# Patient Record
Sex: Male | Born: 1945
Health system: Southern US, Community
[De-identification: ages and names within clinical notes are randomized; demographics above are authoritative.]

## PROBLEM LIST (undated history)

## (undated) DIAGNOSIS — E785 Hyperlipidemia, unspecified: Secondary | ICD-10-CM

## (undated) DIAGNOSIS — T7840XA Allergy, unspecified, initial encounter: Secondary | ICD-10-CM

## (undated) DIAGNOSIS — C801 Malignant (primary) neoplasm, unspecified: Secondary | ICD-10-CM

## (undated) DIAGNOSIS — M797 Fibromyalgia: Secondary | ICD-10-CM

## (undated) DIAGNOSIS — N200 Calculus of kidney: Secondary | ICD-10-CM

## (undated) DIAGNOSIS — I2510 Atherosclerotic heart disease of native coronary artery without angina pectoris: Secondary | ICD-10-CM

## (undated) DIAGNOSIS — K219 Gastro-esophageal reflux disease without esophagitis: Secondary | ICD-10-CM

## (undated) DIAGNOSIS — G4730 Sleep apnea, unspecified: Secondary | ICD-10-CM

## (undated) DIAGNOSIS — H269 Unspecified cataract: Secondary | ICD-10-CM

## (undated) DIAGNOSIS — E349 Endocrine disorder, unspecified: Secondary | ICD-10-CM

## (undated) HISTORY — DX: Allergy, unspecified, initial encounter: T78.40XA

## (undated) HISTORY — DX: Unspecified cataract: H26.9

## (undated) HISTORY — DX: Endocrine disorder, unspecified: E34.9

## (undated) HISTORY — PX: EYE SURGERY: SHX253

## (undated) HISTORY — DX: Calculus of kidney: N20.0

## (undated) HISTORY — PX: CARDIAC CATHETERIZATION: SHX172

## (undated) HISTORY — DX: Malignant (primary) neoplasm, unspecified: C80.1

---

## 1989-03-11 HISTORY — PX: INGUINAL HERNIA REPAIR: SUR1180

## 1989-03-11 HISTORY — PX: KNEE ARTHROSCOPY: SUR90

## 1998-06-01 ENCOUNTER — Encounter: Payer: Self-pay | Admitting: Internal Medicine

## 2006-08-02 ENCOUNTER — Ambulatory Visit: Payer: Self-pay | Admitting: Internal Medicine

## 2006-08-02 LAB — CONVERTED CEMR LAB
ALT: 23 units/L (ref 0–40)
AST: 22 units/L (ref 0–37)
Albumin: 3.9 g/dL (ref 3.5–5.2)
Alkaline Phosphatase: 51 units/L (ref 39–117)
BUN: 19 mg/dL (ref 6–23)
Basophils Absolute: 0 10*3/uL (ref 0.0–0.1)
Basophils Relative: 0.9 % (ref 0.0–1.0)
CO2: 29 meq/L (ref 19–32)
Calcium: 9.4 mg/dL (ref 8.4–10.5)
Chloride: 106 meq/L (ref 96–112)
Cholesterol: 216 mg/dL (ref 0–200)
Creatinine, Ser: 1.3 mg/dL (ref 0.4–1.5)
Direct LDL: 130.6 mg/dL
Eosinophils Absolute: 0.3 10*3/uL (ref 0.0–0.6)
Eosinophils Relative: 5 % (ref 0.0–5.0)
GFR calc Af Amer: 72 mL/min
GFR calc non Af Amer: 59 mL/min
Glucose, Bld: 94 mg/dL (ref 70–99)
HCT: 43.3 % (ref 39.0–52.0)
HDL: 42.2 mg/dL (ref >39.0)
Hemoglobin: 15 g/dL (ref 13.0–17.0)
Lymphocytes Relative: 39.3 % (ref 12.0–46.0)
MCHC: 34.6 g/dL (ref 30.0–36.0)
MCV: 91.5 fL (ref 78.0–100.0)
Monocytes Absolute: 0.4 10*3/uL (ref 0.2–0.7)
Monocytes Relative: 7.8 % (ref 3.0–11.0)
Neutro Abs: 2.5 10*3/uL (ref 1.4–7.7)
Neutrophils Relative %: 47 % (ref 43.0–77.0)
PSA: 2.71 ng/mL (ref 0.10–4.00)
Platelets: 229 10*3/uL (ref 150–400)
Potassium: 4.8 meq/L (ref 3.5–5.1)
RBC: 4.73 M/uL (ref 4.22–5.81)
RDW: 11.5 % (ref 11.5–14.6)
Sodium: 140 meq/L (ref 135–145)
TSH: 3.85 microintl units/mL (ref 0.35–5.50)
Total Bilirubin: 0.8 mg/dL (ref 0.3–1.2)
Total CHOL/HDL Ratio: 5.1
Total Protein: 7.1 g/dL (ref 6.0–8.3)
Triglycerides: 204 mg/dL (ref 0–149)
VLDL: 41 mg/dL — ABNORMAL HIGH (ref 0–40)
WBC: 5.2 10*3/uL (ref 4.5–10.5)

## 2006-08-09 ENCOUNTER — Ambulatory Visit: Payer: Self-pay | Admitting: Internal Medicine

## 2006-09-06 ENCOUNTER — Ambulatory Visit: Payer: Self-pay | Admitting: Internal Medicine

## 2006-09-06 LAB — CONVERTED CEMR LAB: PSA: 1.96 ng/mL (ref 0.10–4.00)

## 2007-01-24 ENCOUNTER — Inpatient Hospital Stay (HOSPITAL_COMMUNITY): Admission: EM | Admit: 2007-01-24 | Discharge: 2007-01-26 | Payer: Self-pay | Admitting: Emergency Medicine

## 2007-05-11 ENCOUNTER — Encounter: Payer: Self-pay | Admitting: Internal Medicine

## 2007-06-19 ENCOUNTER — Encounter: Payer: Self-pay | Admitting: Internal Medicine

## 2007-07-02 ENCOUNTER — Encounter: Payer: Self-pay | Admitting: Internal Medicine

## 2007-07-12 HISTORY — PX: CYSTOSCOPY W/ STONE MANIPULATION: SHX1427

## 2007-08-12 LAB — HM COLONOSCOPY

## 2007-08-13 ENCOUNTER — Ambulatory Visit: Payer: Self-pay | Admitting: Internal Medicine

## 2007-08-13 DIAGNOSIS — N419 Inflammatory disease of prostate, unspecified: Secondary | ICD-10-CM | POA: Insufficient documentation

## 2007-09-12 ENCOUNTER — Encounter: Payer: Self-pay | Admitting: Internal Medicine

## 2008-03-26 ENCOUNTER — Ambulatory Visit: Payer: Self-pay | Admitting: Internal Medicine

## 2008-03-26 DIAGNOSIS — Z87442 Personal history of urinary calculi: Secondary | ICD-10-CM | POA: Insufficient documentation

## 2008-03-26 LAB — CONVERTED CEMR LAB
Blood in Urine, dipstick: NEGATIVE
Ketones, urine, test strip: NEGATIVE
Nitrite: NEGATIVE
Urobilinogen, UA: 0.2

## 2008-03-27 LAB — CONVERTED CEMR LAB
ALT: 23 units/L (ref 0–53)
Alkaline Phosphatase: 60 units/L (ref 39–117)
Bilirubin, Direct: 0.1 mg/dL (ref 0.0–0.3)
CO2: 29 meq/L (ref 19–32)
GFR calc Af Amer: 87 mL/min
Glucose, Bld: 98 mg/dL (ref 70–99)
HDL: 49.1 mg/dL (ref >39.0)
LDL Cholesterol: 107 mg/dL — ABNORMAL HIGH (ref 0–99)
Lymphocytes Relative: 38.9 % (ref 12.0–46.0)
Monocytes Relative: 8.7 % (ref 3.0–12.0)
Platelets: 216 10*3/uL (ref 150–400)
Potassium: 4.6 meq/L (ref 3.5–5.1)
RDW: 12 % (ref 11.5–14.6)
Sodium: 142 meq/L (ref 135–145)
Total Bilirubin: 0.9 mg/dL (ref 0.3–1.2)
Total CHOL/HDL Ratio: 3.7
Total Protein: 7.3 g/dL (ref 6.0–8.3)
Triglycerides: 131 mg/dL (ref 0–149)
VLDL: 26 mg/dL (ref 0–40)

## 2008-04-02 ENCOUNTER — Telehealth: Payer: Self-pay | Admitting: Internal Medicine

## 2008-04-11 ENCOUNTER — Ambulatory Visit: Payer: Self-pay | Admitting: Internal Medicine

## 2008-04-11 LAB — CONVERTED CEMR LAB
LH: 1.7 milliintl units/mL
Prolactin: 5.5 ng/mL

## 2008-04-21 LAB — CONVERTED CEMR LAB
Sex Hormone Binding: 9 nmol/L — ABNORMAL LOW (ref 13–71)
Testosterone: 145.52 ng/dL — ABNORMAL LOW (ref 350–890)

## 2008-04-23 ENCOUNTER — Telehealth: Payer: Self-pay | Admitting: Internal Medicine

## 2008-04-28 ENCOUNTER — Ambulatory Visit: Payer: Self-pay | Admitting: Endocrinology

## 2008-04-28 DIAGNOSIS — N320 Bladder-neck obstruction: Secondary | ICD-10-CM | POA: Insufficient documentation

## 2008-04-28 DIAGNOSIS — E349 Endocrine disorder, unspecified: Secondary | ICD-10-CM | POA: Insufficient documentation

## 2008-04-28 HISTORY — DX: Endocrine disorder, unspecified: E34.9

## 2008-05-01 ENCOUNTER — Encounter: Payer: Self-pay | Admitting: Endocrinology

## 2009-09-16 ENCOUNTER — Ambulatory Visit: Payer: Self-pay | Admitting: Family Medicine

## 2009-09-16 LAB — CONVERTED CEMR LAB
Glucose, Urine, Semiquant: NEGATIVE
Nitrite: NEGATIVE
Protein, U semiquant: NEGATIVE
WBC Urine, dipstick: NEGATIVE
pH: 5

## 2009-09-17 LAB — CONVERTED CEMR LAB
Basophils Absolute: 0 10*3/uL (ref 0.0–0.1)
Eosinophils Absolute: 0.1 10*3/uL (ref 0.0–0.7)
HCT: 39.2 % (ref 39.0–52.0)
Hemoglobin: 13 g/dL (ref 13.0–17.0)
Lymphs Abs: 2 10*3/uL (ref 0.7–4.0)
MCHC: 33.1 g/dL (ref 30.0–36.0)
MCV: 94 fL (ref 78.0–100.0)
Monocytes Absolute: 0.5 10*3/uL (ref 0.1–1.0)
Neutro Abs: 3.3 10*3/uL (ref 1.4–7.7)
Platelets: 223 10*3/uL (ref 150.0–400.0)
RDW: 11.8 % (ref 11.5–14.6)

## 2010-04-22 ENCOUNTER — Ambulatory Visit: Payer: Self-pay | Admitting: Internal Medicine

## 2010-04-22 DIAGNOSIS — G4700 Insomnia, unspecified: Secondary | ICD-10-CM | POA: Insufficient documentation

## 2010-04-23 LAB — CONVERTED CEMR LAB
ALT: 22 units/L (ref 0–53)
AST: 22 units/L (ref 0–37)
Albumin: 3.9 g/dL (ref 3.5–5.2)
BUN: 18 mg/dL (ref 6–23)
Basophils Absolute: 0 10*3/uL (ref 0.0–0.1)
Chloride: 104 meq/L (ref 96–112)
Cholesterol: 188 mg/dL (ref 0–200)
Eosinophils Relative: 7.4 % — ABNORMAL HIGH (ref 0.0–5.0)
Glucose, Bld: 100 mg/dL — ABNORMAL HIGH (ref 70–99)
HCT: 39.3 % (ref 39.0–52.0)
Hemoglobin: 13.6 g/dL (ref 13.0–17.0)
Lymphs Abs: 1.6 10*3/uL (ref 0.7–4.0)
MCV: 92.8 fL (ref 78.0–100.0)
Monocytes Absolute: 0.4 10*3/uL (ref 0.1–1.0)
Monocytes Relative: 9.2 % (ref 3.0–12.0)
Neutro Abs: 1.7 10*3/uL (ref 1.4–7.7)
PSA: 2.98 ng/mL (ref 0.10–4.00)
Platelets: 209 10*3/uL (ref 150.0–400.0)
Potassium: 4.8 meq/L (ref 3.5–5.1)
RDW: 12.9 % (ref 11.5–14.6)
Sodium: 137 meq/L (ref 135–145)
Total Bilirubin: 0.6 mg/dL (ref 0.3–1.2)

## 2010-08-12 NOTE — Assessment & Plan Note (Signed)
Summary: CPX (PT WILL COME IN FASTING) - FLU SHOT // RS--Rm 13   Vital Signs:  Patient profile:   65 year old male Height:      71 inches Weight:      201 pounds BMI:     28.14 Temp:     97.7 degrees F oral Pulse rate:   72 / minute Pulse rhythm:   regular Resp:     16 per minute BP sitting:   140 / 90  (right arm) Cuff size:   large  Vitals Entered By: Mervin Kung CMA Duncan Dull) (April 22, 2010 8:43 AM) CC: Rm 13  Pt here for fasting physical. Is Patient Diabetic? No Pain Assessment Patient in pain? no        Primary Care Provider:  Birdie Sons MD  CC:  Rm 13  Pt here for fasting physical..  History of Present Illness: Here for Medicare AWV:  1.   Risk factors based on Past M, S, F history:---see list 2.   Physical Activities: ---exercising occasionally 3.   Depression/mood: ---denies 4.   Hearing: patient denies any complaints. 5.   ADL's: patient is able to perform all activities of daily living. 6.   Fall Risk: no concerns. 7.   Home Safety: no concerns. 8.   Height, weight, &visual acuity: patient has regular eye exams. Denies any trouble with vision. 9.   Counseling: advise regular exercise. Maintain normal weight. 10.   Labs ordered based on risk factors: see orders. 11.           Referral Coordination: None necessary. 12.           Care Plan: regular exercise.:  13.            Cognitive Assessment: patient alert and oriented. Motor and sensory functions are intact. Patient's cognitive functions are normal.   Current Problems:  BLADDER OUTLET OBSTRUCTION (ICD-596.0)---few sxs currently TESTOSTERONE DEFICIENCY (ICD-257.2)---has declined treatment NEPHROLITHIASIS, HX OF (ICD-V13.01)--no recurrence PROSTATITIS, RECURRENT (ICD-601.9)---no recent recurrence    Current Problems (verified): 1)  Bladder Outlet Obstruction  (ICD-596.0) 2)  Testosterone Deficiency  (ICD-257.2) 3)  Preventive Health Care  (ICD-V70.0) 4)  Nephrolithiasis, Hx of   (ICD-V13.01) 5)  Prostatitis, Recurrent  (ICD-601.9)  Current Medications (verified): 1)  None  Allergies (verified): No Known Drug Allergies  Past History:  Past Medical History: Last updated: 03/26/2008 recurrent prostatitis Nephrolithiasis, hx of -attempt at retrieval  Past Surgical History: Last updated: 08/13/2007 Inguinal herniorrhaphy  Family History: Last updated: May 18, 2008 father deceased age 6 from MI Family History Diabetes 1st degree relative--father, mother mother deceased age 18 of MI 3 brothers-one with arthritis, others are healthy two sisters--one with diabetes, other is healthy no testosterone problems  Social History: Last updated: 03/26/2008 Occupation:owns own payroll service---now retired Married 2 healthy children  Risk Factors: Smoking Status: never (08/13/2007)   Impression & Recommendations:  Problem # 1:  PREVENTIVE HEALTH CARE (ICD-V70.0)  Orders: Venipuncture (81191) Medicare -1st Annual Wellness Visit [phone]) Specimen Handling (56213) TLB-Lipid Panel (80061-LIPID) TLB-BMP (Basic Metabolic Panel-BMET) (80048-METABOL) TLB-CBC Platelet - w/Differential (85025-CBCD) TLB-Hepatic/Liver Function Pnl (80076-HEPATIC) TLB-PSA (Prostate Specific Antigen) (84153-PSA)  Problem # 2:  BLADDER OUTLET OBSTRUCTION (ICD-596.0) still has nocturia x 2-3 does not want treatment  Problem # 3:  NEPHROLITHIASIS, HX OF (ICD-V13.01) no recurrence  Problem # 4:  INSOMNIA-SLEEP DISORDER-UNSPEC (ICD-780.52)  Other Orders: Flu Vaccine 65yrs + MEDICARE PATIENTS (Y8657) Administration Flu vaccine - MCR (G0008) Pneumococcal Vaccine (84696) Admin 1st Vaccine (29528)  Current  Allergies (reviewed today): No known allergies    Immunizations Administered:  Pneumonia Vaccine:    Vaccine Type: Pneumovax (Medicare)    Site: right deltoid    Mfr: Merck    Dose: 0.5 ml    Route: IM    Given by: Mervin Kung CMA (AAMA)    Exp. Date: 08/01/2011     Lot #: [id number]     Flu Vaccine Consent Questions     Do you have a history of severe allergic reactions to this vaccine? no    Any prior history of allergic reactions to egg and/or gelatin? no    Do you have a sensitivity to the preservative Thimersol? no    Do you have a past history of Guillan-Barre Syndrome? no    Do you currently have an acute febrile illness? no    Have you ever had a severe reaction to latex? no    Vaccine information given and explained to patient? yes    Are you currently pregnant? no    Lot Number:[id number]   Exp Date:01/08/2011   Site Given  Left Deltoid IM.  Nicki Guadalajara Fergerson CMA Duncan Dull)  April 22, 2010 8:51 AM   Physical Exam General Appearance: well developed, well nourished, no acute distress Eyes: conjunctiva and lids normal, PERRL, EOMI,  Ears, Nose, Mouth, Throat: TM clear, nares clear, oral exam WNL Neck: supple, no lymphadenopathy, no thyromegaly, no JVD Respiratory: clear to auscultation and percussion, respiratory effort normal Cardiovascular: regular rate and rhythm, S1-S2, no murmur, rub or gallop, no bruits, peripheral pulses normal and symmetric, no cyanosis, clubbing, edema or varicosities Chest: no scars, masses, tenderness; no asymmetry, skin changes,  Gastrointestinal: soft, non-tender; no hepatosplenomegaly, masses; active bowel sounds all quadrants, ; no masses, tenderness, hemorrhoids  Genitourinary: no hernia,  or prostate enlargement Lymphatic: no cervical, axillary or inguinal adenopathy Musculoskeletal: gait normal, muscle tone and strength WNL, no joint swelling, effusions, discoloration, crepitus  Skin: clear, good turgor, color WNL, no rashes, lesions, or ulcerations Neurologic: normal mental status, normal reflexes, normal strength, sensation, and motion Psychiatric: alert; oriented to person, place and time Other Exam:

## 2010-08-12 NOTE — Assessment & Plan Note (Signed)
Summary: low abd pain/dm   Vital Signs:  Patient profile:   65 year old male Temp:     97.9 degrees F oral BP sitting:   150 / 102  (left arm) Cuff size:   regular  Vitals Entered By: Sid Falcon LPN (September 16, 8117 2:44 PM) CC: Lower abd pain X 2 days, Abdominal Pain   History of Present Illness: Acute visit. Abdominal pain which started a few days ago.  Recent history is that he had kidney stone left side 3.5 mm February 22. Went to emergency room and eventually passed this 48 hours later.  patient doing well until Monday he did a lot of yard work. By the next day had some bilateral abdominal and back pain left side somewhat greater than right. He has pain that is exacerbated by deep breathing and movement. Slight burning with urination. No radiculopathy symptoms. Denies any fever or chills. Has taken Motrin and leftover oxycodone without much improvement. No nausea or vomiting.       This is a 65 year old man who presents with Abdominal Pain.  The patient denies nausea, vomiting, diarrhea, constipation, melena, hematochezia, and anorexia.  The location of the pain is right lower quadrant and left lower quadrant.  The pain is described as intermittent, dull, and radiating to the back.  The patient denies the following symptoms: fever, weight loss, and dark urine.  The pain is worse with movement.    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  Teeth in good repair. Neck:  No deformities, masses, or tenderness noted. Lungs:  Normal respiratory effort, chest expands symmetrically. Lungs are clear to auscultation, no crackles or wheezes. Heart:  Normal rate and regular rhythm. S1 and S2 normal without gallop, murmur, click, rub or other extra sounds. Abdomen:  soft with minimal tenderness left and right lower quadrants to deep palpation. No masses palpated. No guarding or rebound. No organomegaly noted.normal bowel sounds.   Msk:  No low back tenderness. Extremities:  No clubbing, cyanosis, edema, or deformity noted with normal full range of motion of all joints.  Full ROM hips. Neurologic:  strength normal in all extremities, gait normal, and DTRs symmetrical and normal.     Impression & Recommendations:  Problem # 1:  ABDOMINAL PAIN (ICD-789.00) Assessment New ?musculoskeletal.  urine dipstick normal.  Check CBC.   Orders: UA Dipstick w/o Micro (manual) (14782) Venipuncture (95621) TLB-CBC Platelet - w/Differential (85025-CBCD)  Patient Instructions: 1)  Continue Motrin or Aleve for symptomatic relief. Follow up immediately if you develop any fever or worsening pain.  Laboratory Results   Urine Tests    Routine Urinalysis   Color: lt. yellow Appearance: Clear Glucose: negative   (Normal Range: Negative) Bilirubin: negative   (Normal Range: Negative) Ketone: negative   (Normal Range: Negative) Spec. Gravity: 1.015   (  Normal Range: 1.003-1.035) Blood: negative   (Normal Range: Negative) pH: 5.0   (Normal Range: 5.0-8.0) Protein: negative   (Normal Range: Negative) Urobilinogen: 0.2   (Normal Range: 0-1) Nitrite: negative   (Normal Range:  Negative) Leukocyte Esterace: negative   (Normal Range: Negative)    Comments: Sid Falcon LPN  September 16, 452 3:28 PM

## 2010-11-05 ENCOUNTER — Telehealth: Payer: Self-pay | Admitting: *Deleted

## 2010-11-05 NOTE — Telephone Encounter (Signed)
Pt had health screening for wife's insurance and his BP was running 142/94.  Has had some headaches lately and wonders if he needs a diuretic????

## 2010-11-07 NOTE — Telephone Encounter (Signed)
Have him monitor bp at home for 2 weeks with bp cuf that goes around upper arm. Report #s in two weeks. Unlikely that headaches are related to these bp numbers

## 2010-11-08 NOTE — Telephone Encounter (Signed)
LMTCB

## 2010-11-08 NOTE — Telephone Encounter (Signed)
Pt notified of Dr. Marliss Coots recommendations.

## 2010-11-23 NOTE — Op Note (Signed)
Richard Mahoney, Richard Mahoney              ACCOUNT NO.:  [id number]   MEDICAL RECORD NO.:  [id number]          PATIENT TYPE:  INP   LOCATION:  1405                         FACILITY:  [facility]   PHYSICIAN:  Jamison Neighbor, M.D.  DATE OF BIRTH:  1946-05-08   DATE OF PROCEDURE:  01/25/2007  DATE OF DISCHARGE:  01/26/2007                               OPERATIVE REPORT   PREOPERATIVE DIAGNOSIS:  Right ureteral calculus.   POSTOPERATIVE DIAGNOSIS:  Right ureteral calculus.   OPERATION PERFORMED:  Cystoscopy, right retrograde, right ureteroscopy  with dilation of ureteral stricture, right double-J catheter insertion.   SURGEON:  Jamison Neighbor, M.D.   ANESTHESIA:  General.   COMPLICATIONS:  None.   DRAINS:  A 6 French x 26 cm catheter.   BRIEF HISTORY:  This 65 year old male is a patient of Dr. Earlene Plater.  He  ended up in the emergency room last night and was admitted to the  hospital by Valetta Fuller, M.D. for treatment of what was described as  a 4.7 mm stone.  The patient is still having pain and as far as he  knows, has not passed the stone.  The patient has requested that  ureteroscopy be performed to determine if he has a stone present and to  remove it if possible.  Dr. Earlene Plater is not available and the patient is  willing to have this done.  I have discussed this with him and am happy  to provide this service because he still has renal colic.  He  understands the risks and benefits of the procedure and gave full  informed consent.   DESCRIPTION OF PROCEDURE:  After successful induction of general  anesthesia, the patient was placed in the dorsal lithotomy position,  prepped with Betadine, draped in the usual sterile fashion.  Cystoscopy  was performed, urethra was visualized in its entirety and found to be  normal.  Beyond the verumontanum, no significant prostatic enlargement  could be identified.  The bladder was inspected, no tumors or stones  could be seen.  A retrograde study was  performed on the right hand side.  A 6 French ureteral catheter was inserted into the opening.  Contrast  went up what appeared to be a pretty normal-appearing ureter.  The  patient had a little narrowed spot in the midureter but no real filling  defect could be identified.  There was no real hydronephrosis in the  upper portion of the collecting system although it was a little bit  wider just above that narrowed area.  The collecting system itself was  free of any obvious filling defect.  The images, however, were somewhat  limited on this particular fluoroscope.  The catheter was used to  advance a guidewire up into the kidney.  The ureteral access sheath was  used to dilate both the ureteral orifice which was somewhat tight as  well as midureteral area.  The ureteroscope was inserted and the entire  ureter was visualized.  The short ureteroscope could be advanced to a  point just beyond that strictured area.  No stone could be seen  and it  was felt that a longer ureteroscope should advance all the way up into  the kidney to determine if the stone had ascended superiorly.  The  ureteroscope was advanced and was taken all the way into the renal  pelvis while the rigid ureteroscope could not be used to visualize the  lower pole, no stones could be seen anywhere within the collecting  system that was visible.  As the ureteroscope was withdrawn, the  proximal ureter was identified.  In the midureter was an area where the  mucosa was somewhat split.  This was likely to be the area of  stricturing.  The proximal ureter was normal and had opened up nicely.  The wire was left in place, a 6 French x 26 cm J-J with no string was  then advanced over the guidewire, allowed to curl normally within the  collecting system.  The bladder was inspected.  No tumors or stones  could be seen.  It is not clear when the patient passed the stone;  however, it is clear that the patient does not have a stone in  the  ureter at this time as this was visualized x2.  The J-J appeared to coil  normally.  We will get a KUB to check position and check position and  see if there is stone material anywhere within the collecting system and  in particular to see if it may have migrated into the lower pole of the  kidney.      Jamison Neighbor, M.D.  Electronically Signed     RJE/MEDQ  D:  01/25/2007  T:  01/26/2007  Job:  [id number]

## 2010-11-26 NOTE — Discharge Summary (Signed)
Richard Mahoney, Richard Mahoney              ACCOUNT NO.:  [id number]   MEDICAL RECORD NO.:  [id number]          PATIENT TYPE:  INP   LOCATION:  1405                         FACILITY:  [facility]   PHYSICIAN:  Ronald L. Earlene Plater, M.D.  DATE OF BIRTH:  06-22-1946   DATE OF ADMISSION:  01/24/2007  DATE OF DISCHARGE:  01/26/2007                               DISCHARGE SUMMARY   DISCHARGE DIAGNOSIS:  Probable past ureteral calculus.   PRINCIPLE PROCEDURE:  Cystoscopy and dilation of ureteral stricture  performed on January 25, 2007.   HISTORY:  This patient of Dr. Earlene Plater' was admitted to the hospital by Dr.  Isabel Caprice because of problem with pain.  The patient was thought to have a  stone.  Dr. Earlene Plater was unable to operate on the patient and requested  that I perform the operation.  Since the patient had persistent pain he  agreed to undergo a ureteroscopy.   On January 25, 2007, the patient had a right ureteroscopy.  We found his  ureteral stricture but no stone could be identified.  Followup KUB did  not show a stone.  Dr. Earlene Plater made arrangements for the patient to stay  over night and is discharged the following day.  The patient did feel  quite a bit better by postoperative day 1.  He was sent home and will  have a followup CT scan with Dr. Earlene Plater.      Jamison Neighbor, M.D.      Ronald L. Earlene Plater, M.D.  Electronically Signed    RJE/MEDQ  D:  02/01/2007  T:  02/02/2007  Job:  [id number]

## 2011-01-03 ENCOUNTER — Ambulatory Visit (INDEPENDENT_AMBULATORY_CARE_PROVIDER_SITE_OTHER): Payer: Medicare Other | Admitting: Internal Medicine

## 2011-01-03 ENCOUNTER — Encounter: Payer: Self-pay | Admitting: Internal Medicine

## 2011-01-03 VITALS — BP 132/90 | Temp 97.8°F | Wt 198.0 lb

## 2011-01-03 DIAGNOSIS — R071 Chest pain on breathing: Secondary | ICD-10-CM

## 2011-01-03 DIAGNOSIS — R0789 Other chest pain: Secondary | ICD-10-CM

## 2011-01-03 NOTE — Patient Instructions (Signed)
Annual clinical examination as scheduled  Call or return to clinic prn if these symptoms worsen or fail to improve as anticipated.

## 2011-01-03 NOTE — Progress Notes (Signed)
  Subjective:    Patient ID: Richard Mahoney, male    DOB: 25-Jun-1946, 65 y.o.   MRN: [id number]  HPI  65 year old patient presents with a two-week history of left anterior chest pain. This began after prolonged vigorous outdoor yard work. He describes achiness and tenderness in the left anterior chest wall area pain is aggravated by movement and pressure. At times he remains very active physically with golf and there is no real exertional component. He denies any associated symptoms such as diaphoresis or nausea;  pain is localized to the left anterior chest area. He has no cardiac risk factors    Review of Systems  Constitutional: Negative for fever, chills, appetite change and fatigue.  HENT: Negative for hearing loss, ear pain, congestion, sore throat, trouble swallowing, neck stiffness, dental problem, voice change and tinnitus.   Eyes: Negative for pain, discharge and visual disturbance.  Respiratory: Negative for cough, chest tightness, wheezing and stridor.   Cardiovascular: Positive for chest pain. Negative for palpitations and leg swelling.  Gastrointestinal: Negative for nausea, vomiting, abdominal pain, diarrhea, constipation, blood in stool and abdominal distention.  Genitourinary: Negative for urgency, hematuria, flank pain, discharge, difficulty urinating and genital sores.  Musculoskeletal: Negative for myalgias, back pain, joint swelling, arthralgias and gait problem.  Skin: Negative for rash.  Neurological: Negative for dizziness, syncope, speech difficulty, weakness, numbness and headaches.  Hematological: Negative for adenopathy. Does not bruise/bleed easily.  Psychiatric/Behavioral: Negative for behavioral problems and dysphoric mood. The patient is not nervous/anxious.        Objective:   Physical Exam  Constitutional: He is oriented to person, place, and time. He appears well-developed.       Repeat blood pressure 120/78  HENT:  Head: Normocephalic.  Right Ear:  External ear normal.  Left Ear: External ear normal.  Eyes: Conjunctivae and EOM are normal.  Neck: Normal range of motion.  Cardiovascular: Normal rate and normal heart sounds.   Pulmonary/Chest: Effort normal and breath sounds normal. No respiratory distress. He has no wheezes. He exhibits tenderness.       Tenderness was present in the left upper anterior chest wall area beneath the clavicle and just medial to the shoulder region  Abdominal: Bowel sounds are normal.  Musculoskeletal: Normal range of motion. He exhibits no edema and no tenderness.  Neurological: He is alert and oriented to person, place, and time.  Psychiatric: He has a normal mood and affect. His behavior is normal.          Assessment & Plan:   Chest wall pain. The patient was reassured. An EKG was reviewed. He will take Tylenol and/or Advil if needed. CPX as scheduled

## 2011-04-25 LAB — URINALYSIS, ROUTINE W REFLEX MICROSCOPIC
Glucose, UA: NEGATIVE
Hgb urine dipstick: NEGATIVE
Ketones, ur: NEGATIVE
Protein, ur: NEGATIVE
Urobilinogen, UA: 0.2

## 2011-04-25 LAB — DIFFERENTIAL
Basophils Absolute: 0
Basophils Relative: 1
Eosinophils Absolute: 0.1
Monocytes Relative: 9
Neutro Abs: 5.7
Neutrophils Relative %: 73

## 2011-04-25 LAB — CBC
MCHC: 34.6
MCV: 89.9
Platelets: 178
RBC: 3.85 — ABNORMAL LOW
RDW: 12.4

## 2011-04-25 LAB — BASIC METABOLIC PANEL
BUN: 16
CO2: 25
Calcium: 8 — ABNORMAL LOW
Chloride: 102
Creatinine, Ser: 1.74 — ABNORMAL HIGH
Glucose, Bld: 114 — ABNORMAL HIGH

## 2011-05-30 ENCOUNTER — Encounter: Payer: Self-pay | Admitting: Internal Medicine

## 2011-05-30 ENCOUNTER — Ambulatory Visit (INDEPENDENT_AMBULATORY_CARE_PROVIDER_SITE_OTHER): Payer: Medicare Other | Admitting: Internal Medicine

## 2011-05-30 VITALS — BP 122/82 | HR 74 | Temp 98.2°F | Ht 71.5 in | Wt 202.0 lb

## 2011-05-30 DIAGNOSIS — E785 Hyperlipidemia, unspecified: Secondary | ICD-10-CM

## 2011-05-30 DIAGNOSIS — Z8249 Family history of ischemic heart disease and other diseases of the circulatory system: Secondary | ICD-10-CM

## 2011-05-30 DIAGNOSIS — R739 Hyperglycemia, unspecified: Secondary | ICD-10-CM

## 2011-05-30 DIAGNOSIS — Z87442 Personal history of urinary calculi: Secondary | ICD-10-CM

## 2011-05-30 DIAGNOSIS — Z23 Encounter for immunization: Secondary | ICD-10-CM

## 2011-05-30 DIAGNOSIS — E291 Testicular hypofunction: Secondary | ICD-10-CM

## 2011-05-30 DIAGNOSIS — R7309 Other abnormal glucose: Secondary | ICD-10-CM

## 2011-05-30 DIAGNOSIS — N320 Bladder-neck obstruction: Secondary | ICD-10-CM

## 2011-05-30 DIAGNOSIS — Z0000 Encounter for general adult medical examination without abnormal findings: Secondary | ICD-10-CM

## 2011-05-30 LAB — BASIC METABOLIC PANEL
Chloride: 104 mEq/L (ref 96–112)
GFR: 65.94 mL/min (ref >60.00)
Glucose, Bld: 92 mg/dL (ref 70–99)
Potassium: 4.8 mEq/L (ref 3.5–5.1)
Sodium: 140 mEq/L (ref 135–145)

## 2011-05-30 LAB — LIPID PANEL
Total CHOL/HDL Ratio: 4
Triglycerides: 153 mg/dL — ABNORMAL HIGH (ref 0.0–149.0)

## 2011-05-30 NOTE — Progress Notes (Signed)
Subjective:    Richard Mahoney is a 65 y.o. male who presents for Medicare Annual/Subsequent preventive examination.   Preventive Screening-Counseling & Management  Tobacco History  Smoking status  . Never Smoker   Smokeless tobacco  . Never Used    Problems Prior to Visit 1.   Current Problems (verified) Patient Active Problem List  Diagnoses  . TESTOSTERONE DEFICIENCY  . BLADDER OUTLET OBSTRUCTION  . PROSTATITIS, RECURRENT  . INSOMNIA-SLEEP DISORDER-UNSPEC  . NEPHROLITHIASIS, HX OF    Medications Prior to Visit No current outpatient prescriptions on file prior to visit.    Current Medications (verified) No current outpatient prescriptions on file.     Allergies (verified) Review of patient's allergies indicates no known allergies.   PAST HISTORY  Family History Family History  Problem Relation Age of Onset  . Diabetes Mother   . Heart attack Mother   . Diabetes Father   . Heart attack Father   . Diabetes Sister   . Arthritis Brother     Social History History  Substance Use Topics  . Smoking status: Never Smoker   . Smokeless tobacco: Never Used  . Alcohol Use: No    Are there smokers in your home (other than you)?  No  Risk Factors Current exercise habits: The patient does not participate in regular exercise at present.  Dietary issues discussed: tries to follow a healthy diet   Cardiac risk factors: advanced age (older than 88 for men, 81 for women).  Depression Screen (Note: if answer to either of the following is "Yes", a more complete depression screening is indicated)   Q1: Over the past two weeks, have you felt down, depressed or hopeless? No  Q2: Over the past two weeks, have you felt little interest or pleasure in doing things? No  Activities of Daily Living In your present state of health, do you have any difficulty performing the following activities?:  Driving? No Managing money?  No Feeding yourself? No Getting from bed to  chair? No  Hearing Difficulties: No   Do you feel that you have a problem with memory? No  Cognitive Testing  Alert? Yes  Normal Appearance?Yes  Oriented to person? Yes  Place? Yes  List the Names of Other Physician/Practitioners you currently use: 1.    Indicate any recent Medical Services you may have received from other than [facility] providers in the past year (date may be approximate).  Immunization History  Administered Date(s) Administered  . Influenza Split 05/30/2011  . Influenza Whole 03/26/2008, 04/22/2010  . Pneumococcal Polysaccharide 04/22/2010  . Td 07/12/1995, 03/26/2008  . Zoster 03/26/2008    Screening Tests Health Maintenance  Topic Date Due  . Influenza Vaccine  04/11/2011  . Colonoscopy  08/11/2017  . Tetanus/tdap  03/26/2018  . Pneumococcal Polysaccharide Vaccine Age 58 And Over  Completed  . Zostavax  Completed    All answers were reviewed with the patient and necessary referrals were made:  [name], MD   05/30/2011   History reviewed: allergies, current medications, past family history, past medical history, past social history, past surgical history and problem list  Review of Systems   patient denies chest pain, shortness of breath, orthopnea. Denies lower extremity edema, abdominal pain, change in appetite, change in bowel movements. Patient denies rashes, musculoskeletal complaints. No other specific complaints in a complete review of systems.     Objective:      Blood pressure 122/82, pulse 74, temperature 98.2 F (36.8 C), temperature source  Oral, height 5' 11.5" (1.816 m), weight 202 lb (91.627 kg). Body mass index is 27.78 kg/(m^2).   well-developed well-nourished male in no acute distress. HEENT exam atraumatic, normocephalic, neck supple without jugular venous distention. Chest clear to auscultation cardiac exam S1-S2 are regular. Abdominal exam overweight with bowel sounds, soft and nontender. Extremities no edema. Neurologic  exam is alert with a normal gait.      Assessment:  Well visit        Plan:  Health Maint UTD No intervention necessary---will check labs today   During the course of the visit the patient was educated and counseled about appropriate screening and preventive services including:    Pneumococcal vaccine   Influenza vaccine  Td vaccine  Prostate cancer screening  Colorectal cancer screening  Diet review for nutrition referral? Yes ____  Not Indicated ____   Patient Instructions (the written plan) was given to the patient.  Medicare Attestation I have personally reviewed: The patient's medical and social history Their use of alcohol, tobacco or illicit drugs Their current medications and supplements The patient's functional ability including ADLs,fall risks, home safety risks, cognitive, and hearing and visual impairment Diet and physical activities Evidence for depression or mood disorders  The patient's weight, height, BMI, and visual acuity have been recorded in the chart.  I have made referrals, counseling, and provided education to the patient based on review of the above and I have provided the patient with a written personalized care plan for preventive services.     [name], MD   05/30/2011

## 2011-08-05 DIAGNOSIS — H2589 Other age-related cataract: Secondary | ICD-10-CM | POA: Diagnosis not present

## 2011-08-12 HISTORY — PX: CATARACT EXTRACTION W/ INTRAOCULAR LENS IMPLANT: SHX1309

## 2011-08-25 DIAGNOSIS — H2589 Other age-related cataract: Secondary | ICD-10-CM | POA: Diagnosis not present

## 2011-08-25 DIAGNOSIS — H52209 Unspecified astigmatism, unspecified eye: Secondary | ICD-10-CM | POA: Diagnosis not present

## 2011-09-09 DIAGNOSIS — L821 Other seborrheic keratosis: Secondary | ICD-10-CM | POA: Diagnosis not present

## 2011-11-23 DIAGNOSIS — H5200 Hypermetropia, unspecified eye: Secondary | ICD-10-CM | POA: Diagnosis not present

## 2011-11-23 DIAGNOSIS — H52209 Unspecified astigmatism, unspecified eye: Secondary | ICD-10-CM | POA: Diagnosis not present

## 2011-11-23 DIAGNOSIS — H2589 Other age-related cataract: Secondary | ICD-10-CM | POA: Diagnosis not present

## 2011-11-23 DIAGNOSIS — T8529XA Other mechanical complication of intraocular lens, initial encounter: Secondary | ICD-10-CM | POA: Diagnosis not present

## 2012-02-10 DIAGNOSIS — L821 Other seborrheic keratosis: Secondary | ICD-10-CM | POA: Diagnosis not present

## 2012-02-10 DIAGNOSIS — L819 Disorder of pigmentation, unspecified: Secondary | ICD-10-CM | POA: Diagnosis not present

## 2012-02-27 ENCOUNTER — Telehealth: Payer: Self-pay | Admitting: Internal Medicine

## 2012-02-27 NOTE — Telephone Encounter (Signed)
Caller: [name]/Patient; Patient Name: Richard Mahoney; PCP: Birdie Sons; Best Callback Phone Number: [phone] Pt is calling about having chest pain and arm pain x the past month and 1/2. Pt has it this am but he states it is mild and describes it as a nagging heaviness. He noticed it after he woke up so he has had it x 1 1/2 hours.  At times his left arm is tingling. Not this am.  Pt states he had this one year ago (Feb)/was seen by MD at the office - all his tests and EKG were normal. Pt states he does have fibromyalgia in parts of his body which does flare up. Pt denies SOB. He states he can press on an area in between his heart and his shoulder that is painful to touch. Pt does not want to go to the ED for evaluation/he wants to come to the office. Rn called the back line to have office nurse Tim Lair) check with Dr. Cato Mulligan who verified that pt could be seen tomorrow. Office scheduled pt at 09:30 with Dr. Cato Mulligan. Pt instructed by RN that is chest pain worsens or other sympoms develop he is to go to the ED

## 2012-02-28 ENCOUNTER — Encounter: Payer: Self-pay | Admitting: Internal Medicine

## 2012-02-28 ENCOUNTER — Ambulatory Visit (INDEPENDENT_AMBULATORY_CARE_PROVIDER_SITE_OTHER): Payer: Medicare Other | Admitting: Internal Medicine

## 2012-02-28 VITALS — BP 138/82 | HR 68 | Temp 97.9°F | Wt 198.0 lb

## 2012-02-28 DIAGNOSIS — R079 Chest pain, unspecified: Secondary | ICD-10-CM | POA: Diagnosis not present

## 2012-02-28 MED ORDER — OMEPRAZOLE 20 MG PO CPDR: 20.0000 mg | DELAYED_RELEASE_CAPSULE | Freq: Every day | ORAL | Status: DC

## 2012-02-28 NOTE — Progress Notes (Signed)
Patient ID: Richard Mahoney, male   DOB: [birth year], 66 y.o.   MRN: [id number] One month hx of chest pain- Left sided Not exertional Exertion may help Describes as a pressure- occurs at rest and radiates to left arm and left neck (says left side of neck feels stiff). Pain is daily and usually all day and all night.   He has monitored home bps: 110-153/70s.   No sob, no nausea. Occasionally he has epigastric discomfort  Past Medical History  Diagnosis Date  . Nephrolithiasis     History   Social History  . Marital Status: Married    Spouse Name: N/A    Number of Children: N/A  . Years of Education: N/A   Occupational History  . Not on file.   Social History Main Topics  . Smoking status: Never Smoker   . Smokeless tobacco: Never Used  . Alcohol Use: No  . Drug Use: No  . Sexually Active: Not on file   Other Topics Concern  . Not on file   Social History Narrative  . No narrative on file    Past Surgical History  Procedure Date  . Hernia repair     inguinal     Family History  Problem Relation Age of Onset  . Diabetes Mother   . Heart attack Mother   . Diabetes Father   . Heart attack Father   . Diabetes Sister   . Arthritis Brother     No Known Allergies  Current Outpatient Prescriptions on File Prior to Visit  Medication Sig Dispense Refill  . omeprazole (PRILOSEC) 20 MG capsule Take 1 capsule (20 mg total) by mouth daily.  90 capsule  3     patient denies chest pain, shortness of breath, orthopnea. Denies lower extremity edema, abdominal pain, change in appetite, change in bowel movements. Patient denies rashes, musculoskeletal complaints. No other specific complaints in a complete review of systems.   BP 138/82  Pulse 68  Temp 97.9 F (36.6 C) (Oral)  Wt 198 lb (89.812 kg)  well-developed well-nourished male in no acute distress. HEENT exam atraumatic, normocephalic, neck supple without jugular venous distention. Chest clear to auscultation  cardiac exam S1-S2 are regular. Abdominal exam overweight with bowel sounds, soft and nontender. Extremities no edema. Neurologic exam is alert with a normal gait. No pain to palpation  Chest pain- i do not think this is cardiac but he needs further evaluation Suspect-- GI-- trial PPI

## 2012-03-19 ENCOUNTER — Ambulatory Visit (INDEPENDENT_AMBULATORY_CARE_PROVIDER_SITE_OTHER): Payer: Medicare Other | Admitting: Physician Assistant

## 2012-03-19 ENCOUNTER — Encounter: Payer: Self-pay | Admitting: Physician Assistant

## 2012-03-19 DIAGNOSIS — R079 Chest pain, unspecified: Secondary | ICD-10-CM | POA: Diagnosis not present

## 2012-03-19 NOTE — Procedures (Signed)
Richard Mahoney is a 66 y.o. male referred by PCP for chest pain.  No PMH of CAD, HTN, DM2, HL.  Never been a smoker.  Both parents with hx of CAD.  CP recently improved with addition of PPI.  No hx of exertional CP, DOE, syncope.  Exam unremarkable.   Exercise Treadmill Test  Pre-Exercise Testing Evaluation Rhythm: normal sinus  Rate: 63   PR:  .16 QRS:  .08  QT:  .42 QTc: .43     Test  Exercise Tolerance Test Ordering MD: Birdie Sons , MD  Interpreting MD: Tereso Newcomer , PA-C  Unique Test No: 1  Treadmill:  1  Indication for ETT: chest pain - rule out ischemia  Contraindication to ETT: No   Stress Modality: exercise - treadmill  Cardiac Imaging Performed: non   Protocol: standard Bruce - maximal  Max BP:  198/63  Max MPHR (bpm):  152 85% MPR (bpm):  129  MPHR obtained (bpm):  153 % MPHR obtained:  100%  Reached 85% MPHR (min:sec):  6:00 Total Exercise Time (min-sec):  9:00  Workload in METS:  10.1 Borg Scale: 15  Reason ETT Terminated:  desired heart rate attained    ST Segment Analysis At Rest: normal ST segments - no evidence of significant ST depression With Exercise: borderline ST changes  Other Information Arrhythmia:  No Angina during ETT:  absent (0) Quality of ETT:  indeterminate  ETT Interpretation:  borderline (indeterminate) with non-specific ST changes  Comments: Good exercise tolerance. No chest pain. Normal BP response to exercise. There was 1 mm of flat ST depression in V2-3 at peak exercise.  Ischemia cannot be ruled out.   Recommendations: Suggest ETT-Myoview to rule out ischemia. I offered to schedule Myoview today.  Patient prefers to follow up with [name], MD before proceeding. I have advised him to arrange follow up to discuss results. Tereso Newcomer, PA-C  3:34 PM 03/19/2012

## 2012-03-21 ENCOUNTER — Other Ambulatory Visit: Payer: Self-pay | Admitting: Internal Medicine

## 2012-03-21 DIAGNOSIS — R079 Chest pain, unspecified: Secondary | ICD-10-CM

## 2012-04-02 ENCOUNTER — Ambulatory Visit (HOSPITAL_COMMUNITY): Payer: Medicare Other | Attending: Internal Medicine | Admitting: Radiology

## 2012-04-02 VITALS — BP 135/83 | HR 63 | Ht 72.0 in | Wt 198.0 lb

## 2012-04-02 DIAGNOSIS — R5381 Other malaise: Secondary | ICD-10-CM | POA: Insufficient documentation

## 2012-04-02 DIAGNOSIS — Z8249 Family history of ischemic heart disease and other diseases of the circulatory system: Secondary | ICD-10-CM | POA: Insufficient documentation

## 2012-04-02 DIAGNOSIS — I999 Unspecified disorder of circulatory system: Secondary | ICD-10-CM | POA: Insufficient documentation

## 2012-04-02 DIAGNOSIS — R079 Chest pain, unspecified: Secondary | ICD-10-CM | POA: Diagnosis not present

## 2012-04-02 DIAGNOSIS — R9439 Abnormal result of other cardiovascular function study: Secondary | ICD-10-CM | POA: Diagnosis not present

## 2012-04-02 MED ORDER — TECHNETIUM TC 99M SESTAMIBI GENERIC - CARDIOLITE
11.0000 | Freq: Once | INTRAVENOUS | Status: AC | PRN
  Administered 2012-04-02: 11 via INTRAVENOUS

## 2012-04-02 MED ORDER — TECHNETIUM TC 99M SESTAMIBI GENERIC - CARDIOLITE
33.0000 | Freq: Once | INTRAVENOUS | Status: AC | PRN
  Administered 2012-04-02: 33 via INTRAVENOUS

## 2012-04-02 NOTE — Progress Notes (Signed)
[facility] SITE 3 NUCLEAR MED [address] [place] [address] [phone]  Cardiology Nuclear Med Study  Richard Mahoney is a 66 y.o. male     MRN : [id number]     DOB: 1946-05-15  Procedure Date: 04/02/2012  Nuclear Med Background Indication for Stress Test:  Evaluation for Ischemia and 03/19/12 Abnormal GXT History:  03/19/12 [id number], nonspecific ST-T wave changes. Cardiac Risk Factors: Family History - CAD  Symptoms:  Chest Pain/Pressure.  (last episode of chest discomfort was about one week ago) and Fatigue   Nuclear Pre-Procedure Caffeine/Decaff Intake:  None> 12 hrs NPO After: 7:30pm   Lungs:  Clear. O2 Sat: 95% on room air. IV 0.9% NS with Angio Cath:  22g  IV Site: R Antecubital x 1 , tolerated well IV Started by:  Irean Hong, RN  Chest Size (in):  42 Cup Size: n/a  Height: 6' (1.829 m)  Weight:  198 lb (89.812 kg)  BMI:  Body mass index is 26.85 kg/(m^2). Tech Comments:  n/a    Nuclear Med Study 1 or 2 day study: 1 day  Stress Test Type:  Stress  Reading MD: Cassell Clement, MD  Order Authorizing Provider:  Birdie Sons, MD, and Tereso Newcomer, PA-C  Resting Radionuclide: Technetium 52m Sestamibi  Resting Radionuclide Dose: 11.0 mCi   Stress Radionuclide:  Technetium 87m Sestamibi  Stress Radionuclide Dose: 33.0 mCi           Stress Protocol Rest HR: 63 Stress HR: 162  Rest BP: 135/83 Stress BP: 202/92  Exercise Time (min): 10:03 METS: 11.8   Predicted Max HR: 152 bpm % Max HR: 106.58 bpm Rate Pressure Product: 29528   Dose of Adenosine (mg):  n/a Dose of Lexiscan: 0.4 mg  Dose of Atropine (mg): n/a Dose of Dobutamine: n/a    Stress Test Technologist: Smiley Houseman, CMA-N  Nuclear Technologist:  Domenic Polite, CNMT     Rest Procedure:  Myocardial perfusion imaging was performed at rest 45 minutes following the intravenous administration of Technetium 55m Sestamibi.  Rest ECG: No acute changes  Stress Procedure:  The  patient exercised on the teadmill utilizing the Bruce protocol for 10:03 minutes. He then stopped due to fatigue and denied any chest pain.  There were ST-T wave changes with exercise, that quickly resolved with recovery.  Technetium 9m Sestamibi was injected at peak exercise and myocardial perfusion imaging was performed after a brief delay.  Stress ECG: Significant ST abnormalities consistent with ischemia.  QPS Raw Data Images:  Normal; no motion artifact; normal heart/lung ratio. Stress Images:  There is decreased uptake in the inferior wall. Rest Images:  There is decreased uptake in the inferior wall. Subtraction (SDS):  Suggestive of mild reversible ischemia of small size in basal inferior and basal inferoseptal areas. Diaphragmatic attenuation cannot be excluded. Transient Ischemic Dilatation (Normal <1.22):  0.95 Lung/Heart Ratio (Normal <0.45):  0.20  Quantitative Gated Spect Images QGS EDV:  85 ml QGS ESV:  32 ml  Impression Exercise Capacity:  Good exercise capacity. BP Response:  Hypertensive blood pressure response. Clinical Symptoms:  No chest pain. ECG Impression:  Significant ST abnormalities consistent with ischemia. Comparison with Prior Nuclear Study: No previous nuclear study performed  Overall Impression:  Abnormal stress nuclear study.  LV Ejection Fraction: 62%.  LV Wall Motion:  NL LV Function; NL Wall Motion  Discussion: Patient had no chest pain with exercise. He does develop 2mm ST depression V2-V5 with exercise associated with small  area of reversible ischemia of moderate severity in the basal inferior and basal inferoseptal distribution.  Diaphragmatic attenuation is possible and there are no segmental wall motion abnormalities. Would consider diagnostic cath.  Cassell Clement

## 2012-04-04 ENCOUNTER — Encounter: Payer: Self-pay | Admitting: *Deleted

## 2012-04-04 ENCOUNTER — Ambulatory Visit (INDEPENDENT_AMBULATORY_CARE_PROVIDER_SITE_OTHER): Payer: Medicare Other | Admitting: Cardiovascular Disease

## 2012-04-04 ENCOUNTER — Telehealth: Payer: Self-pay | Admitting: Physician Assistant

## 2012-04-04 ENCOUNTER — Telehealth: Payer: Self-pay | Admitting: *Deleted

## 2012-04-04 ENCOUNTER — Encounter: Payer: Self-pay | Admitting: Cardiovascular Disease

## 2012-04-04 VITALS — BP 153/87 | HR 74 | Ht 72.0 in | Wt 199.0 lb

## 2012-04-04 DIAGNOSIS — R9430 Abnormal result of cardiovascular function study, unspecified: Secondary | ICD-10-CM | POA: Diagnosis not present

## 2012-04-04 DIAGNOSIS — R079 Chest pain, unspecified: Secondary | ICD-10-CM

## 2012-04-04 DIAGNOSIS — I200 Unstable angina: Secondary | ICD-10-CM | POA: Diagnosis not present

## 2012-04-04 NOTE — Telephone Encounter (Signed)
New Problem:    Patient called returning your call about scheduling an appointment for today.  Please call back.

## 2012-04-04 NOTE — Telephone Encounter (Signed)
Spoke with pt and he will come in to see Dr. [name] today at 4:15

## 2012-04-04 NOTE — Telephone Encounter (Signed)
Message copied by Tarri Fuller on Wed Apr 04, 2012 10:08 AM ------      Message from: [place], [place] T      Created: Wed Apr 04, 2012  8:25 AM       Abnormal stress test.      He needs to be seen for evaluation and consider diagnostic cardiac cath.      He was referred by PCP for ETT that was abnormal and prompted nuclear study.      He will need to see one of the doctors as a new patient.      Would arrange in the next week.      Tereso Newcomer, PA-C  8:24 AM 04/04/2012

## 2012-04-04 NOTE — Telephone Encounter (Signed)
pt coming in today @ 4:15 to see Dr. [name] to go over [facility] results and to discuss possible cath per Dr. Yevonne Pax results notes from [facility]

## 2012-04-04 NOTE — Patient Instructions (Addendum)
Your physician recommends that you schedule a follow-up appointment in:  4 weeks.   Your physician has requested that you have a cardiac catheterization. Cardiac catheterization is used to diagnose and/or treat various heart conditions. Doctors may recommend this procedure for a number of different reasons. The most common reason is to evaluate chest pain. Chest pain can be a symptom of coronary artery disease (CAD), and cardiac catheterization can show whether plaque is narrowing or blocking your heart's arteries. This procedure is also used to evaluate the valves, as well as measure the blood flow and oxygen levels in different parts of your heart. For further information please visit [email or web address] Please follow instruction sheet, as given. Planned for Sept 27,2013. We will call you tomorrow with time.    Coronary Angiography Coronary angiography is an X-ray procedure used to look at the arteries in the heart. In this procedure, a dye is injected through a long, hollow tube (catheter). The catheter is about the size of a piece of cooked spaghetti. The catheter injects a dye into an artery in your groin. X-rays are then taken to show if there is a blockage in the arteries of your heart. BEFORE THE PROCEDURE   Let your caregiver know if you have allergies to shellfish or contrast dye. Also let your caregiver know if you have kidney problems or failure.   Do not eat or drink starting from midnight up to the time of the procedure, or as directed.   You may drink enough water to take your medications the morning of the procedure if you were instructed to do so.   You should be at the hospital or outpatient facility where the procedure is to be done 60 minutes prior to the procedure or as directed.  PROCEDURE  You may be given an IV medication to help you relax before the procedure.   You will be prepared for the procedure by washing and shaving the area where the catheter will be inserted.  This is usually done in the groin but may be done in the fold of your arm by your elbow.   A medicine will be given to numb your groin where the catheter will be inserted.   A specially trained doctor will insert the catheter into an artery in your groin. The catheter is guided by using a special type of X-ray (fluoroscopy) to the blood vessel being examined.   A special dye is then injected into the catheter and X-rays are taken. The dye helps to show where any narrowing or blockages are located in the heart arteries.  AFTER THE PROCEDURE   After the procedure you will be kept in bed lying flat for several hours. You will be instructed to not bend or cross your legs.   The groin insertion site will be watched and checked frequently.   The pulse in your feet will be checked frequently.   Additional blood tests, X-rays and an EKG may be done.   You may stay in the hospital overnight for observation.  SEEK IMMEDIATE MEDICAL CARE IF:   You develop chest pain, shortness of breath, feel faint, or pass out.   There is bleeding, swelling, or drainage from the catheter insertion site.   You develop pain, discoloration, coldness, or severe bruising in the leg or area where the catheter was inserted.   You have a fever.  Document Released: 01/01/2003 Document Revised: 06/16/2011 Document Reviewed: 02/20/2008 [facility] Patient Information 2012 [place], [place].

## 2012-04-04 NOTE — Progress Notes (Signed)
 History of Present Illness: 66 yo male with history of kidney stones, possible GERD with strong family history of CAD with recent episodes of chest pain who is referred today for evaluation of chest pain. He was seen by Dr. Swords in August 2013 and had c/o left sided chest pain with radiation into the left arm. This did not improve with a trial of PPI. Exercise stress test arranged by Dr. Swords on 03/19/12 with no chest pain but ST depression concerning for ischemia. Stress myoview was ordered on 04/02/12. He had good exercise tolerance, no chest pain but ST depression at peak exercise and inferior wall defect concerning for ischemia. LV function normal.   He is here today to discuss the results of his test. He tells me that he has been having pain in his left chest for several months. There has been radiation into his left arm which feels heavy. He has had some improvement in his chest pain and arm pain since starting the PPI. He stills has some pinching left sided chest pain with exertion.   Primary Care Physician: Bruce Swords  Last Lipid Profile:Lipid Panel     Component Value Date/Time   CHOL 200 05/30/2011 0946   TRIG 153.0* 05/30/2011 0946   HDL 46.10 05/30/2011 0946   CHOLHDL 4 05/30/2011 0946   VLDL 30.6 05/30/2011 0946   LDLCALC 123* 05/30/2011 0946     Past Medical History  Diagnosis Date  . Nephrolithiasis     Past Surgical History  Procedure Date  . Hernia repair     inguinal   . Knee surgery     right knee-arthroscopic procedure    Current Outpatient Prescriptions  Medication Sig Dispense Refill  . aspirin 81 MG tablet Take 81 mg by mouth daily.      . Melatonin 3 MG TABS Take by mouth. 1 tab qhs      . omeprazole (PRILOSEC) 20 MG capsule Take 1 capsule (20 mg total) by mouth daily.  90 capsule  3    No Known Allergies  History   Social History  . Marital Status: Married    Spouse Name: N/A    Number of Children: 2  . Years of Education: N/A    Occupational History  . Retired    Social History Main Topics  . Smoking status: Never Smoker   . Smokeless tobacco: Never Used  . Alcohol Use: No  . Drug Use: No  . Sexually Active: Not on file   Other Topics Concern  . Not on file   Social History Narrative  . No narrative on file    Family History  Problem Relation Age of Onset  . Diabetes Mother   . Heart attack Mother 70  . Diabetes Father   . Heart attack Father 56  . Diabetes Sister   . Arthritis Brother     Review of Systems:  As stated in the HPI and otherwise negative.   BP 153/87  Pulse 74  Ht 6' (1.829 m)  Wt 199 lb (90.266 kg)  BMI 26.99 kg/m2  Physical Examination: General: Well developed, well nourished, NAD HEENT: OP clear, mucus membranes moist SKIN: warm, dry. No rashes. Neuro: No focal deficits Musculoskeletal: Muscle strength 5/5 all ext Psychiatric: Mood and affect normal Neck: No JVD, no carotid bruits, no thyromegaly, no lymphadenopathy. Lungs:Clear bilaterally, no wheezes, rhonci, crackles Cardiovascular: Regular rate and rhythm. No murmurs, gallops or rubs. Abdomen:Soft. Bowel sounds present. Non-tender.  Extremities: No lower extremity edema.   Pulses are 2 + in the bilateral DP/PT.  EKG:  Exercise stress myoview: 04/02/12:  Stress Procedure: The patient exercised on the teadmill utilizing the Bruce protocol for 10:03 minutes. He then stopped due to fatigue and denied any chest pain. There were ST-T wave changes with exercise, that quickly resolved with recovery. Technetium 99m Sestamibi was injected at peak exercise and myocardial perfusion imaging was performed after a brief delay.  Stress ECG: Significant ST abnormalities consistent with ischemia.  QPS  Raw Data Images: Normal; no motion artifact; normal heart/lung ratio.  Stress Images: There is decreased uptake in the inferior wall.  Rest Images: There is decreased uptake in the inferior wall.  Subtraction (SDS): Suggestive of  mild reversible ischemia of small size in basal inferior and basal inferoseptal areas. Diaphragmatic attenuation cannot be excluded.  Transient Ischemic Dilatation (Normal <1.22): 0.95  Lung/Heart Ratio (Normal <0.45): 0.20  Quantitative Gated Spect Images  QGS EDV: 85 ml  QGS ESV: 32 ml  Impression  Exercise Capacity: Good exercise capacity.  BP Response: Hypertensive blood pressure response.  Clinical Symptoms: No chest pain.  ECG Impression: Significant ST abnormalities consistent with ischemia.  Comparison with Prior Nuclear Study: No previous nuclear study performed  Overall Impression: Abnormal stress nuclear study.  LV Ejection Fraction: 62%. LV Wall Motion: NL LV Function; NL Wall Motion  Discussion: Patient had no chest pain with exercise. He does develop 2mm ST depression V2-V5 with exercise associated with small area of reversible ischemia of moderate severity in the basal inferior and basal inferoseptal distribution. Diaphragmatic attenuation is possible and there are no segmental wall motion abnormalities.  Would consider diagnostic cath.   Assessment and Plan:   1. Chest pain concerning for unstable angina/abnormal myoview: Patient with strong family history of CAD and abnormal myoview suggesting inferior wall ischemia with exercise induced ST depression at peak exercise. This is concerning for obstructive CAD. I have discussed a cardiac cath to exclude CAD. I have reviewed the risks and benefits and he agrees to proceed. Will arrange in outpt cath lab on Friday 04/06/12. Will call with time in am. Pre-cath labs today. He prefers the groin approach.  

## 2012-04-05 ENCOUNTER — Telehealth: Payer: Self-pay | Admitting: *Deleted

## 2012-04-05 LAB — CBC WITH DIFFERENTIAL/PLATELET
Basophils Relative: 0.8 % (ref 0.0–3.0)
Eosinophils Absolute: 0.3 10*3/uL (ref 0.0–0.7)
Hemoglobin: 13.4 g/dL (ref 13.0–17.0)
MCHC: 33.5 g/dL (ref 30.0–36.0)
MCV: 92.6 fl (ref 78.0–100.0)
Monocytes Absolute: 0.4 10*3/uL (ref 0.1–1.0)
Neutro Abs: 2.7 10*3/uL (ref 1.4–7.7)
RBC: 4.34 Mil/uL (ref 4.22–5.81)

## 2012-04-05 LAB — BASIC METABOLIC PANEL
CO2: 29 mEq/L (ref 19–32)
Chloride: 102 mEq/L (ref 96–112)
Creatinine, Ser: 1.1 mg/dL (ref 0.4–1.5)
Glucose, Bld: 121 mg/dL — ABNORMAL HIGH (ref 70–99)
Sodium: 138 mEq/L (ref 135–145)

## 2012-04-05 NOTE — Telephone Encounter (Signed)
Spoke with pt and told him cath is scheduled for 10:30 tomorrow in [facility] lab.  He is aware to arrive at 9:30

## 2012-04-06 ENCOUNTER — Encounter (HOSPITAL_COMMUNITY): Payer: Self-pay | Admitting: Pharmacy Technician

## 2012-04-06 ENCOUNTER — Inpatient Hospital Stay (HOSPITAL_BASED_OUTPATIENT_CLINIC_OR_DEPARTMENT_OTHER)
Admission: RE | Admit: 2012-04-06 | Discharge: 2012-04-06 | Disposition: A | Discharge status: Home / Self Care | Payer: PRIVATE HEALTH INSURANCE | Source: Ambulatory Visit | Attending: Cardiovascular Disease | Admitting: Cardiovascular Disease

## 2012-04-06 ENCOUNTER — Encounter (HOSPITAL_BASED_OUTPATIENT_CLINIC_OR_DEPARTMENT_OTHER)
Admission: RE | Disposition: A | Discharge status: Home / Self Care | Payer: Self-pay | Source: Ambulatory Visit | Attending: Cardiovascular Disease

## 2012-04-06 DIAGNOSIS — I2510 Atherosclerotic heart disease of native coronary artery without angina pectoris: Secondary | ICD-10-CM | POA: Diagnosis not present

## 2012-04-06 DIAGNOSIS — I200 Unstable angina: Secondary | ICD-10-CM

## 2012-04-06 SURGERY — JV LEFT HEART CATHETERIZATION WITH CORONARY ANGIOGRAM
Anesthesia: Moderate Sedation

## 2012-04-06 MED ORDER — SODIUM CHLORIDE 0.9 % IV SOLN
INTRAVENOUS | Status: DC
  Administered 2012-04-06: 10:00:00 via INTRAVENOUS

## 2012-04-06 MED ORDER — NITROGLYCERIN 0.4 MG/SPRAY TL SOLN: 1.0000 | Status: DC | PRN

## 2012-04-06 MED ORDER — SODIUM CHLORIDE 0.9 % IJ SOLN: 3.0000 mL | INTRAMUSCULAR | Status: DC | PRN

## 2012-04-06 MED ORDER — SODIUM CHLORIDE 0.9 % IV SOLN: 250.0000 mL | INTRAVENOUS | Status: DC | PRN

## 2012-04-06 MED ORDER — SODIUM CHLORIDE 0.9 % IV SOLN
INTRAVENOUS | Status: AC
Start: 2012-04-06 — End: 2012-04-06

## 2012-04-06 MED ORDER — ACETAMINOPHEN 325 MG PO TABS
650.0000 mg | ORAL_TABLET | ORAL | Status: DC | PRN
  Administered 2012-04-06: 650 mg via ORAL

## 2012-04-06 MED ORDER — DIAZEPAM 5 MG PO TABS
5.0000 mg | ORAL_TABLET | ORAL | Status: AC
  Administered 2012-04-06: 5 mg via ORAL

## 2012-04-06 MED ORDER — CLOPIDOGREL BISULFATE 300 MG PO TABS
600.0000 mg | ORAL_TABLET | Freq: Once | ORAL | Status: AC
  Administered 2012-04-06: 600 mg via ORAL

## 2012-04-06 MED ORDER — SODIUM CHLORIDE 0.9 % IJ SOLN: 3.0000 mL | Freq: Two times a day (BID) | INTRAMUSCULAR | Status: DC

## 2012-04-06 MED ORDER — ASPIRIN 81 MG PO CHEW
324.0000 mg | CHEWABLE_TABLET | ORAL | Status: AC
  Administered 2012-04-06: 324 mg via ORAL

## 2012-04-06 NOTE — Interval H&P Note (Signed)
History and Physical Interval Note:  04/06/2012 12:36 PM  Richard Mahoney  has presented today for cardiac cath with the diagnosis of cp  The various methods of treatment have been discussed with the patient and family. After consideration of risks, benefits and other options for treatment, the patient has consented to  Procedure(s) (LRB) with comments: JV LEFT HEART CATHETERIZATION WITH CORONARY ANGIOGRAM (N/A) as a surgical intervention .  The patient's history has been reviewed, patient examined, no change in status, stable for surgery.  I have reviewed the patient's chart and labs.  Questions were answered to the patient's satisfaction.     MCALHANY,CHRISTOPHER

## 2012-04-06 NOTE — Progress Notes (Signed)
Allen's test performed on right hand with normal results, spo2 94%.

## 2012-04-06 NOTE — H&P (View-Only) (Signed)
 History of Present Illness: 66 yo male with history of kidney stones, possible GERD with strong family history of CAD with recent episodes of chest pain who is referred today for evaluation of chest pain. He was seen by Dr. Swords in August 2013 and had c/o left sided chest pain with radiation into the left arm. This did not improve with a trial of PPI. Exercise stress test arranged by Dr. Swords on 03/19/12 with no chest pain but ST depression concerning for ischemia. Stress myoview was ordered on 04/02/12. He had good exercise tolerance, no chest pain but ST depression at peak exercise and inferior wall defect concerning for ischemia. LV function normal.   He is here today to discuss the results of his test. He tells me that he has been having pain in his left chest for several months. There has been radiation into his left arm which feels heavy. He has had some improvement in his chest pain and arm pain since starting the PPI. He stills has some pinching left sided chest pain with exertion.   Primary Care Physician: Bruce Swords  Last Lipid Profile:Lipid Panel     Component Value Date/Time   CHOL 200 05/30/2011 0946   TRIG 153.0* 05/30/2011 0946   HDL 46.10 05/30/2011 0946   CHOLHDL 4 05/30/2011 0946   VLDL 30.6 05/30/2011 0946   LDLCALC 123* 05/30/2011 0946     Past Medical History  Diagnosis Date  . Nephrolithiasis     Past Surgical History  Procedure Date  . Hernia repair     inguinal   . Knee surgery     right knee-arthroscopic procedure    Current Outpatient Prescriptions  Medication Sig Dispense Refill  . aspirin 81 MG tablet Take 81 mg by mouth daily.      . Melatonin 3 MG TABS Take by mouth. 1 tab qhs      . omeprazole (PRILOSEC) 20 MG capsule Take 1 capsule (20 mg total) by mouth daily.  90 capsule  3    No Known Allergies  History   Social History  . Marital Status: Married    Spouse Name: N/A    Number of Children: 2  . Years of Education: N/A    Occupational History  . Retired    Social History Main Topics  . Smoking status: Never Smoker   . Smokeless tobacco: Never Used  . Alcohol Use: No  . Drug Use: No  . Sexually Active: Not on file   Other Topics Concern  . Not on file   Social History Narrative  . No narrative on file    Family History  Problem Relation Age of Onset  . Diabetes Mother   . Heart attack Mother 70  . Diabetes Father   . Heart attack Father 56  . Diabetes Sister   . Arthritis Brother     Review of Systems:  As stated in the HPI and otherwise negative.   BP 153/87  Pulse 74  Ht 6' (1.829 m)  Wt 199 lb (90.266 kg)  BMI 26.99 kg/m2  Physical Examination: General: Well developed, well nourished, NAD HEENT: OP clear, mucus membranes moist SKIN: warm, dry. No rashes. Neuro: No focal deficits Musculoskeletal: Muscle strength 5/5 all ext Psychiatric: Mood and affect normal Neck: No JVD, no carotid bruits, no thyromegaly, no lymphadenopathy. Lungs:Clear bilaterally, no wheezes, rhonci, crackles Cardiovascular: Regular rate and rhythm. No murmurs, gallops or rubs. Abdomen:Soft. Bowel sounds present. Non-tender.  Extremities: No lower extremity edema.   Pulses are 2 + in the bilateral DP/PT.  EKG:  Exercise stress myoview: 04/02/12:  Stress Procedure: The patient exercised on the teadmill utilizing the Bruce protocol for 10:03 minutes. He then stopped due to fatigue and denied any chest pain. There were ST-T wave changes with exercise, that quickly resolved with recovery. Technetium 99m Sestamibi was injected at peak exercise and myocardial perfusion imaging was performed after a brief delay.  Stress ECG: Significant ST abnormalities consistent with ischemia.  QPS  Raw Data Images: Normal; no motion artifact; normal heart/lung ratio.  Stress Images: There is decreased uptake in the inferior wall.  Rest Images: There is decreased uptake in the inferior wall.  Subtraction (SDS): Suggestive of  mild reversible ischemia of small size in basal inferior and basal inferoseptal areas. Diaphragmatic attenuation cannot be excluded.  Transient Ischemic Dilatation (Normal <1.22): 0.95  Lung/Heart Ratio (Normal <0.45): 0.20  Quantitative Gated Spect Images  QGS EDV: 85 ml  QGS ESV: 32 ml  Impression  Exercise Capacity: Good exercise capacity.  BP Response: Hypertensive blood pressure response.  Clinical Symptoms: No chest pain.  ECG Impression: Significant ST abnormalities consistent with ischemia.  Comparison with Prior Nuclear Study: No previous nuclear study performed  Overall Impression: Abnormal stress nuclear study.  LV Ejection Fraction: 62%. LV Wall Motion: NL LV Function; NL Wall Motion  Discussion: Patient had no chest pain with exercise. He does develop 2mm ST depression V2-V5 with exercise associated with small area of reversible ischemia of moderate severity in the basal inferior and basal inferoseptal distribution. Diaphragmatic attenuation is possible and there are no segmental wall motion abnormalities.  Would consider diagnostic cath.   Assessment and Plan:   1. Chest pain concerning for unstable angina/abnormal myoview: Patient with strong family history of CAD and abnormal myoview suggesting inferior wall ischemia with exercise induced ST depression at peak exercise. This is concerning for obstructive CAD. I have discussed a cardiac cath to exclude CAD. I have reviewed the risks and benefits and he agrees to proceed. Will arrange in outpt cath lab on Friday 04/06/12. Will call with time in am. Pre-cath labs today. He prefers the groin approach.  

## 2012-04-06 NOTE — CV Procedure (Signed)
Cardiac Catheterization Operative Report  Richard Mahoney [id number] 9/27/20131:03 PM [name], MD  Procedure Performed:  1. Left Heart Catheterization 2. Selective Coronary Angiography 3. Left ventricular angiogram  Operator: Verne Carrow, MD  Arterial access site:  Right radial artery.   Indication: 66 yo male with history of kidney stones, possible GERD with strong family history of CAD with recent episodes of chest pain who is referred today for evaluation of chest pain. He was seen by Dr. Cato Mulligan in August 2013 and had c/o left sided chest pain with radiation into the left arm. This did not improve with a trial of PPI. Exercise stress test arranged by Dr. Cato Mulligan on 03/19/12 with no chest pain but ST depression concerning for ischemia. Stress myoview was ordered on 04/02/12. He had good exercise tolerance, no chest pain but ST depression at peak exercise and inferior wall defect concerning for ischemia. LV function normal. Cardiac cath to exclude obstructive CAD.                                    Procedure Details: The risks, benefits, complications, treatment options, and expected outcomes were discussed with the patient. The patient and/or family concurred with the proposed plan, giving informed consent. The patient was brought to the cath lab after IV hydration was begun and oral premedication was given. The patient was further sedated with Versed and Fentanyl. The right wrist was assessed with an Allens test which was positive. The right wrist was prepped and draped in a sterile fashion. 1% lidocaine was used for local anesthesia. Using the modified Seldinger access technique, a 5 French sheath was placed in the right radial artery. 3 mg Verapamil was given through the sheath. 4500 units IV heparin was given. Standard diagnostic catheters were used to perform selective coronary angiography. A pigtail catheter was used to perform a left ventricular angiogram. The  sheath was removed from the right radial artery and a Terumo hemostasis band was applied at the arteriotomy site on the right wrist.    There were no immediate complications. The patient was taken to the recovery area in stable condition.   Hemodynamic Findings: Central aortic pressure: 122/65 Left ventricular pressure: 122/13/20  Angiographic Findings:  Left main: No obstructive disease noted.   Left Anterior Descending Artery: Moderate caliber vessel that courses to the apex. The proximal vessel has a 30% stenosis. The mid vessel has a complex stenosis at the trifurcation of a moderate sized diagonal branch and a small caliber septal perforating branch. The LAD has 70-80% stenosis in the mid segment beginning just before the trifurcation and extending just beyond the trifurcation. Several views suggest that this stenosis may be flow limiting. The distal LAD has no obstructive disease. The diagonal branch has a 40-50% ostial stenosis.   Circumflex Artery: Large caliber, co-dominant vessel with no obstructive disease noted. There are three small caliber obtuse marginal branches that have no obstructive disease noted.   Right Coronary Artery: Small caliber, co-dominant vessel with mild plaque in the proximal and mid segments.   Left Ventricular Angiogram: LVEF=65%  Impression: 1. Single vessel CAD with complex stenosis involving the mid LAD at the takeoff of a moderate sized diagonal branch and small caliber perforating branch.  2. Preserved LV systolic function 3. Chest pain concerning for unstable angina  Recommendations: The disease in his LAD is at least moderate and appears in several views that  it may be flow limiting. Will bring back next week for FFR of LAD. If it is flow limiting, will perform PCI with stent placement in the mid LAD. May need to pre-treat the diagonal stenosis. Will load with Plavix 600 mg po x 1 today and start Plavix 75 mg po Qdaily. Will start Imdur 30 mg po QHS,  ASA 81 mg po Qdaily, atorvastatin 40 mg po QHS.        Complications:  None. The patient tolerated the procedure well.

## 2012-04-06 NOTE — OR Nursing (Signed)
Dr McAlhany at bedside to discuss results and treatment plan with pt and family 

## 2012-04-06 NOTE — Interval H&P Note (Signed)
History and Physical Interval Note:  04/06/2012 10:29 AM  Richard Mahoney  has presented today for cardiac cath with the diagnosis of cp  The various methods of treatment have been discussed with the patient and family. After consideration of risks, benefits and other options for treatment, the patient has consented to  Procedure(s) (LRB) with comments: JV LEFT HEART CATHETERIZATION WITH CORONARY ANGIOGRAM (N/A) as a surgical intervention .  The patient's history has been reviewed, patient examined, no change in status, stable for surgery.  I have reviewed the patient's chart and labs.  Questions were answered to the patient's satisfaction.     MCALHANY,CHRISTOPHER

## 2012-04-09 ENCOUNTER — Telehealth: Payer: Self-pay | Admitting: Cardiovascular Disease

## 2012-04-09 NOTE — Telephone Encounter (Signed)
New Problem:    Patient called in because he is scheduled to have a procedure this Wednesday and would like to know how he should proceed.  Please call back.

## 2012-04-09 NOTE — Telephone Encounter (Deleted)
Pt will go back to [facility] hospital October 4 th not October 2 Nd, for the [facility] of LAD.

## 2012-04-09 NOTE — Telephone Encounter (Signed)
Patient called because he had a cardiac cath on September 27 th and needs to go back on October 2 Nd for another procedure " FFR of LAD".  Pt states Dr. [name] said that he was going to talk with his colleges to   determine the best approach to what pt got. Pt would like to know, If MD has determine the plan to take for the  Procedure needed.

## 2012-04-10 ENCOUNTER — Telehealth: Payer: Self-pay | Admitting: *Deleted

## 2012-04-10 NOTE — Telephone Encounter (Signed)
Pt calling wanting to know if dr [name] had discussed his problems with LAD stenting  with his partners---mr Haber is sched for a procedure tomorrow in cath lab and dr [name] told pt he was going to discuss with partners to see what procedure would be best--i phoned cath lab to see if procedure was still sched and what could they tell me--they advised procedure still sched and pt should still plan on coming in--as far as what procedure was going to be done--they were not notified of anything special  --i called pt and explained all to him and advised to come in and ask his questions when he sees dr [name] tomorrow--pt agrees--if in meantime dr [name] phones in i will call pt back--he agrees

## 2012-04-11 ENCOUNTER — Encounter (HOSPITAL_COMMUNITY)
Admission: RE | Disposition: A | Discharge status: Home / Self Care | Payer: Self-pay | Source: Ambulatory Visit | Attending: Cardiovascular Disease

## 2012-04-11 ENCOUNTER — Ambulatory Visit (HOSPITAL_COMMUNITY)
Admission: RE | Admit: 2012-04-11 | Discharge: 2012-04-12 | Disposition: A | Discharge status: Home / Self Care | Payer: PRIVATE HEALTH INSURANCE | Source: Ambulatory Visit | Attending: Cardiovascular Disease | Admitting: Cardiovascular Disease

## 2012-04-11 ENCOUNTER — Encounter (HOSPITAL_COMMUNITY): Payer: Self-pay | Admitting: General Practice

## 2012-04-11 DIAGNOSIS — K219 Gastro-esophageal reflux disease without esophagitis: Secondary | ICD-10-CM | POA: Insufficient documentation

## 2012-04-11 DIAGNOSIS — I209 Angina pectoris, unspecified: Secondary | ICD-10-CM | POA: Insufficient documentation

## 2012-04-11 DIAGNOSIS — I2510 Atherosclerotic heart disease of native coronary artery without angina pectoris: Secondary | ICD-10-CM | POA: Insufficient documentation

## 2012-04-11 DIAGNOSIS — I200 Unstable angina: Secondary | ICD-10-CM

## 2012-04-11 DIAGNOSIS — E785 Hyperlipidemia, unspecified: Secondary | ICD-10-CM

## 2012-04-11 DIAGNOSIS — Z955 Presence of coronary angioplasty implant and graft: Secondary | ICD-10-CM

## 2012-04-11 HISTORY — PX: CORONARY ANGIOPLASTY WITH STENT PLACEMENT: SHX49

## 2012-04-11 HISTORY — PX: PERCUTANEOUS CORONARY STENT INTERVENTION (PCI-S): SHX5485

## 2012-04-11 HISTORY — DX: Atherosclerotic heart disease of native coronary artery without angina pectoris: I25.10

## 2012-04-11 HISTORY — DX: Hyperlipidemia, unspecified: E78.5

## 2012-04-11 HISTORY — DX: Gastro-esophageal reflux disease without esophagitis: K21.9

## 2012-04-11 LAB — CBC
MCV: 89 fL (ref 78.0–100.0)
Platelets: 220 10*3/uL (ref 150–400)
RBC: 4.36 MIL/uL (ref 4.22–5.81)
WBC: 5.4 10*3/uL (ref 4.0–10.5)

## 2012-04-11 LAB — BASIC METABOLIC PANEL
CO2: 26 mEq/L (ref 19–32)
Calcium: 9.4 mg/dL (ref 8.4–10.5)
GFR calc non Af Amer: 65 mL/min — ABNORMAL LOW (ref >90)
Sodium: 139 mEq/L (ref 135–145)

## 2012-04-11 SURGERY — PERCUTANEOUS CORONARY STENT INTERVENTION (PCI-S)
Anesthesia: LOCAL

## 2012-04-11 MED ORDER — VERAPAMIL HCL 2.5 MG/ML IV SOLN
INTRAVENOUS | Status: AC
  Filled 2012-04-11: qty 2

## 2012-04-11 MED ORDER — FENTANYL CITRATE 0.05 MG/ML IJ SOLN
INTRAMUSCULAR | Status: AC
  Filled 2012-04-11: qty 2

## 2012-04-11 MED ORDER — SODIUM CHLORIDE 0.9 % IJ SOLN: 3.0000 mL | INTRAMUSCULAR | Status: DC | PRN

## 2012-04-11 MED ORDER — NITROGLYCERIN 0.2 MG/ML ON CALL CATH LAB
INTRAVENOUS | Status: AC
  Filled 2012-04-11: qty 1

## 2012-04-11 MED ORDER — BIVALIRUDIN 250 MG IV SOLR
INTRAVENOUS | Status: AC
  Filled 2012-04-11: qty 250

## 2012-04-11 MED ORDER — ONDANSETRON HCL 4 MG/2ML IJ SOLN: 4.0000 mg | Freq: Four times a day (QID) | INTRAMUSCULAR | Status: DC | PRN

## 2012-04-11 MED ORDER — MIDAZOLAM HCL 2 MG/2ML IJ SOLN
INTRAMUSCULAR | Status: AC
  Filled 2012-04-11: qty 2

## 2012-04-11 MED ORDER — SODIUM CHLORIDE 0.9 % IV SOLN: INTRAVENOUS | Status: AC

## 2012-04-11 MED ORDER — CLOPIDOGREL BISULFATE 75 MG PO TABS
75.0000 mg | ORAL_TABLET | Freq: Every day | ORAL | Status: DC
  Administered 2012-04-12: 10:00:00 75 mg via ORAL
  Filled 2012-04-11: qty 1

## 2012-04-11 MED ORDER — NITROGLYCERIN 0.4 MG SL SUBL: 0.4000 mg | SUBLINGUAL_TABLET | SUBLINGUAL | Status: DC | PRN

## 2012-04-11 MED ORDER — ACETAMINOPHEN 325 MG PO TABS
650.0000 mg | ORAL_TABLET | ORAL | Status: DC | PRN
  Administered 2012-04-11: 15:00:00 650 mg via ORAL
  Filled 2012-04-11: qty 2

## 2012-04-11 MED ORDER — LIDOCAINE HCL (PF) 1 % IJ SOLN
INTRAMUSCULAR | Status: AC
  Filled 2012-04-11: qty 30

## 2012-04-11 MED ORDER — DIAZEPAM 5 MG PO TABS
5.0000 mg | ORAL_TABLET | ORAL | Status: AC
  Administered 2012-04-11: 5 mg via ORAL
  Filled 2012-04-11: qty 1

## 2012-04-11 MED ORDER — ASPIRIN 81 MG PO CHEW
324.0000 mg | CHEWABLE_TABLET | Freq: Once | ORAL | Status: AC
  Administered 2012-04-11: 324 mg via ORAL
  Filled 2012-04-11: qty 4

## 2012-04-11 MED ORDER — ADENOSINE 12 MG/4ML IV SOLN
16.0000 mL | Freq: Once | INTRAVENOUS | Status: DC
  Filled 2012-04-11: qty 16

## 2012-04-11 MED ORDER — HEPARIN (PORCINE) IN NACL 2-0.9 UNIT/ML-% IJ SOLN
INTRAMUSCULAR | Status: AC
  Filled 2012-04-11: qty 1000

## 2012-04-11 MED ORDER — ASPIRIN EC 81 MG PO TBEC
81.0000 mg | DELAYED_RELEASE_TABLET | Freq: Every day | ORAL | Status: DC
  Administered 2012-04-12: 81 mg via ORAL
  Filled 2012-04-11 (×2): qty 1

## 2012-04-11 MED ORDER — MELATONIN 3 MG PO TABS: 3.0000 mg | ORAL_TABLET | Freq: Every day | ORAL | Status: DC

## 2012-04-11 MED ORDER — SODIUM CHLORIDE 0.9 % IV SOLN
INTRAVENOUS | Status: DC
  Administered 2012-04-11: 09:00:00 via INTRAVENOUS

## 2012-04-11 MED ORDER — SODIUM CHLORIDE 0.9 % IJ SOLN: 3.0000 mL | Freq: Two times a day (BID) | INTRAMUSCULAR | Status: DC

## 2012-04-11 MED ORDER — SODIUM CHLORIDE 0.9 % IV SOLN: 250.0000 mL | INTRAVENOUS | Status: DC | PRN

## 2012-04-11 MED ORDER — ATORVASTATIN CALCIUM 40 MG PO TABS
40.0000 mg | ORAL_TABLET | Freq: Every day | ORAL | Status: DC
  Administered 2012-04-11: 40 mg via ORAL
  Filled 2012-04-11 (×2): qty 1

## 2012-04-11 NOTE — Interval H&P Note (Signed)
History and Physical Interval Note:  04/11/2012 9:53 AM  Richard Mahoney  has presented today for FFR of LAD with possible PCI of LAD with the diagnosis of chest pain/CAD/angina.  The various methods of treatment have been discussed with the patient and family. After consideration of risks, benefits and other options for treatment, the patient has consented to  Procedure(s) (LRB) with comments: PERCUTANEOUS CORONARY STENT INTERVENTION (PCI-S) (N/A) as a surgical intervention .  The patient's history has been reviewed, patient examined, no change in status, stable for surgery.  I have reviewed the patient's chart and labs.  Questions were answered to the patient's satisfaction.     MCALHANY,CHRISTOPHER

## 2012-04-11 NOTE — Progress Notes (Signed)
Utilization Review Completed.  

## 2012-04-11 NOTE — CV Procedure (Signed)
    Cardiac Catheterization Operative Report  Richard Mahoney [id number] 10/2/201311:19 AM [name], MD  Procedure Performed:  1. Fractional flow reserve of the mid LAD 2. PTCA/DES x 1 mid LAD  Operator: Verne Carrow, MD  Arterial access site:  Right radial artery.   Indication:  Class 3 exertional angina, abnormal stress myoview, diagnostic cath 04/06/12 with moderately severe disease mid LAD and moderate sized diagonal branch.                                    Procedure Details: The risks, benefits, complications, treatment options, and expected outcomes were discussed with the patient. The patient and/or family concurred with the proposed plan, giving informed consent. The patient was brought to the cath lab after IV hydration was begun and oral premedication was given. The patient was further sedated with Versed and Fentanyl. The right wrist was assessed with an Allens test which was positive. The right wrist was prepped and draped in a sterile fashion. 1% lidocaine was used for local anesthesia. Using the modified Seldinger access technique, a 5 French sheath was placed in the right radial artery. 3 mg Verapamil was given through the sheath. A bolus of Angiomax was given and a drip was started. He had been loaded with Plavix last week. I then engaged the left main with a XB LAD 3.5 guiding catheter. When the ACT was greater than 200, I passed a pressure wire down the LAD. Baseline FFR was 0.90. With IV adenosine, the FFR was 0.77 suggesting the lesion was flow limiting. I then elected to proceed to intervention of both the LAD and Diagonal branches. I passed a Cougar IC wire down the Diagonal branch. I then used a 2.5 x 10 mm Angiosculpt balloon into the ostium of the Diagonal branch and inflated to 8atm x 2, first for one minute then for 30 seconds. I then removed this balloon and passed a 2. 5 x 15mm balloon into the mid LAD and inflated x 1. I then carefully positioned  and deployed a 2.75 x 23 mm Xience Expedition stent in the mid LAD. This was post-dilated with a 3.0 x 15 mm [place] balloon x 2. There was an excellent angiographic result with good flow down the LAD and Diagonal branch. The stenosis in the LAD was taken from 80% down to 0%. This lesion was hazy in appearance before the PCI and flow limiting based on FFR. The diagonal ostial stenosis was taken from 50-70% down to 10%.   The sheath was removed from the right radial artery and a Terumo hemostasis band was applied at the arteriotomy site on the right wrist.    There were no immediate complications. The patient was taken to the recovery area in stable condition.   Hemodynamic Findings: Central aortic pressure: 120/70   Impression: 1. Single vessel CAD with hazy mid LAD stenosis, significant flow limitation based on FFR 2. Successful PTCA/DES x 1 mid LAD  Recommendations: Will continue ASA and Plavix for at least one year. Will continue statin. Will stop Imdur for now.        Complications:  None. The patient tolerated the procedure well.

## 2012-04-11 NOTE — H&P (View-Only) (Signed)
History of Present Illness: 66 yo male with history of kidney stones, possible GERD with strong family history of CAD with recent episodes of chest pain who is referred today for evaluation of chest pain. He was seen by Dr. Cato Mulligan in August 2013 and had c/o left sided chest pain with radiation into the left arm. This did not improve with a trial of PPI. Exercise stress test arranged by Dr. Cato Mulligan on 03/19/12 with no chest pain but ST depression concerning for ischemia. Stress myoview was ordered on 04/02/12. He had good exercise tolerance, no chest pain but ST depression at peak exercise and inferior wall defect concerning for ischemia. LV function normal.   He is here today to discuss the results of his test. He tells me that he has been having pain in his left chest for several months. There has been radiation into his left arm which feels heavy. He has had some improvement in his chest pain and arm pain since starting the PPI. He stills has some pinching left sided chest pain with exertion.   Primary Care Physician: Birdie Sons  Last Lipid Profile:Lipid Panel     Component Value Date/Time   CHOL 200 05/30/2011 0946   TRIG 153.0* 05/30/2011 0946   HDL 46.10 05/30/2011 0946   CHOLHDL 4 05/30/2011 0946   VLDL 30.6 05/30/2011 0946   LDLCALC 123* 05/30/2011 0946     Past Medical History  Diagnosis Date  . Nephrolithiasis     Past Surgical History  Procedure Date  . Hernia repair     inguinal   . Knee surgery     right knee-arthroscopic procedure    Current Outpatient Prescriptions  Medication Sig Dispense Refill  . aspirin 81 MG tablet Take 81 mg by mouth daily.      . Melatonin 3 MG TABS Take by mouth. 1 tab qhs      . omeprazole (PRILOSEC) 20 MG capsule Take 1 capsule (20 mg total) by mouth daily.  90 capsule  3    No Known Allergies  History   Social History  . Marital Status: Married    Spouse Name: N/A    Number of Children: 2  . Years of Education: N/A    Occupational History  . Retired    Social History Main Topics  . Smoking status: Never Smoker   . Smokeless tobacco: Never Used  . Alcohol Use: No  . Drug Use: No  . Sexually Active: Not on file   Other Topics Concern  . Not on file   Social History Narrative  . No narrative on file    Family History  Problem Relation Age of Onset  . Diabetes Mother   . Heart attack Mother 29  . Diabetes Father   . Heart attack Father 3  . Diabetes Sister   . Arthritis Brother     Review of Systems:  As stated in the HPI and otherwise negative.   BP 153/87  Pulse 74  Ht 6' (1.829 m)  Wt 199 lb (90.266 kg)  BMI 26.99 kg/m2  Physical Examination: General: Well developed, well nourished, NAD HEENT: OP clear, mucus membranes moist SKIN: warm, dry. No rashes. Neuro: No focal deficits Musculoskeletal: Muscle strength 5/5 all ext Psychiatric: Mood and affect normal Neck: No JVD, no carotid bruits, no thyromegaly, no lymphadenopathy. Lungs:Clear bilaterally, no wheezes, rhonci, crackles Cardiovascular: Regular rate and rhythm. No murmurs, gallops or rubs. Abdomen:Soft. Bowel sounds present. Non-tender.  Extremities: No lower extremity edema.  Pulses are 2 + in the bilateral DP/PT.  EKG:  Exercise stress myoview: 04/02/12:  Stress Procedure: The patient exercised on the teadmill utilizing the Bruce protocol for 10:03 minutes. He then stopped due to fatigue and denied any chest pain. There were ST-T wave changes with exercise, that quickly resolved with recovery. Technetium 60m Sestamibi was injected at peak exercise and myocardial perfusion imaging was performed after a brief delay.  Stress ECG: Significant ST abnormalities consistent with ischemia.  QPS  Raw Data Images: Normal; no motion artifact; normal heart/lung ratio.  Stress Images: There is decreased uptake in the inferior wall.  Rest Images: There is decreased uptake in the inferior wall.  Subtraction (SDS): Suggestive of  mild reversible ischemia of small size in basal inferior and basal inferoseptal areas. Diaphragmatic attenuation cannot be excluded.  Transient Ischemic Dilatation (Normal <1.22): 0.95  Lung/Heart Ratio (Normal <0.45): 0.20  Quantitative Gated Spect Images  QGS EDV: 85 ml  QGS ESV: 32 ml  Impression  Exercise Capacity: Good exercise capacity.  BP Response: Hypertensive blood pressure response.  Clinical Symptoms: No chest pain.  ECG Impression: Significant ST abnormalities consistent with ischemia.  Comparison with Prior Nuclear Study: No previous nuclear study performed  Overall Impression: Abnormal stress nuclear study.  LV Ejection Fraction: 62%. LV Wall Motion: NL LV Function; NL Wall Motion  Discussion: Patient had no chest pain with exercise. He does develop 2mm ST depression V2-V5 with exercise associated with small area of reversible ischemia of moderate severity in the basal inferior and basal inferoseptal distribution. Diaphragmatic attenuation is possible and there are no segmental wall motion abnormalities.  Would consider diagnostic cath.   Assessment and Plan:   1. Chest pain concerning for unstable angina/abnormal myoview: Patient with strong family history of CAD and abnormal myoview suggesting inferior wall ischemia with exercise induced ST depression at peak exercise. This is concerning for obstructive CAD. I have discussed a cardiac cath to exclude CAD. I have reviewed the risks and benefits and he agrees to proceed. Will arrange in outpt cath lab on Friday 04/06/12. Will call with time in am. Pre-cath labs today. He prefers the groin approach.

## 2012-04-11 NOTE — Progress Notes (Signed)
Pt requested to take own melatonin at hs , md notified okayed with pt's request, pharmacy notified , unable to send and receipt pt own med to pharmacydue to med unlabeled. Pt advised of this and agreed not to take own med. Does not want other sleeping pill formulary from hosp.

## 2012-04-11 NOTE — Progress Notes (Signed)
TR BAND REMOVAL  LOCATION:    right radial  DEFLATED PER PROTOCOL:    yes  TIME BAND OFF / DRESSING APPLIED:    1430   SITE UPON ARRIVAL:    Level 0  SITE AFTER BAND REMOVAL:    Level 0  REVERSE ALLEN'S TEST:     positive  CIRCULATION SENSATION AND MOVEMENT:    Within Normal Limits   yes  COMMENTS:  Tolerated procedure well 

## 2012-04-11 NOTE — Progress Notes (Signed)
PHARMACIST - PHYSICIAN ORDER COMMUNICATION  CONCERNING: P&T Medication Policy on Herbal Medications  DESCRIPTION:  This patient's order for:  Melatonin  has been noted.  This product(s) is classified as an "herbal" or natural product. Due to a lack of definitive safety studies or FDA approval, nonstandard manufacturing practices, plus the potential risk of unknown drug-drug interactions while on inpatient medications, the Pharmacy and Therapeutics Committee does not permit the use of "herbal" or natural products of this type within [facility].   ACTION TAKEN: The pharmacy department is unable to verify this order at this time and your patient has been informed of this safety policy. Please reevaluate patient's clinical condition at discharge and address if the herbal or natural product(s) should be resumed at that time.  Thank you,  Brett Fairy, PharmD 04/11/2012 1:20 PM

## 2012-04-12 ENCOUNTER — Encounter (HOSPITAL_COMMUNITY): Payer: Self-pay | Admitting: Physician Assistant

## 2012-04-12 DIAGNOSIS — I200 Unstable angina: Secondary | ICD-10-CM

## 2012-04-12 DIAGNOSIS — I2510 Atherosclerotic heart disease of native coronary artery without angina pectoris: Secondary | ICD-10-CM

## 2012-04-12 LAB — CBC
HCT: 37.9 % — ABNORMAL LOW (ref 39.0–52.0)
MCH: 31.4 pg (ref 26.0–34.0)
MCHC: 34.8 g/dL (ref 30.0–36.0)
MCV: 90.2 fL (ref 78.0–100.0)
Platelets: 224 10*3/uL (ref 150–400)
RDW: 12.3 % (ref 11.5–15.5)
WBC: 5.6 10*3/uL (ref 4.0–10.5)

## 2012-04-12 LAB — BASIC METABOLIC PANEL
BUN: 20 mg/dL (ref 6–23)
Calcium: 9.3 mg/dL (ref 8.4–10.5)
Chloride: 104 mEq/L (ref 96–112)
Creatinine, Ser: 1.16 mg/dL (ref 0.50–1.35)
GFR calc Af Amer: 73 mL/min — ABNORMAL LOW (ref >90)
GFR calc non Af Amer: 63 mL/min — ABNORMAL LOW (ref >90)

## 2012-04-12 MED FILL — Dextrose Inj 5%: INTRAVENOUS | Qty: 50 | Status: AC

## 2012-04-12 NOTE — Progress Notes (Signed)
CARDIAC REHAB PHASE I   PRE:  Rate/Rhythm: 68 SR    BP: sitting 120/78    SaO2: 97 RA  MODE:  Ambulation: 800 ft   POST:  Rate/Rhythm: 80 SR    BP: sitting 134/78     SaO2:   Tolerated well, no c/o. Ed completed. Requests his name be sent to [facility].  [id number]  Harriet Masson CES, ACSM

## 2012-04-12 NOTE — Discharge Summary (Signed)
See full note this am. cdm 

## 2012-04-12 NOTE — Progress Notes (Signed)
    SUBJECTIVE: No chest pain/SOB. No events overnight.   BP 115/76  Pulse 66  Temp 98 F (36.7 C) (Oral)  Resp 10  Ht 6' (1.829 m)  Wt 198 lb 10.2 oz (90.1 kg)  BMI 26.94 kg/m2  SpO2 97%  Intake/Output Summary (Last 24 hours) at 04/12/12 0654 Last data filed at 04/11/12 2200  Gross per 24 hour  Intake    880 ml  Output    750 ml  Net    130 ml    PHYSICAL EXAM General: Well developed, well nourished, in no acute distress. Alert and oriented x 3.  Psych:  Good affect, responds appropriately Neck: No JVD. No masses noted.  Lungs: Clear bilaterally with no wheezes or rhonci noted.  Heart: RRR with no murmurs noted. Abdomen: Bowel sounds are present. Soft, non-tender.  Extremities: No lower extremity edema. Right wrist cath site ok.   LABS: Basic Metabolic Panel:  Basename 04/11/12 0835  NA 139  K 4.0  CL 103  CO2 26  GLUCOSE 99  BUN 20  CREATININE 1.13  CALCIUM 9.4  MG --  PHOS --   CBC:  Basename 04/11/12 0835  WBC 5.4  NEUTROABS --  HGB 13.7  HCT 38.8*  MCV 89.0  PLT 220   Current Meds:    . aspirin  324 mg Oral Once  . aspirin EC  81 mg Oral Daily  . atorvastatin  40 mg Oral q1800  . bivalirudin      . clopidogrel  75 mg Oral Daily  . diazepam  5 mg Oral On Call  . fentaNYL      . heparin      . lidocaine      . midazolam      . nitroGLYCERIN      . verapamil      . DISCONTD: adenosine  16 mL Intravenous Once  . DISCONTD: Melatonin  3 mg Oral QHS  . DISCONTD: sodium chloride  3 mL Intravenous Q12H     ASSESSMENT AND PLAN:  1. Unstable angina, class 3: s/p DES x 1 mid LAD after FFR demonstrated the lesion was flow limiting. Diagonal angioplasty at ostium with Angiosculpt balloon.  He is on ASA and Plavix. Will need dual antiplatelet therapy for one year.  PRU is 164 (platelet inhibition is excellent). Continue statin. Will stop Imdur.   2. CAD: as above  3. Dispo: D/C home today if labs ok. Follow up with me 3-4 weeks.     Richard Mahoney  10/3/20136:54 AM

## 2012-04-12 NOTE — Discharge Summary (Signed)
CARDIOLOGY DISCHARGE SUMMARY   Patient ID: Richard Mahoney MRN: [id number] DOB/AGE: 1946-04-13 66 y.o.  Admit date: 04/11/2012 Discharge date: 04/12/2012  Primary Discharge Diagnosis:   Unstable angina - s/p 2.75 x 23 mm Xience Expedition stent in the mid LAD.  Coronary atherosclerosis of native coronary artery  Secondary Discharge Diagnosis:  Past Medical History  Diagnosis Date  . Nephrolithiasis   . GERD (gastroesophageal reflux disease)   . Hyperlipidemia LDL goal < 70    Procedures:  1. Fractional flow reserve of the mid LAD  2. PTCA/DES x 1 mid LAD  Hospital Course: Richard Mahoney is a 66 year old male with no previous history of coronary artery disease. He was referred for a stress test by Dr. Cato Mulligan. It was abnormal and he was having recurrent chest pain. He was scheduled for a JV catheterization that was performed on 04/06/2002. There was concern about a lesion in his LAD. He was loaded on Plavix and brought back to the lab on 04/11/2012 for FFR the LAD and possible stenting. The results are described below.  He tolerated the procedure well. He was held overnight. He was seen by cardiac rehabilitation and given information on a heart healthy diet. On 04/12/2012, he was seen by Dr. [name]. A P2Y12 test was performed and showed excellent platelet inhibition at 164. His post procedure labs showed no significant abnormalities but his blood glucose was borderline high at 100. He was ambulating without chest pain or shortness of breath and considered stable for discharge, to follow up as an outpatient.  Labs:   Lab Results  Component Value Date   WBC 5.6 04/12/2012   HGB 13.2 04/12/2012   HCT 37.9* 04/12/2012   MCV 90.2 04/12/2012   PLT 224 04/12/2012    Lab 04/12/12 0535  NA 140  K 4.1  CL 104  CO2 27  BUN 20  CREATININE 1.16  CALCIUM 9.3  PROT --  BILITOT --  ALKPHOS --  ALT --  AST --  GLUCOSE 100*   Lipid Panel     Component Value Date/Time   CHOL 200  05/30/2011 0946   TRIG 153.0* 05/30/2011 0946   HDL 46.10 05/30/2011 0946   CHOLHDL 4 05/30/2011 0946   VLDL 30.6 05/30/2011 0946   LDLCALC 123* 05/30/2011 0946   Cardiac Cath:  Baseline FFR was 0.90. With IV adenosine, the FFR was 0.77 suggesting the lesion was flow limiting. 2.75 x 23 mm Xience Expedition stent in the mid LAD.  EKG: 12-Apr-2012 04:10:34  Normal sinus rhythm Normal ECG 64mm/s 68mm/mV 100Hz  8.0.1 12SL 241 HD CID: 1 Referred by: CHRISTOPHE MCALHANY Unconfirmed Vent. rate 64 BPM PR interval 190 ms QRS duration 90 ms QT/QTc 426/439 ms P-R-T axes 76 67 57  FOLLOW UP PLANS AND APPOINTMENTS No Known Allergies   Medication List     As of 04/12/2012  9:15 AM    STOP taking these medications         isosorbide mononitrate 30 MG 24 hr tablet   Commonly known as: IMDUR      TAKE these medications         aspirin EC 81 MG tablet   Take 81 mg by mouth daily.      atorvastatin 40 MG tablet   Commonly known as: LIPITOR   Take 40 mg by mouth daily.      clopidogrel 75 MG tablet   Commonly known as: PLAVIX   Take 75 mg by mouth daily.  Melatonin 3 MG Tabs   Take 3 mg by mouth at bedtime.      nitroGLYCERIN 0.4 MG SL tablet   Commonly known as: NITROSTAT   Place 0.4 mg under the tongue every 5 (five) minutes as needed. For chest pain           Discharge Orders    Future Appointments: Provider: Department: Dept Phone: Center:   04/26/2012 2:00 PM Beatrice Lecher, PA [facility] [phone] [facility]   05/14/2012 3:30 PM Kathleene Hazel, MD [facility] [phone] [facility]     Future Orders Please Complete By Expires   Amb Referral to Cardiac Rehabilitation      Comments:   Pt agrees to Outpt. CRP in [place],will send referral.   Diet - low sodium heart healthy      Increase activity slowly        Follow-up Information    Follow up with Tereso Newcomer, PA. On 04/26/2012. (at 2:00 pm )    Contact information:     [address] [place] [address] [phone]          BRING ALL MEDICATIONS WITH YOU TO FOLLOW UP APPOINTMENTS  Time spent with patient to include physician time: 36 min Signed: Theodore Demark 04/12/2012, 9:15 AM Co-Sign MD

## 2012-04-17 ENCOUNTER — Telehealth: Payer: Self-pay | Admitting: Cardiovascular Disease

## 2012-04-17 NOTE — Telephone Encounter (Signed)
Patient had a cardiac catheterization and stent placement on 04/11/12. Pt states is doing well, except sometimes, he feels a mild sting like sensation inside left  Side of chest, like when using rubbing alcohol on the skin,and left arm tingling. Patient states he  is not concern, because it is nothing like before he had the procedure. Pt has not needed  To  take NTG. Just wants to know if this is normal. Patient is aware that this message will be send to MD's desktop, And if symptoms get worse and he is concern about  , pt is to go to the ER. Pt verbalized understanding.

## 2012-04-17 NOTE — Telephone Encounter (Addendum)
Pt has some questions re procedure he had done a couple weeks ago, dr [name] told to call if needed anything

## 2012-04-18 ENCOUNTER — Encounter: Payer: Self-pay | Admitting: Cardiovascular Disease

## 2012-04-18 NOTE — Telephone Encounter (Signed)
Will forward this message from pt via  my chart to Dr. [name] .         From: Richard Mahoney       Sent: 04/18/2012  12:39 PM         To: [name] Triage    Subject: Non-Urgent Medical Question                           Dr. [name],  I wanted to send you a note after calling yesterday.  Wanted to talk with your nurse [name] but neither of you were available so I shared info with triage nurse and she said that she would pass it on to you. Just a few questions concerning my condition following the placement of the stent last week.  I don't want to be an alarmist but want to understand what I should be experiencing following the procedure. I have had some discomfort in my chest in the last few days, nothing as severe as before the stent, yet feelings similar to those before.  I have not taken any nitro glycerin tablets and haven't considered the pain severe.  It is somewhat of a burning twinge in my upper chest about shoulder level high, something similar to maybe getting alcohol in a raw spot on the skin.  Hurts a little while then goes away until later.  I have been walking and experience no additional pain as a result of the walking. Blood pressure has averaged around 118/74 with pulse around 68 BPM. Would like your take on the burning sensation in upper chest area , especially if this is something that I should expect to have. Have had a little tingling in my arms but this seems to have improved as the needle-stick sites have gotten better.  Also wondering if some of the pressure feeling might be coming from the artery that you cleaned and could get better after treating with the medication.  I have been contacted by [facility] for their cardiac rehab program but kind of wanted to get these questions answered before I get too active with the exercise. I have an appointment with Dr. [name] 04/25/12 at 10:10 AM.    I guess the biggest thing is that I have nothing to compare how I should feel after  something like this and I don't want to be an alarmist; however, neither do I want to ignore what might be potential warning signs that I should listen to.         Thank you,    Richard Mahoney     [phone]    [email or web address]

## 2012-04-18 NOTE — Telephone Encounter (Signed)
I spoke to the patient today and addressed his questions. cdm

## 2012-04-22 ENCOUNTER — Encounter: Payer: Self-pay | Admitting: Internal Medicine

## 2012-04-25 ENCOUNTER — Ambulatory Visit (INDEPENDENT_AMBULATORY_CARE_PROVIDER_SITE_OTHER): Payer: Medicare Other | Admitting: Physician Assistant

## 2012-04-25 ENCOUNTER — Encounter: Payer: Self-pay | Admitting: Physician Assistant

## 2012-04-25 VITALS — BP 126/80 | HR 91 | Resp 18 | Ht 72.0 in | Wt 196.1 lb

## 2012-04-25 DIAGNOSIS — E785 Hyperlipidemia, unspecified: Secondary | ICD-10-CM

## 2012-04-25 DIAGNOSIS — I2510 Atherosclerotic heart disease of native coronary artery without angina pectoris: Secondary | ICD-10-CM

## 2012-04-25 NOTE — Patient Instructions (Addendum)
Your physician recommends that you return for lab work in: 07/10/12 FOR FASTING CHOLESTEROL PANEL COME IN BETWEEN 8:30-9 AM  Your physician recommends that you schedule a follow-up appointment in: 07/12/12 @ 10:30 AM WITH DR. [name]

## 2012-04-25 NOTE — Progress Notes (Signed)
[address] [place], [place]  [address] Phone: [phone] Fax:  [phone]  Date:  04/25/2012   Name:  Richard Mahoney   DOB:  Aug 05, 1945   MRN:  [id number]  PCP:  [name], MD  Primary Cardiologist:  Dr. Verne Carrow  Primary Electrophysiologist:  None    History of Present Illness: Richard Mahoney is a 66 y.o. male who returns for follow up after recent hospitalization.  Patient was recently referred for exercise treadmill test due to chest pain. This was abnormal and he was set up for a nuclear scan. This demonstrated inferior ischemia with normal LV function. He was therefore set up for cardiac catheterization. This demonstrated single vessel CAD with complex stenosis involving the mid LAD. EF was 65%. He was brought back for FFR measurement of the LAD lesion with an eye towards PCI. This was performed on 04/11/12. FFR demonstrated hemodynamically significant lesion in the mid LAD which was treated with a Xience Xpedition DES. He had good response to Plavix on PRU testing.    He is doing well.  The patient denies chest pain, shortness of breath, syncope, orthopnea, PND or significant pedal edema.   Labs (11/12):  LDL 123, TSH 1.97 Labs (10/13):  K 4.1, creatinine 1.16   Wt Readings from Last 3 Encounters:  04/25/12 196 lb 1.9 oz (88.959 kg)  04/12/12 198 lb 10.2 oz (90.1 kg)  04/12/12 198 lb 10.2 oz (90.1 kg)     Past Medical History  Diagnosis Date  . Nephrolithiasis   . Coronary artery disease     a. ETT 9/13: + ischemic ECG changes;  b. ETT-MV 9/13: EF 62%;  + ischemic EKG changes and + inf ischemia;  c. LHC 9/13: pLAD 30%, mLAD 70-80%, oDx 40-50% ==> FFR hemodynamically significant stenosis in mLAD ==> PCI: Xience Xpedition DES to mLAD  . GERD (gastroesophageal reflux disease)   . Hyperlipidemia LDL goal < 70     Current Outpatient Prescriptions  Medication Sig Dispense Refill  . aspirin EC 81 MG tablet Take 81 mg by  mouth daily.      Marland Kitchen atorvastatin (LIPITOR) 40 MG tablet Take 40 mg by mouth daily.      . clopidogrel (PLAVIX) 75 MG tablet Take 75 mg by mouth daily.      . Melatonin 3 MG TABS Take 3 mg by mouth at bedtime.       . nitroGLYCERIN (NITROSTAT) 0.4 MG SL tablet Place 0.4 mg under the tongue every 5 (five) minutes as needed. For chest pain        Allergies: No Known Allergies  History  Substance Use Topics  . Smoking status: Never Smoker   . Smokeless tobacco: Never Used  . Alcohol Use: No      PHYSICAL EXAM: VS:  BP 126/80  Pulse 91  Resp 18  Ht 6' (1.829 m)  Wt 196 lb 1.9 oz (88.959 kg)  BMI 26.60 kg/m2  SpO2 99% Well nourished, well developed, in no acute distress HEENT: normal Neck: no JVD Cardiac:  normal S1, S2; RRR; no murmur Lungs:  clear to auscultation bilaterally, no wheezing, rhonchi or rales Abd: soft, nontender, no hepatomegaly Ext: no edema; right wrist without hematoma or bruit  Skin: warm and dry Neuro:  CNs 2-12 intact, no focal abnormalities noted  EKG:  NSR, HR 63, normal axis      ASSESSMENT AND PLAN:  1. Coronary Artery Disease:  Doing well post PCI.  He plans to attend cardiac rehab in [place].  We discussed the importance of dual antiplatelet therapy.  Follow up with Dr. Verne Carrow in 2-3 mos.  2. Hyperlipidemia:  Continue Lipitor.  Check Lipids and LFTs in 6-8 weeks.  Signed, Tereso Newcomer, PA-C  10:45 AM 04/25/2012

## 2012-04-26 ENCOUNTER — Encounter: Payer: Medicare Other | Admitting: Physician Assistant

## 2012-05-14 ENCOUNTER — Telehealth: Payer: Self-pay | Admitting: *Deleted

## 2012-05-14 ENCOUNTER — Ambulatory Visit: Payer: Medicare Other | Admitting: Cardiovascular Disease

## 2012-05-14 NOTE — Telephone Encounter (Signed)
Paperwork completed. Medical records will fax today

## 2012-05-14 NOTE — Telephone Encounter (Signed)
Received phone call from Joesph July at [facility].  She states referral was sent to our office on Friday for Dr. [name] to sign.  Pt is to start rehab tomorrow and needs this form completed before he can start.  I told her I would look for this paperwork.

## 2012-05-18 ENCOUNTER — Encounter: Payer: Self-pay | Admitting: Cardiovascular Disease

## 2012-05-21 ENCOUNTER — Encounter: Payer: Self-pay | Admitting: Internal Medicine

## 2012-05-22 ENCOUNTER — Telehealth: Payer: Self-pay | Admitting: *Deleted

## 2012-05-22 NOTE — Telephone Encounter (Signed)
error 

## 2012-05-22 NOTE — Telephone Encounter (Signed)
Mr. Krumwiede,  I see Dr. [name] already answered your questions.    Sincerely, Charlotte Crumb, RN

## 2012-05-29 ENCOUNTER — Encounter: Payer: Self-pay | Admitting: Cardiovascular Disease

## 2012-05-30 ENCOUNTER — Telehealth: Payer: Self-pay | Admitting: *Deleted

## 2012-05-30 NOTE — Telephone Encounter (Signed)
Spoke with pt regarding my chart message sent earlier today. Message reviewed with Dr. [name].  Pt reports he went to rehab today and was able to exercise without problems.  He states he doesn't feel like he is working hard but his heart rate at rehab is exceeding the target heart rate.  He reports heaviness in chest which he describes as a dull feeling.  This started about 4-5 days ago. It is not associated with exercise.  Similar to pain he had prior to stent but not as bad. He has not needed NTG and pain goes away on own.  I told him he could use NTG if needed.  I offered pt appt to be seen in office today with Norma Fredrickson, NP but he would like to wait to be seen by Dr. [name] this Friday. Appt made for June 01, 2012 at 11:00.  I have asked him to take it easy until then.  He is aware to go to ED if problems prior to appt.

## 2012-06-01 ENCOUNTER — Observation Stay (HOSPITAL_COMMUNITY)
Admission: AD | Admit: 2012-06-01 | Discharge: 2012-06-02 | Disposition: A | Discharge status: Home / Self Care | Payer: PRIVATE HEALTH INSURANCE | Source: Ambulatory Visit | Attending: Cardiovascular Disease | Admitting: Cardiovascular Disease

## 2012-06-01 ENCOUNTER — Encounter: Payer: Self-pay | Admitting: Cardiovascular Disease

## 2012-06-01 ENCOUNTER — Encounter (HOSPITAL_COMMUNITY)
Admission: AD | Disposition: A | Discharge status: Home / Self Care | Payer: Self-pay | Source: Ambulatory Visit | Attending: Cardiovascular Disease

## 2012-06-01 ENCOUNTER — Encounter (HOSPITAL_COMMUNITY): Payer: Self-pay | Admitting: *Deleted

## 2012-06-01 ENCOUNTER — Ambulatory Visit (INDEPENDENT_AMBULATORY_CARE_PROVIDER_SITE_OTHER): Payer: PRIVATE HEALTH INSURANCE | Admitting: Cardiovascular Disease

## 2012-06-01 VITALS — BP 138/88 | HR 55 | Ht 72.0 in | Wt 192.4 lb

## 2012-06-01 DIAGNOSIS — R079 Chest pain, unspecified: Secondary | ICD-10-CM

## 2012-06-01 DIAGNOSIS — I2510 Atherosclerotic heart disease of native coronary artery without angina pectoris: Secondary | ICD-10-CM | POA: Diagnosis not present

## 2012-06-01 DIAGNOSIS — Z9861 Coronary angioplasty status: Secondary | ICD-10-CM | POA: Insufficient documentation

## 2012-06-01 DIAGNOSIS — E785 Hyperlipidemia, unspecified: Secondary | ICD-10-CM | POA: Insufficient documentation

## 2012-06-01 DIAGNOSIS — N200 Calculus of kidney: Secondary | ICD-10-CM | POA: Insufficient documentation

## 2012-06-01 DIAGNOSIS — IMO0001 Reserved for inherently not codable concepts without codable children: Secondary | ICD-10-CM | POA: Insufficient documentation

## 2012-06-01 DIAGNOSIS — K219 Gastro-esophageal reflux disease without esophagitis: Secondary | ICD-10-CM | POA: Insufficient documentation

## 2012-06-01 DIAGNOSIS — I200 Unstable angina: Secondary | ICD-10-CM

## 2012-06-01 HISTORY — DX: Fibromyalgia: M79.7

## 2012-06-01 HISTORY — PX: LEFT HEART CATHETERIZATION WITH CORONARY ANGIOGRAM: SHX5451

## 2012-06-01 LAB — COMPREHENSIVE METABOLIC PANEL
BUN: 26 mg/dL — ABNORMAL HIGH (ref 6–23)
CO2: 27 mEq/L (ref 19–32)
Calcium: 8.9 mg/dL (ref 8.4–10.5)
Creatinine, Ser: 1.08 mg/dL (ref 0.50–1.35)
GFR calc Af Amer: 79 mL/min — ABNORMAL LOW (ref >90)
GFR calc non Af Amer: 69 mL/min — ABNORMAL LOW (ref >90)
Glucose, Bld: 92 mg/dL (ref 70–99)
Total Protein: 6.5 g/dL (ref 6.0–8.3)

## 2012-06-01 LAB — CBC WITH DIFFERENTIAL/PLATELET
Eosinophils Absolute: 0.2 10*3/uL (ref 0.0–0.7)
Eosinophils Relative: 5 % (ref 0–5)
HCT: 36.3 % — ABNORMAL LOW (ref 39.0–52.0)
Lymphocytes Relative: 42 % (ref 12–46)
Lymphs Abs: 1.4 10*3/uL (ref 0.7–4.0)
MCH: 31.2 pg (ref 26.0–34.0)
MCV: 90.5 fL (ref 78.0–100.0)
Monocytes Absolute: 0.3 10*3/uL (ref 0.1–1.0)
RBC: 4.01 MIL/uL — ABNORMAL LOW (ref 4.22–5.81)
RDW: 12.1 % (ref 11.5–15.5)
WBC: 3.3 10*3/uL — ABNORMAL LOW (ref 4.0–10.5)

## 2012-06-01 LAB — MAGNESIUM: Magnesium: 1.9 mg/dL (ref 1.5–2.5)

## 2012-06-01 SURGERY — LEFT HEART CATHETERIZATION WITH CORONARY ANGIOGRAM
Anesthesia: LOCAL

## 2012-06-01 MED ORDER — PRASUGREL HCL 10 MG PO TABS
10.0000 mg | ORAL_TABLET | Freq: Every day | ORAL | Status: DC
  Administered 2012-06-02: 10:00:00 10 mg via ORAL
  Filled 2012-06-01 (×2): qty 1

## 2012-06-01 MED ORDER — ASPIRIN EC 81 MG PO TBEC: 81.0000 mg | DELAYED_RELEASE_TABLET | Freq: Every day | ORAL | Status: DC

## 2012-06-01 MED ORDER — NITROGLYCERIN 0.4 MG SL SUBL: 0.4000 mg | SUBLINGUAL_TABLET | SUBLINGUAL | Status: DC | PRN

## 2012-06-01 MED ORDER — VERAPAMIL HCL 2.5 MG/ML IV SOLN
INTRAVENOUS | Status: AC
  Filled 2012-06-01: qty 2

## 2012-06-01 MED ORDER — PRASUGREL HCL 10 MG PO TABS
60.0000 mg | ORAL_TABLET | Freq: Once | ORAL | Status: AC
  Administered 2012-06-01: 60 mg via ORAL
  Filled 2012-06-01 (×2): qty 6

## 2012-06-01 MED ORDER — FENTANYL CITRATE 0.05 MG/ML IJ SOLN
INTRAMUSCULAR | Status: AC
  Filled 2012-06-01: qty 2

## 2012-06-01 MED ORDER — ASPIRIN 81 MG PO CHEW
324.0000 mg | CHEWABLE_TABLET | ORAL | Status: DC
  Filled 2012-06-01: qty 4

## 2012-06-01 MED ORDER — SODIUM CHLORIDE 0.9 % IV SOLN: 250.0000 mL | INTRAVENOUS | Status: DC | PRN

## 2012-06-01 MED ORDER — NITROGLYCERIN 0.2 MG/ML ON CALL CATH LAB
INTRAVENOUS | Status: AC
  Filled 2012-06-01: qty 1

## 2012-06-01 MED ORDER — LIDOCAINE HCL (PF) 1 % IJ SOLN
INTRAMUSCULAR | Status: AC
  Filled 2012-06-01: qty 30

## 2012-06-01 MED ORDER — DIAZEPAM 5 MG PO TABS
5.0000 mg | ORAL_TABLET | ORAL | Status: DC | PRN
  Administered 2012-06-01: 5 mg via ORAL
  Filled 2012-06-01: qty 1

## 2012-06-01 MED ORDER — ASPIRIN 300 MG RE SUPP
300.0000 mg | RECTAL | Status: AC
  Filled 2012-06-01: qty 1

## 2012-06-01 MED ORDER — DIAZEPAM 5 MG PO TABS: 5.0000 mg | ORAL_TABLET | ORAL | Status: DC

## 2012-06-01 MED ORDER — SODIUM CHLORIDE 0.9 % IV SOLN
INTRAVENOUS | Status: DC
  Administered 2012-06-01: 14:00:00 via INTRAVENOUS

## 2012-06-01 MED ORDER — HEPARIN (PORCINE) IN NACL 2-0.9 UNIT/ML-% IJ SOLN
INTRAMUSCULAR | Status: AC
  Filled 2012-06-01: qty 1000

## 2012-06-01 MED ORDER — OXYCODONE-ACETAMINOPHEN 5-325 MG PO TABS
1.0000 | ORAL_TABLET | ORAL | Status: DC | PRN
  Administered 2012-06-02: 02:00:00 2 via ORAL
  Filled 2012-06-01: qty 2

## 2012-06-01 MED ORDER — MIDAZOLAM HCL 2 MG/2ML IJ SOLN
INTRAMUSCULAR | Status: AC
  Filled 2012-06-01: qty 2

## 2012-06-01 MED ORDER — SODIUM CHLORIDE 0.9 % IJ SOLN
3.0000 mL | Freq: Two times a day (BID) | INTRAMUSCULAR | Status: DC
  Administered 2012-06-01: 3 mL via INTRAVENOUS

## 2012-06-01 MED ORDER — ATORVASTATIN CALCIUM 40 MG PO TABS
40.0000 mg | ORAL_TABLET | Freq: Every day | ORAL | Status: DC
Start: 2012-06-01 — End: 2012-06-02
  Administered 2012-06-01: 40 mg via ORAL
  Filled 2012-06-01 (×3): qty 1

## 2012-06-01 MED ORDER — HEPARIN SODIUM (PORCINE) 1000 UNIT/ML IJ SOLN
INTRAMUSCULAR | Status: AC
  Filled 2012-06-01: qty 1

## 2012-06-01 MED ORDER — ONDANSETRON HCL 4 MG/2ML IJ SOLN: 4.0000 mg | Freq: Four times a day (QID) | INTRAMUSCULAR | Status: DC | PRN

## 2012-06-01 MED ORDER — ACETAMINOPHEN 325 MG PO TABS: 650.0000 mg | ORAL_TABLET | ORAL | Status: DC | PRN

## 2012-06-01 MED ORDER — CLOPIDOGREL BISULFATE 75 MG PO TABS: 75.0000 mg | ORAL_TABLET | Freq: Every day | ORAL | Status: DC

## 2012-06-01 MED ORDER — ISOSORBIDE MONONITRATE ER 30 MG PO TB24
30.0000 mg | ORAL_TABLET | Freq: Every day | ORAL | Status: DC
  Administered 2012-06-01 – 2012-06-02 (×2): 30 mg via ORAL
  Filled 2012-06-01 (×2): qty 1

## 2012-06-01 MED ORDER — SODIUM CHLORIDE 0.9 % IJ SOLN: 3.0000 mL | INTRAMUSCULAR | Status: DC | PRN

## 2012-06-01 MED ORDER — PANTOPRAZOLE SODIUM 40 MG PO TBEC
40.0000 mg | DELAYED_RELEASE_TABLET | Freq: Every day | ORAL | Status: DC
  Administered 2012-06-01 – 2012-06-02 (×2): 40 mg via ORAL
  Filled 2012-06-01 (×2): qty 1

## 2012-06-01 MED ORDER — SODIUM CHLORIDE 0.9 % IV SOLN
INTRAVENOUS | Status: AC
  Administered 2012-06-01: 17:00:00 via INTRAVENOUS

## 2012-06-01 MED ORDER — ASPIRIN 81 MG PO CHEW
324.0000 mg | CHEWABLE_TABLET | ORAL | Status: AC
  Administered 2012-06-01: 324 mg via ORAL

## 2012-06-01 NOTE — H&P (Signed)
History of Present Illness:66 yo male with history of CAD, kidney stones, possible GERD with strong family history of CAD who was seen in September 2013 for evaluation of chest pain. Exercise stress test arranged by Dr. Cato Mulligan on 03/19/12 with no chest pain but ST depression concerning for ischemia. Stress myoview was ordered on 04/02/12. He had good exercise tolerance, no chest pain but ST depression at peak exercise and inferior wall defect concerning for ischemia. Cardiac cath on 04/06/12 with moderately severe mid LAD lesion involving the diagonal branch. He was brought back for PCI on 04/11/12 and FFR of the LAD was 0.77. A Xience Expedition (2.75 mm x 23 mm) drug eluting stent was placed in the mid LAD and angioplasty was performed on the ostium of the diagonal. He had good response to Plavix on PRU testing. He was seen in f/u by Tereso Newcomer, PA-C post cath on 04/25/12 and was doing well.  He is added on today for evaluation of chest pain. He tells me that he has been feeling a dull chest pain for last five days. He has been in cardiac rehab and had no pain during exercise. His HR has been elevated at cardiac rehab. Over the last few days he has had heaviness in his chest with radiation into his left arm. He is feeling poorly today. He is having heaviness in his chest and his left arm feels numb. No palpitations, SOB or dizziness.  Primary Care Physician: Birdie Sons  Last Lipid Profile:Lipid Panel    Component  Value  Date/Time    CHOL  200  05/30/2011 0946    TRIG  153.0*  05/30/2011 0946    HDL  46.10  05/30/2011 0946    CHOLHDL  4  05/30/2011 0946    VLDL  30.6  05/30/2011 0946    LDLCALC  123*  05/30/2011 0946    Past Medical History   Diagnosis  Date   .  Nephrolithiasis    .  Coronary artery disease      a. ETT 9/13: + ischemic ECG changes; b. ETT-MV 9/13: EF 62%; + ischemic EKG changes and + inf ischemia; c. LHC 9/13: pLAD 30%, mLAD 70-80%, oDx 40-50% ==> FFR hemodynamically  significant stenosis in mLAD ==> PCI: Xience Xpedition DES to mLAD   .  GERD (gastroesophageal reflux disease)    .  Hyperlipidemia LDL goal < 70     Past Surgical History   Procedure  Date   .  Hernia repair      inguinal   .  Knee surgery      right knee-arthroscopic procedure   .  Cardiac catheterization    .  Coronary angioplasty with stent placement  04/11/2012     DES LAD   .  Cataract extraction     Current Outpatient Prescriptions   Medication  Sig  Dispense  Refill   .  aspirin EC 81 MG tablet  Take 81 mg by mouth daily.     Marland Kitchen  atorvastatin (LIPITOR) 40 MG tablet  Take 40 mg by mouth daily.     .  clopidogrel (PLAVIX) 75 MG tablet  Take 75 mg by mouth daily.     .  Melatonin 3 MG TABS  Take 3 mg by mouth at bedtime.     .  nitroGLYCERIN (NITROSTAT) 0.4 MG SL tablet  Place 0.4 mg under the tongue every 5 (five) minutes as needed. For chest pain      No  Known Allergies  History    Social History   .  Marital Status:  Married     Spouse Name:  N/A     Number of Children:  2   .  Years of Education:  N/A    Occupational History   .  Retired     Social History Main Topics   .  Smoking status:  Never Smoker   .  Smokeless tobacco:  Never Used   .  Alcohol Use:  No   .  Drug Use:  No   .  Sexually Active:  Not on file    Other Topics  Concern   .  Not on file    Social History Narrative   .  No narrative on file    Family History   Problem  Relation  Age of Onset   .  Diabetes  Mother    .  Heart attack  Mother  69   .  Diabetes  Father    .  Heart attack  Father  44   .  Diabetes  Sister    .  Arthritis  Brother     Review of Systems: As stated in the HPI and otherwise negative.  BP 138/88  Pulse 55  Ht 6' (1.829 m)  Wt 192 lb 6.4 oz (87.272 kg)  BMI 26.09 kg/m2  Physical Examination:  General: Well developed, well nourished, NAD  HEENT: OP clear, mucus membranes moist  SKIN: warm, dry. No rashes.  Neuro: No focal deficits  Musculoskeletal:  Muscle strength 5/5 all ext  Psychiatric: Mood and affect normal  Neck: No JVD, no carotid bruits, no thyromegaly, no lymphadenopathy.  Lungs:Clear bilaterally, no wheezes, rhonci, crackles  Cardiovascular: Huston Foley, Regular rhythm. No murmurs, gallops or rubs.  Abdomen:Soft. Bowel sounds present. Non-tender.  Extremities: No lower extremity edema. Pulses are 2 + in the bilateral DP/PT.  EKG: Sinus brady, rate 56 bpm. No ischemic changes  Cardiac Cath 04/06/12:  Left main: No obstructive disease noted.  Left Anterior Descending Artery: Moderate caliber vessel that courses to the apex. The proximal vessel has a 30% stenosis. The mid vessel has a complex stenosis at the trifurcation of a moderate sized diagonal branch and a small caliber septal perforating branch. The LAD has 70-80% stenosis in the mid segment beginning just before the trifurcation and extending just beyond the trifurcation. Several views suggest that this stenosis may be flow limiting. The distal LAD has no obstructive disease. The diagonal branch has a 40-50% ostial stenosis.  Circumflex Artery: Large caliber, co-dominant vessel with no obstructive disease noted. There are three small caliber obtuse marginal branches that have no obstructive disease noted.  Right Coronary Artery: Small caliber, co-dominant vessel with mild plaque in the proximal and mid segments.  Left Ventricular Angiogram: LVEF=65%  PCI: 04/11/12: 2.75 x 23 mm Xience Xpedition dES mid LAD, PTCA diagonal  Assessment and Plan:  1. CAD: Pt presented today with chest pain. Now 6 weeks post DES in mid LAD. Will admit to telemery unit today and plan cath later this afternoon. Will check BMET, CBC and INR pre-cath. If stent ok, will plan on starting Imdur. He is on ASA, Plavix, statin. No beta blocker with bradycardia.

## 2012-06-01 NOTE — Progress Notes (Signed)
 History of Present Illness:66 yo male with history of CAD, kidney stones, possible GERD with strong family history of CAD who was seen in September 2013 for evaluation of chest pain. Exercise stress test arranged by Dr. Swords on 03/19/12 with no chest pain but ST depression concerning for ischemia. Stress myoview was ordered on 04/02/12. He had good exercise tolerance, no chest pain but ST depression at peak exercise and inferior wall defect concerning for ischemia. Cardiac cath on 04/06/12 with moderately severe mid LAD lesion involving the diagonal branch. He was brought back for PCI on 04/11/12 and FFR of the LAD was 0.77. A Xience Expedition (2.75 mm x 23 mm) drug eluting stent was placed in the mid LAD and angioplasty was performed on the ostium of the diagonal. He had good response to Plavix on PRU testing. He was seen in f/u by Scott Weaver, PA-C post cath on 04/25/12 and was doing well.  He is added on today for evaluation of chest pain. He tells me that he has been feeling a dull chest pain for last five days. He has been in cardiac rehab and had no pain during exercise. His HR has been elevated at cardiac rehab. Over the last few days he has had heaviness in his chest with radiation into his left arm. He is feeling poorly today. He is having heaviness in his chest and his left arm feels numb. No palpitations, SOB or dizziness.  Primary Care Physician: Bruce Swords  Last Lipid Profile:Lipid Panel    Component  Value  Date/Time    CHOL  200  05/30/2011 0946    TRIG  153.0*  05/30/2011 0946    HDL  46.10  05/30/2011 0946    CHOLHDL  4  05/30/2011 0946    VLDL  30.6  05/30/2011 0946    LDLCALC  123*  05/30/2011 0946    Past Medical History   Diagnosis  Date   .  Nephrolithiasis    .  Coronary artery disease      a. ETT 9/13: + ischemic ECG changes; b. ETT-MV 9/13: EF 62%; + ischemic EKG changes and + inf ischemia; c. LHC 9/13: pLAD 30%, mLAD 70-80%, oDx 40-50% ==> FFR hemodynamically  significant stenosis in mLAD ==> PCI: Xience Xpedition DES to mLAD   .  GERD (gastroesophageal reflux disease)    .  Hyperlipidemia LDL goal < 70     Past Surgical History   Procedure  Date   .  Hernia repair      inguinal   .  Knee surgery      right knee-arthroscopic procedure   .  Cardiac catheterization    .  Coronary angioplasty with stent placement  04/11/2012     DES LAD   .  Cataract extraction     Current Outpatient Prescriptions   Medication  Sig  Dispense  Refill   .  aspirin EC 81 MG tablet  Take 81 mg by mouth daily.     .  atorvastatin (LIPITOR) 40 MG tablet  Take 40 mg by mouth daily.     .  clopidogrel (PLAVIX) 75 MG tablet  Take 75 mg by mouth daily.     .  Melatonin 3 MG TABS  Take 3 mg by mouth at bedtime.     .  nitroGLYCERIN (NITROSTAT) 0.4 MG SL tablet  Place 0.4 mg under the tongue every 5 (five) minutes as needed. For chest pain      No   Known Allergies  History    Social History   .  Marital Status:  Married     Spouse Name:  N/A     Number of Children:  2   .  Years of Education:  N/A    Occupational History   .  Retired     Social History Main Topics   .  Smoking status:  Never Smoker   .  Smokeless tobacco:  Never Used   .  Alcohol Use:  No   .  Drug Use:  No   .  Sexually Active:  Not on file    Other Topics  Concern   .  Not on file    Social History Narrative   .  No narrative on file    Family History   Problem  Relation  Age of Onset   .  Diabetes  Mother    .  Heart attack  Mother  70   .  Diabetes  Father    .  Heart attack  Father  56   .  Diabetes  Sister    .  Arthritis  Brother     Review of Systems: As stated in the HPI and otherwise negative.  BP 138/88  Pulse 55  Ht 6' (1.829 m)  Wt 192 lb 6.4 oz (87.272 kg)  BMI 26.09 kg/m2  Physical Examination:  General: Well developed, well nourished, NAD  HEENT: OP clear, mucus membranes moist  SKIN: warm, dry. No rashes.  Neuro: No focal deficits  Musculoskeletal:  Muscle strength 5/5 all ext  Psychiatric: Mood and affect normal  Neck: No JVD, no carotid bruits, no thyromegaly, no lymphadenopathy.  Lungs:Clear bilaterally, no wheezes, rhonci, crackles  Cardiovascular: Brady, Regular rhythm. No murmurs, gallops or rubs.  Abdomen:Soft. Bowel sounds present. Non-tender.  Extremities: No lower extremity edema. Pulses are 2 + in the bilateral DP/PT.  EKG: Sinus brady, rate 56 bpm. No ischemic changes  Cardiac Cath 04/06/12:  Left main: No obstructive disease noted.  Left Anterior Descending Artery: Moderate caliber vessel that courses to the apex. The proximal vessel has a 30% stenosis. The mid vessel has a complex stenosis at the trifurcation of a moderate sized diagonal branch and a small caliber septal perforating branch. The LAD has 70-80% stenosis in the mid segment beginning just before the trifurcation and extending just beyond the trifurcation. Several views suggest that this stenosis may be flow limiting. The distal LAD has no obstructive disease. The diagonal branch has a 40-50% ostial stenosis.  Circumflex Artery: Large caliber, co-dominant vessel with no obstructive disease noted. There are three small caliber obtuse marginal branches that have no obstructive disease noted.  Right Coronary Artery: Small caliber, co-dominant vessel with mild plaque in the proximal and mid segments.  Left Ventricular Angiogram: LVEF=65%  PCI: 04/11/12: 2.75 x 23 mm Xience Xpedition dES mid LAD, PTCA diagonal  Assessment and Plan:  1. CAD: Pt presented today with chest pain. Now 6 weeks post DES in mid LAD. Will admit to telemery unit today and plan cath later this afternoon. Will check BMET, CBC and INR pre-cath. If stent ok, will plan on starting Imdur. He is on ASA, Plavix, statin. No beta blocker with bradycardia.   

## 2012-06-01 NOTE — Interval H&P Note (Signed)
History and Physical Interval Note:  06/01/2012 3:14 PM  Richard Mahoney  has presented today for cardiac cath with the diagnosis of Chest pain, recent DES LAD.  The various methods of treatment have been discussed with the patient and family. After consideration of risks, benefits and other options for treatment, the patient has consented to  Procedure(s) (LRB) with comments: LEFT HEART CATHETERIZATION WITH CORONARY ANGIOGRAM (N/A) as a surgical intervention .  The patient's history has been reviewed, patient examined, no change in status, stable for surgery.  I have reviewed the patient's chart and labs.  Questions were answered to the patient's satisfaction.     Gifford Ballon

## 2012-06-01 NOTE — CV Procedure (Signed)
    Cardiac Catheterization Operative Report  TAVIUS TURGEON [id number] 11/22/20133:25 PM [name], MD  Procedure Performed:  1. Left Heart Catheterization 2. Selective Coronary Angiography 3. Left ventricular angiogram  Operator: Verne Carrow, MD  Arterial access site:  Right radial artery.   Indication: 66 yo male with history of CAD with recent DES mid LAD 6 weeks ago with balloon angioplasty of diagonal branch. Now with chest pain concerning for unstable angina.                                       Procedure Details: The risks, benefits, complications, treatment options, and expected outcomes were discussed with the patient. The patient and/or family concurred with the proposed plan, giving informed consent. The patient was brought to the cath lab after IV hydration was begun and oral premedication was given. The patient was further sedated with Versed. The right wrist was assessed with an Allens test which was positive. The right wrist was prepped and draped in a sterile fashion. 1% lidocaine was used for local anesthesia. Using the modified Seldinger access technique, a 5 French sheath was placed in the right radial artery. 3 mg Verapamil was given through the sheath. 4500 units IV heparin was given. Standard diagnostic catheters were used to perform selective coronary angiography. A pigtail catheter was used to perform a left ventricular angiogram. The sheath was removed from the right radial artery and a Terumo hemostasis band was applied at the arteriotomy site on the right wrist.    There were no immediate complications. The patient was taken to the recovery area in stable condition.   Hemodynamic Findings: Central aortic pressure: 111/59 Left ventricular pressure: 106/5/9  Angiographic Findings:  Left main: No obstructive disease noted.   Left Anterior Descending Artery: Moderate caliber vessel that courses to the apex. The proximal vessel has a 30%  stenosis. The mid vessel has a patent stented segment with no evidence of restenosis. The distal LAD has no obstructive disease. The diagonal branch is covered by the LAD stent and has a 10% ostial stenosis. There is excellent flow down the diagonal branch.   Intermediate Artery: Small caliber vessel with 30% stenosis.   Circumflex Artery: Large caliber, co-dominant vessel with no obstructive disease noted. There are three small caliber obtuse marginal branches that have no obstructive disease noted.   Right Coronary Artery: Small caliber, co-dominant vessel with mild plaque in the proximal and mid segments.   Left Ventricular Angiogram: LVEF=60-65%  Impression: 1. Single vessel CAD with patent stent mid LAD, excellent flow down diagonal branch 2. Normal LV systolic function 3. Most likely non-cardiac chest pain. This may be related to GERD or microvascular angina.   Recommendations: Will treat GERD by restarting PPI. Since I am restarting PPI, will d/c Plavix and will start Effient 10 mg po Qdaily. Will load with Effient 60 mg po x 1 today. Continue ASA and statin. Will start Imdur for possible small vessel disease. No beta blocker secondary to bradycardia. Will monitor overnight. D/C in am if stable.        Complications:  None. The patient tolerated the procedure well.

## 2012-06-01 NOTE — Progress Notes (Signed)
TR BAND REMOVAL  LOCATION:  right radial  DEFLATED PER PROTOCOL:  yes  TIME BAND OFF / DRESSING APPLIED: 1745     SITE UPON ARRIVAL:   Level 0  SITE AFTER BAND REMOVAL:  Level 0  REVERSE ALLEN'S TEST:    positive  CIRCULATION SENSATION AND MOVEMENT:  Within Normal Limits  yes  COMMENTS:    

## 2012-06-02 ENCOUNTER — Encounter (HOSPITAL_COMMUNITY): Payer: Self-pay | Admitting: Cardiology

## 2012-06-02 LAB — BASIC METABOLIC PANEL
GFR calc Af Amer: 77 mL/min — ABNORMAL LOW (ref >90)
GFR calc non Af Amer: 66 mL/min — ABNORMAL LOW (ref >90)
Potassium: 4.2 mEq/L (ref 3.5–5.1)
Sodium: 140 mEq/L (ref 135–145)

## 2012-06-02 LAB — CBC
Platelets: 195 10*3/uL (ref 150–400)
RBC: 3.97 MIL/uL — ABNORMAL LOW (ref 4.22–5.81)
RDW: 12.3 % (ref 11.5–15.5)
WBC: 4.4 10*3/uL (ref 4.0–10.5)

## 2012-06-02 LAB — LDL CHOLESTEROL, DIRECT: Direct LDL: 52 mg/dL

## 2012-06-02 MED ORDER — PRASUGREL HCL 10 MG PO TABS: 10.0000 mg | ORAL_TABLET | Freq: Every day | ORAL | Status: DC

## 2012-06-02 MED ORDER — PANTOPRAZOLE SODIUM 40 MG PO TBEC: 40.0000 mg | DELAYED_RELEASE_TABLET | Freq: Every day | ORAL | Status: DC

## 2012-06-02 MED ORDER — ISOSORBIDE MONONITRATE ER 30 MG PO TB24: 30.0000 mg | ORAL_TABLET | Freq: Every day | ORAL | Status: DC

## 2012-06-02 MED ORDER — ASPIRIN EC 81 MG PO TBEC
81.0000 mg | DELAYED_RELEASE_TABLET | Freq: Every day | ORAL | Status: DC
  Administered 2012-06-02: 10:00:00 81 mg via ORAL
  Filled 2012-06-02: qty 1

## 2012-06-02 NOTE — Progress Notes (Addendum)
Cardiology Progress Note Patient Name: Richard Mahoney Date of Encounter: 06/02/2012, 8:02 AM     Subjective  No overnight events. He feels great this morning stating "I finally don't have a heavy weight on my chest". Has ambulated without chest pain or sob. BP in the 80-90s throughout the night. Patient asymptomatic without dizziness.   Objective   Telemetry: Sinus rhythm 50-60s  Medications:  . aspirin EC  81 mg Oral Daily  . atorvastatin  40 mg Oral q1800  . [COMPLETED] fentaNYL      . [COMPLETED] heparin      . [COMPLETED] heparin      . isosorbide mononitrate  30 mg Oral Daily  . [COMPLETED] lidocaine      . [COMPLETED] midazolam      . [COMPLETED] nitroGLYCERIN      . pantoprazole  40 mg Oral Daily  . prasugrel  10 mg Oral Daily  . [COMPLETED] prasugrel  60 mg Oral Once  . [COMPLETED] verapamil      . [DISCONTINUED] aspirin  324 mg Oral Pre-Cath  . [DISCONTINUED] aspirin EC  81 mg Oral Daily  . [DISCONTINUED] aspirin EC  81 mg Oral Daily  . [DISCONTINUED] clopidogrel  75 mg Oral Q breakfast  . [DISCONTINUED] diazepam  5 mg Oral On Call  . [DISCONTINUED] sodium chloride  3 mL Intravenous Q12H    Physical Exam: Temp:  [97.6 F (36.4 C)-98 F (36.7 C)] 97.6 F (36.4 C) (11/23 0700) Pulse Rate:  [55-76] 58  (11/23 0700) Resp:  [12-18] 12  (11/23 0700) BP: (88-138)/(42-117) 88/42 mmHg (11/23 0700) SpO2:  [94 %-98 %] 94 % (11/23 0700) Weight:  [192 lb 6.4 oz (87.272 kg)-194 lb 14.2 oz (88.4 kg)] 194 lb 14.2 oz (88.4 kg) (11/23 0615)  General: Pleasant white male, in no acute distress. Head: Normocephalic, atraumatic, sclera non-icteric, nares are without discharge.  Neck: Supple. Negative for carotid bruits or JVD Lungs: Clear bilaterally to auscultation without wheezes, rales, or rhonchi. Breathing is unlabored. Heart: RRR S1 S2 without murmurs, rubs, or gallops.  Abdomen: Soft, non-tender, non-distended with normoactive bowel sounds. No rebound/guarding. No  obvious abdominal masses. Msk:  Strength and tone appear normal for age. Extremities: Right wrist without hematoma. No edema. No clubbing or cyanosis. Distal pedal pulses are intact and equal bilaterally. Neuro: Alert and oriented X 3. Moves all extremities spontaneously. Psych:  Responds to questions appropriately with a normal affect.   Intake/Output Summary (Last 24 hours) at 06/02/12 0802 Last data filed at 06/02/12 0215  Gross per 24 hour  Intake    705 ml  Output    601 ml  Net    104 ml    Labs:  [facility] 06/02/12 0525 06/01/12 1329  NA 140 139  K 4.2 3.9  CL 105 104  CO2 28 27  GLUCOSE 103* 92  BUN 23 26*  CREATININE 1.11 1.08  CALCIUM 8.7 8.9  MG -- 1.9   Basename 06/01/12 1329  AST 24  ALT 25  ALKPHOS 53  BILITOT 0.6  PROT 6.5  ALBUMIN 3.7   Basename 06/02/12 0525 06/01/12 1329  WBC 4.4 3.3*  NEUTROABS -- 1.5*  HGB 12.3* 12.5*  HCT 35.9* 36.3*  MCV 90.4 90.5  PLT 195 202   Radiology/Studies:   06/01/12 - Cardiac Cath Hemodynamic Findings:  Central aortic pressure: 111/59  Left ventricular pressure: 106/5/9  Angiographic Findings:  Left main: No obstructive disease noted.  Left Anterior Descending  Artery: Moderate caliber vessel that courses to the apex. The proximal vessel has a 30% stenosis. The mid vessel has a patent stented segment with no evidence of restenosis. The distal LAD has no obstructive disease. The diagonal branch is covered by the LAD stent and has a 10% ostial stenosis. There is excellent flow down the diagonal branch.  Intermediate Artery: Small caliber vessel with 30% stenosis.  Circumflex Artery: Large caliber, co-dominant vessel with no obstructive disease noted. There are three small caliber obtuse marginal branches that have no obstructive disease noted.  Right Coronary Artery: Small caliber, co-dominant vessel with mild plaque in the proximal and mid segments.  Left Ventricular Angiogram: LVEF=60-65%  Impression:  1. Single  vessel CAD with patent stent mid LAD, excellent flow down diagonal branch  2. Normal LV systolic function  3. Most likely non-cardiac chest pain. This may be related to GERD or microvascular angina.  Recommendations: Will treat GERD by restarting PPI. Since I am restarting PPI, will d/c Plavix and will start Effient 10 mg po Qdaily. Will load with Effient 60 mg po x 1 today. Continue ASA and statin. Will start Imdur for possible small vessel disease. No beta blocker secondary to bradycardia. Will monitor overnight. D/C in am if stable.  Complications: None. The patient tolerated the procedure well.     Assessment and Plan   1. Midsternal Chest Pain: Cardiac cath showed single vessel CAD w/ patent mid LAD stent and normal LV systolic function. Most likely non-cardiac chest pain. This may be related to GERD or microvascular angina. Initiated on PPI and Imdur. Plavix changed to Effient due to need for PPI. Cont ASA and statin. No BB 2/2 bradycardia. Cath site stable. Crt ok. EKG stable. Consider decreasing Imdur dose if BP continues to run in the 80-90s.  2. Coronary Artery Disease: As above  3. GERD: As above  4. Hyperlipidemia: Cont statin  Disposition: Likely home today pending MD evaluation.     Signed, HOPE, JESSICA PA-C  Check lipids this am prior to discharge as last LDL 11/12 >120 Continue meds

## 2012-06-02 NOTE — Discharge Summary (Addendum)
Discharge Summary   Patient ID: Richard Mahoney MRN: [id number], DOB/AGE: 68-29-1947 68 y.o.  Primary MD: [name], MD Primary Cardiologist: Dr. [name] Admit date: 06/01/2012 D/C date:     06/02/2012      Primary Discharge Diagnoses:  1. Midsternal Chest Pain  - Cath demonstrated single vessel CAD w/ patent mid LAD stent and normal LV systolic function  - ? R/t GERD or microvascular angina  - Initiated on Imdur and PPI, Plavix changed to Effient  2. Hyperlipidemia  - Direct LDL 52  3. GERD   Secondary Discharge Diagnoses:  . Nephrolithiasis   . Coronary artery disease     a. ETT 9/13: + ischemic ECG changes;  b. ETT-MV 9/13: EF 62%;  + ischemic EKG changes and + inf ischemia;  c. LHC 9/13: pLAD 30%, mLAD 70-80%, oDx 40-50% ==> FFR hemodynamically significant stenosis in mLAD ==> PCI: Xience Xpedition DES to mLAD; Cath 06/01/12   single vessel CAD w/ patent mid LAD stent and normal LV systolic function  . Fibromyalgia     Allergies No Known Allergies  Diagnostic Studies/Procedures:  06/01/12 - Cardiac Cath  Hemodynamic Findings:  Central aortic pressure: 111/59  Left ventricular pressure: 106/5/9  Angiographic Findings:  Left main: No obstructive disease noted.  Left Anterior Descending Artery: Moderate caliber vessel that courses to the apex. The proximal vessel has a 30% stenosis. The mid vessel has a patent stented segment with no evidence of restenosis. The distal LAD has no obstructive disease. The diagonal branch is covered by the LAD stent and has a 10% ostial stenosis. There is excellent flow down the diagonal branch.  Intermediate Artery: Small caliber vessel with 30% stenosis.  Circumflex Artery: Large caliber, co-dominant vessel with no obstructive disease noted. There are three small caliber obtuse marginal branches that have no obstructive disease noted.  Right Coronary Artery: Small caliber, co-dominant vessel with mild plaque in the proximal  and mid segments.  Left Ventricular Angiogram: LVEF=60-65%  Impression:  1. Single vessel CAD with patent stent mid LAD, excellent flow down diagonal branch  2. Normal LV systolic function  3. Most likely non-cardiac chest pain. This may be related to GERD or microvascular angina.  Recommendations: Will treat GERD by restarting PPI. Since I am restarting PPI, will d/c Plavix and will start Effient 10 mg po Qdaily. Will load with Effient 60 mg po x 1 today. Continue ASA and statin. Will start Imdur for possible small vessel disease. No beta blocker secondary to bradycardia. Will monitor overnight. D/C in am if stable.  Complications: None. The patient tolerated the procedure well.    History of Present Illness: 66 y.o. male w/ the above medical problems who was evaluated in clinic for complaints of chest pain for which he was referred for cardiac catheterization.   Hospital Course: He presented to [facility] on 06/01/12 in stable condition. Cardiac cath demonstrated single vessel CAD w/ patent mid LAD stent and normal LV systolic function. He tolerated the procedure well without complications. It was felt his pain was likely non-cardiac in origin and may be related to GERD or microvascular angina. He was placed on a PPI as well as Imdur. Plavix was changed to Effient due to need for PPI. He was provided a 30day free Effient card. He was able to ambulate without chest pain or sob. Cath site remained stable. He was seen and evaluated by Dr. Graciela Husbands who felt he was stable for discharge home with plans for follow  up as scheduled below. .  Discharge Vitals: Blood pressure 88/42, pulse 58, temperature 97.6 F (36.4 C), temperature source Oral, resp. rate 12, height 6' (1.829 m), weight 194 lb 14.2 oz (88.4 kg), SpO2 94.00%.  Labs: Component Value Date   WBC 4.4 06/02/2012   HGB 12.3* 06/02/2012   HCT 35.9* 06/02/2012   MCV 90.4 06/02/2012   PLT 195 06/02/2012    Lab 06/02/12 0525 06/01/12  1329  NA 140 --  K 4.2 --  CL 105 --  CO2 28 --  BUN 23 --  CREATININE 1.11 --  CALCIUM 8.7 --  PROT -- 6.5  BILITOT -- 0.6  ALKPHOS -- 53  ALT -- 25  AST -- 24  GLUCOSE 103* --     06/02/2012 10:50  Direct LDL 52    Discharge Medications     Medication List     As of 06/02/2012 12:17 PM    STOP taking these medications         clopidogrel 75 MG tablet   Commonly known as: PLAVIX      TAKE these medications         aspirin EC 81 MG tablet   Take 81 mg by mouth at bedtime.      isosorbide mononitrate 30 MG 24 hr tablet   Commonly known as: IMDUR   Take 1 tablet (30 mg total) by mouth daily.      Melatonin 3 MG Tabs   Take 3 mg by mouth at bedtime.      nitroGLYCERIN 0.4 MG SL tablet   Commonly known as: NITROSTAT   Place 0.4 mg under the tongue every 5 (five) minutes as needed. For chest pain      pantoprazole 40 MG tablet   Commonly known as: PROTONIX   Take 1 tablet (40 mg total) by mouth daily.      prasugrel 10 MG Tabs   Commonly known as: EFFIENT   Take 1 tablet (10 mg total) by mouth daily.      ASK your doctor about these medications         atorvastatin 40 MG tablet   Commonly known as: LIPITOR   Take 40 mg by mouth at bedtime.          Disposition   Discharge Orders    Future Appointments: Provider: Department: Dept Phone: Center:   07/10/2012 9:15 AM [facility] Main Office [facility]) [phone] [facility]   07/12/2012 10:30 AM Kathleene Hazel, MD [place] [facility] Main Office [place]) [phone] [facility]     Future Orders Please Complete By Expires   Diet - low sodium heart healthy      Increase activity slowly      Discharge instructions      Comments:   * KEEP WRIST CATHETERIZATION SITE CLEAN AND DRY. Call the office for any signs of bleeding, pus, swelling, increased pain, or any other concerns. * NO HEAVY LIFTING (>10lbs) OR SEXUAL ACTIVITY X 7 DAYS. * NO DRIVING X 2-3 DAYS. * NO  SOAKING BATHS, HOT TUBS, POOLS, ETC., X 7 DAYS.     Follow-up Information    Follow up with Verne Carrow, MD. (Our office will call you with your appointment time)    Contact information:   [facility] [address] [place] [address] [phone]           Outstanding Labs/Studies:  None  Duration of Discharge Encounter: Greater than 30 minutes including physician and PA  time.  Signed, Sharetta Ricchio PA-C 06/02/2012, 12:17 PM

## 2012-06-14 ENCOUNTER — Encounter: Payer: Self-pay | Admitting: Cardiovascular Disease

## 2012-06-14 ENCOUNTER — Ambulatory Visit (INDEPENDENT_AMBULATORY_CARE_PROVIDER_SITE_OTHER): Payer: PRIVATE HEALTH INSURANCE | Admitting: Cardiovascular Disease

## 2012-06-14 VITALS — BP 119/73 | HR 60 | Ht 72.0 in | Wt 193.0 lb

## 2012-06-14 DIAGNOSIS — I2510 Atherosclerotic heart disease of native coronary artery without angina pectoris: Secondary | ICD-10-CM

## 2012-06-14 NOTE — Patient Instructions (Addendum)
Your physician wants you to follow-up in:  6 months. You will receive a reminder letter in the mail two months in advance. If you don't receive a letter, please call our office to schedule the follow-up appointment.   

## 2012-06-14 NOTE — Progress Notes (Signed)
History of Present Illness: 66 yo male with history of CAD, kidney stones, possible GERD with strong family history of CAD who was seen in September 2013 for evaluation of chest pain. Exercise stress test arranged by Dr. Cato Mulligan on 03/19/12 with no chest pain but ST depression concerning for ischemia. Stress myoview was ordered on 04/02/12. He had good exercise tolerance, no chest pain but ST depression at peak exercise and inferior wall defect concerning for ischemia. Cardiac cath on 04/06/12 with moderately severe mid LAD lesion involving the diagonal branch. He was brought back for PCI on 04/11/12 and FFR of the LAD was 0.77. A Xience Expedition (2.75 mm x 23 mm) drug eluting stent was placed in the mid LAD and angioplasty was performed on the ostium of the diagonal. He had good response to Plavix on PRU testing. He was seen in f/u by Tereso Newcomer, PA-C post cath on 04/25/12 and was doing well.  He is added on today for evaluation of chest pain on11/22/13 and c/o chest pain with radiation into his left arm with ongoing fatigue. I arranged a repeat cardiac cath later that day on 06/01/12 and his CAD was stable. (see below).  He is here today for follow up. He is feeling great. No chest pain or SOB.   Primary Care Physician: Birdie Sons  Last Lipid Profile:Lipid Panel     Component Value Date/Time   CHOL 200 05/30/2011 0946   TRIG 153.0* 05/30/2011 0946   HDL 46.10 05/30/2011 0946   CHOLHDL 4 05/30/2011 0946   VLDL 30.6 05/30/2011 0946   LDLCALC 123* 05/30/2011 0946     Past Medical History  Diagnosis Date  . Nephrolithiasis   . Coronary artery disease     a. ETT 9/13: + ischemic ECG changes;  b. ETT-MV 9/13: EF 62%;  + ischemic EKG changes and + inf ischemia;  c. LHC 9/13: pLAD 30%, mLAD 70-80%, oDx 40-50% ==> FFR hemodynamically significant stenosis in mLAD ==> PCI: Xience Xpedition DES to mLAD; Cath 06/01/12   single vessel CAD w/ patent mid LAD stent and normal LV systolic function  .  GERD (gastroesophageal reflux disease)   . Hyperlipidemia LDL goal < 70   . Fibromyalgia     Past Surgical History  Procedure Date  . Inguinal hernia repair 1990's    right  . Knee arthroscopy 1990's    right  . Cataract extraction w/ intraocular lens implant 08/2011    right  . Cardiac catheterization 04/06/2012; 06/01/2012  . Coronary angioplasty with stent placement 04/11/2012    DES LAD  . Cystoscopy w/ stone manipulation 2009    Current Outpatient Prescriptions  Medication Sig Dispense Refill  . aspirin EC 81 MG tablet Take 81 mg by mouth at bedtime.      Marland Kitchen atorvastatin (LIPITOR) 40 MG tablet Take 40 mg by mouth at bedtime.      . isosorbide mononitrate (IMDUR) 30 MG 24 hr tablet Take 1 tablet (30 mg total) by mouth daily.  30 tablet  6  . Melatonin 3 MG TABS Take 3 mg by mouth at bedtime.       . nitroGLYCERIN (NITROSTAT) 0.4 MG SL tablet Place 0.4 mg under the tongue every 5 (five) minutes as needed. For chest pain      . pantoprazole (PROTONIX) 40 MG tablet Take 1 tablet (40 mg total) by mouth daily.  30 tablet  2  . prasugrel (EFFIENT) 10 MG TABS Take 1 tablet (10 mg total) by  mouth daily.  30 tablet  11    No Known Allergies  History   Social History  . Marital Status: Married    Spouse Name: N/A    Number of Children: 2  . Years of Education: N/A   Occupational History  . Retired    Social History Main Topics  . Smoking status: Never Smoker   . Smokeless tobacco: Never Used  . Alcohol Use: No  . Drug Use: No  . Sexually Active: Not Currently   Other Topics Concern  . Not on file   Social History Narrative  . No narrative on file    Family History  Problem Relation Age of Onset  . Diabetes Mother   . Heart attack Mother 47  . Diabetes Father   . Heart attack Father 80  . Diabetes Sister   . Arthritis Brother     Review of Systems:  As stated in the HPI and otherwise negative.   BP 119/73  Pulse 60  Ht 6' (1.829 m)  Wt 193 lb (87.544 kg)   BMI 26.18 kg/m2  Physical Examination: General: Well developed, well nourished, NAD HEENT: OP clear, mucus membranes moist SKIN: warm, dry. No rashes. Neuro: No focal deficits Musculoskeletal: Muscle strength 5/5 all ext Psychiatric: Mood and affect normal Neck: No JVD, no carotid bruits, no thyromegaly, no lymphadenopathy. Lungs:Clear bilaterally, no wheezes, rhonci, crackles Cardiovascular: Regular rate and rhythm. No murmurs, gallops or rubs. Abdomen:Soft. Bowel sounds present. Non-tender.  Extremities: No lower extremity edema. Pulses are 2 + in the bilateral DP/PT.  Cardiac cath 06/01/12:  Left main: No obstructive disease noted.  Left Anterior Descending Artery: Moderate caliber vessel that courses to the apex. The proximal vessel has a 30% stenosis. The mid vessel has a patent stented segment with no evidence of restenosis. The distal LAD has no obstructive disease. The diagonal branch is covered by the LAD stent and has a 10% ostial stenosis. There is excellent flow down the diagonal branch.  Intermediate Artery: Small caliber vessel with 30% stenosis.  Circumflex Artery: Large caliber, co-dominant vessel with no obstructive disease noted. There are three small caliber obtuse marginal branches that have no obstructive disease noted.  Right Coronary Artery: Small caliber, co-dominant vessel with mild plaque in the proximal and mid segments.  Left Ventricular Angiogram: LVEF=60-65%   Assessment and Plan:   1. CAD: Coronaries stable by cath 06/01/12.  He is on ASA, statin and Imdur. No beta blocker with bradycardia. Plavix changed to Effient since PPI started. Feeling great. Continue with cardiac rehab. Xanax prn anxiety.  2.GERD: Chest pain resolved with PPI.

## 2012-06-20 ENCOUNTER — Encounter: Payer: Self-pay | Admitting: Cardiovascular Disease

## 2012-07-10 ENCOUNTER — Other Ambulatory Visit: Payer: Medicare Other

## 2012-07-11 ENCOUNTER — Encounter: Payer: Self-pay | Admitting: Physician Assistant

## 2012-07-12 ENCOUNTER — Ambulatory Visit: Payer: Medicare Other | Admitting: Cardiovascular Disease

## 2012-07-18 ENCOUNTER — Encounter: Payer: Self-pay | Admitting: Physician Assistant

## 2012-07-18 ENCOUNTER — Encounter: Payer: Self-pay | Admitting: Cardiovascular Disease

## 2012-07-27 ENCOUNTER — Other Ambulatory Visit: Payer: Self-pay | Admitting: *Deleted

## 2012-07-27 DIAGNOSIS — I2510 Atherosclerotic heart disease of native coronary artery without angina pectoris: Secondary | ICD-10-CM

## 2012-07-27 MED ORDER — ISOSORBIDE MONONITRATE ER 30 MG PO TB24: 15.0000 mg | ORAL_TABLET | Freq: Every day | ORAL | Status: DC

## 2012-08-27 ENCOUNTER — Other Ambulatory Visit (HOSPITAL_COMMUNITY): Payer: Self-pay | Admitting: Cardiology

## 2012-08-31 ENCOUNTER — Other Ambulatory Visit (HOSPITAL_COMMUNITY): Payer: Self-pay | Admitting: Internal Medicine

## 2012-08-31 ENCOUNTER — Other Ambulatory Visit (HOSPITAL_COMMUNITY): Payer: Self-pay | Admitting: Cardiology

## 2012-09-09 ENCOUNTER — Encounter: Payer: Self-pay | Admitting: Cardiovascular Disease

## 2012-09-30 ENCOUNTER — Encounter: Payer: Self-pay | Admitting: Internal Medicine

## 2012-10-23 ENCOUNTER — Other Ambulatory Visit: Payer: Self-pay | Admitting: Cardiovascular Disease

## 2012-10-24 ENCOUNTER — Other Ambulatory Visit (INDEPENDENT_AMBULATORY_CARE_PROVIDER_SITE_OTHER): Payer: Medicare Other

## 2012-10-24 ENCOUNTER — Other Ambulatory Visit: Payer: Self-pay | Admitting: Cardiovascular Disease

## 2012-10-24 DIAGNOSIS — E785 Hyperlipidemia, unspecified: Secondary | ICD-10-CM

## 2012-10-24 DIAGNOSIS — N419 Inflammatory disease of prostate, unspecified: Secondary | ICD-10-CM | POA: Diagnosis not present

## 2012-10-24 DIAGNOSIS — Z0000 Encounter for general adult medical examination without abnormal findings: Secondary | ICD-10-CM

## 2012-10-24 LAB — BASIC METABOLIC PANEL
BUN: 23 mg/dL (ref 6–23)
CO2: 24 mEq/L (ref 19–32)
Chloride: 104 mEq/L (ref 96–112)
Potassium: 4.6 mEq/L (ref 3.5–5.1)

## 2012-10-24 LAB — CBC WITH DIFFERENTIAL/PLATELET
Basophils Relative: 0.9 % (ref 0.0–3.0)
Eosinophils Absolute: 0.2 10*3/uL (ref 0.0–0.7)
Eosinophils Relative: 4.4 % (ref 0.0–5.0)
Hemoglobin: 14 g/dL (ref 13.0–17.0)
Lymphocytes Relative: 40.6 % (ref 12.0–46.0)
MCHC: 34.4 g/dL (ref 30.0–36.0)
Monocytes Relative: 10.1 % (ref 3.0–12.0)
Neutrophils Relative %: 44 % (ref 43.0–77.0)
RBC: 4.5 Mil/uL (ref 4.22–5.81)
WBC: 4.4 10*3/uL — ABNORMAL LOW (ref 4.5–10.5)

## 2012-10-24 LAB — POCT URINALYSIS DIPSTICK
Blood, UA: NEGATIVE
Spec Grav, UA: >=1.03
Urobilinogen, UA: 0.2

## 2012-10-24 LAB — HEPATIC FUNCTION PANEL
AST: 25 U/L (ref 0–37)
Albumin: 4 g/dL (ref 3.5–5.2)
Alkaline Phosphatase: 60 U/L (ref 39–117)
Total Protein: 6.7 g/dL (ref 6.0–8.3)

## 2012-10-24 LAB — LIPID PANEL
Cholesterol: 126 mg/dL (ref 0–200)
LDL Cholesterol: 73 mg/dL (ref 0–99)
Total CHOL/HDL Ratio: 3
Triglycerides: 55 mg/dL (ref 0.0–149.0)
VLDL: 11 mg/dL (ref 0.0–40.0)

## 2012-10-31 ENCOUNTER — Ambulatory Visit (INDEPENDENT_AMBULATORY_CARE_PROVIDER_SITE_OTHER): Payer: Medicare Other | Admitting: Internal Medicine

## 2012-10-31 ENCOUNTER — Encounter: Payer: PRIVATE HEALTH INSURANCE | Admitting: Internal Medicine

## 2012-10-31 ENCOUNTER — Encounter: Payer: Self-pay | Admitting: Internal Medicine

## 2012-10-31 VITALS — BP 118/74 | HR 84 | Temp 98.0°F | Ht 72.0 in | Wt 189.0 lb

## 2012-10-31 DIAGNOSIS — R972 Elevated prostate specific antigen [PSA]: Secondary | ICD-10-CM

## 2012-10-31 DIAGNOSIS — E785 Hyperlipidemia, unspecified: Secondary | ICD-10-CM | POA: Diagnosis not present

## 2012-10-31 DIAGNOSIS — N419 Inflammatory disease of prostate, unspecified: Secondary | ICD-10-CM | POA: Diagnosis not present

## 2012-10-31 DIAGNOSIS — I2510 Atherosclerotic heart disease of native coronary artery without angina pectoris: Secondary | ICD-10-CM | POA: Diagnosis not present

## 2012-10-31 MED ORDER — CIPROFLOXACIN HCL 500 MG PO TABS: 500.0000 mg | ORAL_TABLET | Freq: Two times a day (BID) | ORAL | Status: DC

## 2012-10-31 MED ORDER — NITROGLYCERIN 0.4 MG SL SUBL: 0.4000 mg | SUBLINGUAL_TABLET | SUBLINGUAL | Status: DC | PRN

## 2012-10-31 MED ORDER — ATORVASTATIN CALCIUM 40 MG PO TABS: 20.0000 mg | ORAL_TABLET | Freq: Every day | ORAL | Status: DC

## 2012-10-31 NOTE — Assessment & Plan Note (Signed)
Note minimally elevated psa cipro for 10 days  psa in 6 weeks

## 2012-10-31 NOTE — Assessment & Plan Note (Signed)
No sxs His sxs were likely GI related as his sxs completely resolved on PPI Nonetheless he has done well s/p stent.  Effient x one year He would like to minimize meds Ok to d/c imdur Cut lipitor in 1/2

## 2012-10-31 NOTE — Addendum Note (Signed)
Addended by: Lindley Magnus on: 10/31/2012 08:24 AM   Modules accepted: Orders

## 2012-10-31 NOTE — Progress Notes (Signed)
Patient ID: Richard Mahoney, male   DOB: 1945/12/26, 67 y.o.   MRN: [id number] Cad-- no sxs. Has had stent - on effient GERD-- great control on protonix Lipids-- on lipitor. He would like to discontinue  Past Medical History  Diagnosis Date  . Nephrolithiasis   . Coronary artery disease     a. ETT 9/13: + ischemic ECG changes;  b. ETT-MV 9/13: EF 62%;  + ischemic EKG changes and + inf ischemia;  c. LHC 9/13: pLAD 30%, mLAD 70-80%, oDx 40-50% ==> FFR hemodynamically significant stenosis in mLAD ==> PCI: Xience Xpedition DES to mLAD; Cath 06/01/12   single vessel CAD w/ patent mid LAD stent and normal LV systolic function  . GERD (gastroesophageal reflux disease)   . Hyperlipidemia LDL goal < 70   . Fibromyalgia     History   Social History  . Marital Status: Married    Spouse Name: N/A    Number of Children: 2  . Years of Education: N/A   Occupational History  . Retired    Social History Main Topics  . Smoking status: Never Smoker   . Smokeless tobacco: Never Used  . Alcohol Use: No  . Drug Use: No  . Sexually Active: Not Currently   Other Topics Concern  . Not on file   Social History Narrative  . No narrative on file    Past Surgical History  Procedure Laterality Date  . Inguinal hernia repair  1990's    right  . Knee arthroscopy  1990's    right  . Cataract extraction w/ intraocular lens implant  08/2011    right  . Cardiac catheterization  04/06/2012; 06/01/2012  . Coronary angioplasty with stent placement  04/11/2012    DES LAD  . Cystoscopy w/ stone manipulation  2009    Family History  Problem Relation Age of Onset  . Diabetes Mother   . Heart attack Mother 50  . Diabetes Father   . Heart attack Father 52  . Diabetes Sister   . Arthritis Brother     No Known Allergies  Current Outpatient Prescriptions on File Prior to Visit  Medication Sig Dispense Refill  . aspirin EC 81 MG tablet Take 81 mg by mouth at bedtime.      Marland Kitchen atorvastatin (LIPITOR) 40  MG tablet TAKE 1 TABLET AT BEDTIME  30 tablet  8  . isosorbide mononitrate (IMDUR) 30 MG 24 hr tablet Take 0.5 tablets (15 mg total) by mouth daily.  30 tablet  6  . Melatonin 3 MG TABS Take 3 mg by mouth at bedtime.       Marland Kitchen NITROSTAT 0.4 MG SL tablet DISSOLVE 1 TABLET IN MOUTH EVERY 5 MINS FOR 3 DOSES AS NEEDED FOR CHEST PAIN  25 tablet  6  . pantoprazole (PROTONIX) 40 MG tablet TAKE 1 TABLET EVERY DAY  30 tablet  2  . prasugrel (EFFIENT) 10 MG TABS Take 1 tablet (10 mg total) by mouth daily.  30 tablet  11   No current facility-administered medications on file prior to visit.     patient denies chest pain, shortness of breath, orthopnea. Denies lower extremity edema, abdominal pain, change in appetite, change in bowel movements. Patient denies rashes, musculoskeletal complaints. No other specific complaints in a complete review of systems.   BP 118/74  Pulse 84  Temp(Src) 98 F (36.7 C) (Oral)  Ht 6' (1.829 m)  Wt 189 lb (85.73 kg)  BMI 25.63 kg/m2   well-developed  well-nourished male in no acute distress. HEENT exam atraumatic, normocephalic, neck supple without jugular venous distention. Chest clear to auscultation cardiac exam S1-S2 are regular. Abdominal exam overweight with bowel sounds, soft and nontender. Extremities no edema. Neurologic exam is alert with a normal gait.

## 2012-10-31 NOTE — Assessment & Plan Note (Signed)
Controlled Will see if he can get by with 20 mg lipitor

## 2012-11-02 ENCOUNTER — Encounter: Payer: PRIVATE HEALTH INSURANCE | Admitting: Internal Medicine

## 2012-11-20 ENCOUNTER — Other Ambulatory Visit (HOSPITAL_COMMUNITY): Payer: Self-pay | Admitting: Internal Medicine

## 2012-11-28 ENCOUNTER — Telehealth: Payer: Self-pay | Admitting: Internal Medicine

## 2012-11-28 NOTE — Telephone Encounter (Signed)
Patient Information:  Caller Name: Sigurd  Phone: [phone]  Patient: Richard Mahoney, Richard [id number]  Gender: Male  DOB: 09-Mar-1946  Age: 67 Years  PCP: Birdie Sons (Adults only)  Office Follow Up:  Does the office need to follow up with this patient?: Yes  Instructions For The Office: Not wanting to come in for appointment. Requesting med for diarrhea be called into [facility] on [address] In [place]. Will call if still having sx on 11/30/12.   Symptoms  Reason For Call & Symptoms: Repeated episodes of diarrhea for past 3 days and last night having stomach cramps. No nausea or vomiting. Afebriile. Takes Protonix and other medications like normal. Finished course of Cipro 5 days ago. Last voided this morning. Appetite diminished. .  Reviewed Health History In EMR: Yes  Reviewed Medications In EMR: Yes  Reviewed Allergies In EMR: Yes  Reviewed Surgeries / Procedures: Yes  Date of Onset of Symptoms: 11/25/2012  Guideline(s) Used:  Diarrhea  Disposition Per Guideline:   Go to Office Now  Reason For Disposition Reached:   Age > 60 years and has had > 6 diarrhea stools in past 24 hours  Advice Given:  Reassurance:  In healthy adults, new-onset diarrhea is usually caused by a viral infection of the intestines, which you can treat at home. Diarrhea is the body's way of getting rid of the infection. Here are some tips on how to keep ahead of the fluid losses.  Here is some care advice that should help.  Fluids:  Drink more fluids, at least 8-10 glasses (8 oz or 240 ml) daily.  For example: sports drinks, diluted fruit juices, soft drinks.  Supplement this with saltine crackers or soups to make certain that you are getting sufficient fluid and salt to meet your body's needs.  Avoid caffeinated beverages (Reason: caffeine is mildly dehydrating).  Nutrition:  Ideal initial foods include boiled starches/cereals (e.g., potatoes, rice, noodles, wheat, oats) with a small amount of salt to taste.  Other acceptable foods include: bananas, yogurt, crackers, soup.  As your stools return to normal consistency, resume a normal diet.  Expected Course:  Viral diarrhea lasts 4-7 days. Always worse on days 1 and 2.  Call Back If:  Signs of dehydration occur (e.g., no urine for more than 12 hours, very dry mouth, lightheaded, etc.)  Diarrhea lasts over 7 days  You become worse.  Patient Refused Recommendation:  Patient Requests Prescription  Requesting medication for diarrhea. Will call for appointment on 11/30/12 if still having liquid stools.

## 2012-11-29 NOTE — Telephone Encounter (Signed)
Call and see how he is doing 

## 2012-11-30 ENCOUNTER — Other Ambulatory Visit: Payer: PRIVATE HEALTH INSURANCE

## 2012-11-30 DIAGNOSIS — R197 Diarrhea, unspecified: Secondary | ICD-10-CM | POA: Diagnosis not present

## 2012-11-30 MED ORDER — DIPHENOXYLATE-ATROPINE 2.5-0.025 MG PO TABS: 1.0000 | ORAL_TABLET | Freq: Three times a day (TID) | ORAL | Status: DC | PRN

## 2012-11-30 NOTE — Telephone Encounter (Signed)
Spoke with pt on 11/29/12 (late documentation).  Pt is still having diarrhea.  Was unable to get in touch with Dr Cato Mulligan so per Dr Artist Pais take immodium and follow a BRAT diet.  Come in and get collection materials for Cdiff. Dr Cato Mulligan called later and said to call in some lomotil tid prn for 3 days.  Pt came in this morning to collect vials for Cdiff and informed pt that [facility] [place].  Pt stated that after starting the immodium he only had one bout of diarrhea.  Pt verbalized understanding and had no questions.

## 2012-12-04 ENCOUNTER — Encounter: Payer: Self-pay | Admitting: Internal Medicine

## 2012-12-06 ENCOUNTER — Other Ambulatory Visit (INDEPENDENT_AMBULATORY_CARE_PROVIDER_SITE_OTHER): Payer: Medicare Other

## 2012-12-06 DIAGNOSIS — E785 Hyperlipidemia, unspecified: Secondary | ICD-10-CM | POA: Diagnosis not present

## 2012-12-06 DIAGNOSIS — N419 Inflammatory disease of prostate, unspecified: Secondary | ICD-10-CM | POA: Diagnosis not present

## 2012-12-06 LAB — LIPID PANEL
LDL Cholesterol: 60 mg/dL (ref 0–99)
Total CHOL/HDL Ratio: 3

## 2012-12-10 ENCOUNTER — Encounter: Payer: Self-pay | Admitting: Internal Medicine

## 2012-12-12 ENCOUNTER — Other Ambulatory Visit: Payer: PRIVATE HEALTH INSURANCE

## 2012-12-16 ENCOUNTER — Other Ambulatory Visit: Payer: Self-pay | Admitting: Internal Medicine

## 2012-12-16 MED ORDER — ATORVASTATIN CALCIUM 10 MG PO TABS: 10.0000 mg | ORAL_TABLET | Freq: Every day | ORAL | Status: DC

## 2012-12-20 ENCOUNTER — Ambulatory Visit (INDEPENDENT_AMBULATORY_CARE_PROVIDER_SITE_OTHER): Payer: Medicare Other | Admitting: Cardiovascular Disease

## 2012-12-20 ENCOUNTER — Encounter: Payer: Self-pay | Admitting: Cardiovascular Disease

## 2012-12-20 VITALS — BP 130/82 | HR 67 | Ht 72.0 in | Wt 192.0 lb

## 2012-12-20 DIAGNOSIS — K219 Gastro-esophageal reflux disease without esophagitis: Secondary | ICD-10-CM

## 2012-12-20 DIAGNOSIS — I2510 Atherosclerotic heart disease of native coronary artery without angina pectoris: Secondary | ICD-10-CM | POA: Diagnosis not present

## 2012-12-20 NOTE — Patient Instructions (Addendum)
Your physician wants you to follow-up in:  6 months. You will receive a reminder letter in the mail two months in advance. If you don't receive a letter, please call our office to schedule the follow-up appointment.   

## 2012-12-20 NOTE — Progress Notes (Signed)
History of Present Illness: 67 yo male with history of CAD, kidney stones, GERD here today for cardiac follow up. Exercise stress test arranged by Dr. Cato Mulligan on 03/19/12 with no chest pain but ST depression concerning for ischemia. Stress myoview was ordered on 04/02/12. He had good exercise tolerance, no chest pain but ST depression at peak exercise and inferior wall defect concerning for ischemia. Cardiac cath on 04/06/12 with moderately severe mid LAD lesion involving the diagonal branch. He was brought back for PCI on 04/11/12 and FFR of the LAD was 0.77. A Xience Expedition (2.75 mm x 23 mm) drug eluting stent was placed in the mid LAD and angioplasty was performed on the ostium of the diagonal. He had good response to Plavix on PRU testing. Recurrent chest pain November 2013 with cardiac cath 06/01/12 demonstrating stable disease with patency of LAD stent.   He is here today for cardiac follow up. He is feeling great. No chest pain or SOB.   Primary Care Physician: Birdie Sons  Last Lipid Profile:Lipid Panel     Component Value Date/Time   CHOL 108 12/06/2012 0942   TRIG 45.0 12/06/2012 0942   HDL 38.80* 12/06/2012 0942   CHOLHDL 3 12/06/2012 0942   VLDL 9.0 12/06/2012 0942   LDLCALC 60 12/06/2012 0942     Past Medical History  Diagnosis Date  . Nephrolithiasis   . Coronary artery disease     a. ETT 9/13: + ischemic ECG changes;  b. ETT-MV 9/13: EF 62%;  + ischemic EKG changes and + inf ischemia;  c. LHC 9/13: pLAD 30%, mLAD 70-80%, oDx 40-50% ==> FFR hemodynamically significant stenosis in mLAD ==> PCI: Xience Xpedition DES to mLAD; Cath 06/01/12   single vessel CAD w/ patent mid LAD stent and normal LV systolic function  . GERD (gastroesophageal reflux disease)   . Hyperlipidemia LDL goal < 70   . Fibromyalgia     Past Surgical History  Procedure Laterality Date  . Inguinal hernia repair  1990's    right  . Knee arthroscopy  1990's    right  . Cataract extraction w/ intraocular  lens implant  08/2011    right  . Cardiac catheterization  04/06/2012; 06/01/2012  . Coronary angioplasty with stent placement  04/11/2012    DES LAD  . Cystoscopy w/ stone manipulation  2009    Current Outpatient Prescriptions  Medication Sig Dispense Refill  . aspirin EC 81 MG tablet Take 81 mg by mouth at bedtime.      Marland Kitchen atorvastatin (LIPITOR) 10 MG tablet Take 1 tablet (10 mg total) by mouth daily.  90 tablet  3  . Melatonin 3 MG TABS Take 3 mg by mouth at bedtime.       . nitroGLYCERIN (NITROSTAT) 0.4 MG SL tablet Place 1 tablet (0.4 mg total) under the tongue every 5 (five) minutes as needed for chest pain.  25 tablet  6  . pantoprazole (PROTONIX) 40 MG tablet TAKE 1 TABLET EVERY DAY  30 tablet  2  . prasugrel (EFFIENT) 10 MG TABS Take 1 tablet (10 mg total) by mouth daily.  30 tablet  11   No current facility-administered medications for this visit.    No Known Allergies  History   Social History  . Marital Status: Married    Spouse Name: N/A    Number of Children: 2  . Years of Education: N/A   Occupational History  . Retired    Social History Main Topics  .  Smoking status: Never Smoker   . Smokeless tobacco: Never Used  . Alcohol Use: No  . Drug Use: No  . Sexually Active: Not Currently   Other Topics Concern  . Not on file   Social History Narrative  . No narrative on file    Family History  Problem Relation Age of Onset  . Diabetes Mother   . Heart attack Mother 19  . Diabetes Father   . Heart attack Father 9  . Diabetes Sister   . Arthritis Brother     Review of Systems:  As stated in the HPI and otherwise negative.   BP 130/82  Pulse 67  Ht 6' (1.829 m)  Wt 192 lb (87.091 kg)  BMI 26.03 kg/m2  Physical Examination: General: Well developed, well nourished, NAD HEENT: OP clear, mucus membranes moist SKIN: warm, dry. No rashes. Neuro: No focal deficits Musculoskeletal: Muscle strength 5/5 all ext Psychiatric: Mood and affect  normal Neck: No JVD, no carotid bruits, no thyromegaly, no lymphadenopathy. Lungs:Clear bilaterally, no wheezes, rhonci, crackles Cardiovascular: Regular rate and rhythm. No murmurs, gallops or rubs. Abdomen:Soft. Bowel sounds present. Non-tender.  Extremities: No lower extremity edema. Pulses are 2 + in the bilateral DP/PT.  Assessment and Plan:   Cardiac cath 06/01/12:  Left main: No obstructive disease noted.  Left Anterior Descending Artery: Moderate caliber vessel that courses to the apex. The proximal vessel has a 30% stenosis. The mid vessel has a patent stented segment with no evidence of restenosis. The distal LAD has no obstructive disease. The diagonal branch is covered by the LAD stent and has a 10% ostial stenosis. There is excellent flow down the diagonal branch.  Intermediate Artery: Small caliber vessel with 30% stenosis.  Circumflex Artery: Large caliber, co-dominant vessel with no obstructive disease noted. There are three small caliber obtuse marginal branches that have no obstructive disease noted.  Right Coronary Artery: Small caliber, co-dominant vessel with mild plaque in the proximal and mid segments.  Left Ventricular Angiogram: LVEF=60-65%   Assessment and Plan:   1. CAD: Stable.  He is on ASA, Effient, statin and Imdur. No beta blocker with bradycardia. Xanax prn anxiety.   2.GERD: Chest pain resolved with PPI.

## 2012-12-27 ENCOUNTER — Encounter: Payer: Self-pay | Admitting: Cardiovascular Disease

## 2013-02-19 ENCOUNTER — Other Ambulatory Visit (HOSPITAL_COMMUNITY): Payer: Self-pay | Admitting: Internal Medicine

## 2013-03-07 ENCOUNTER — Encounter: Payer: Self-pay | Admitting: Cardiovascular Disease

## 2013-03-12 ENCOUNTER — Telehealth: Payer: Self-pay | Admitting: *Deleted

## 2013-03-12 ENCOUNTER — Encounter: Payer: Self-pay | Admitting: Cardiovascular Disease

## 2013-03-12 NOTE — Telephone Encounter (Signed)
Spoke with patient and this has been going on for 2-3 weeks. Symptoms are more frequent after eating. Patient denies any shortness of breath or worsening in symptoms with exertion. Symptoms do not resolve with rest.  Has not taken any OTC antacids, just using the Protonix. Did work in the yard all day yesterday without any problems. Patient states he did have problems like this prior to his stent a year ago and before being started on Protonix. Will forward to Dr [name] for review and recommendations.   From: Jeani Sow   Sent: 03/07/2013 8:43 PM EDT   To: Verne Carrow, MD   Subject: Non-Urgent Medical Question      Is it possible for the photonics to loose its effectiveness after taking it for a period of time? I have had some discomfort over the last couple of weeks in my chest area but it has also been associated with a lot of gas, belching and a little stomach discomfort. Pain has not been severe but enough that it got my attention. The chest pain seems to appear more frequently just after eating. Advise

## 2013-03-12 NOTE — Telephone Encounter (Signed)
Spoke with patient and he has not changed his diet and is already careful with these things.  Will forward to Dr [name] to see if he would like to change/increase medication

## 2013-03-12 NOTE — Telephone Encounter (Signed)
Discussed with patient, scheduled appointment with Dr Justine Null tomorrow since there was an appointment available.

## 2013-03-12 NOTE — Telephone Encounter (Signed)
Left message to call back  

## 2013-03-12 NOTE — Telephone Encounter (Signed)
I would advise him to avoid spicy meals, late night snacks, continue Protonix. If he begins to have exertional symptoms, let us know or if there are any changes in the way he feels with SOB, dizziness. Thanks, chris

## 2013-03-12 NOTE — Telephone Encounter (Signed)
F/up   Pt returned call for response.

## 2013-03-12 NOTE — Telephone Encounter (Signed)
His pain does not sound cardiac but I would be glad to see him this week or next week if he wishes. I have no recommendations for changes in meds in regards to GERD. He could check with primary care to see if they have any suggestions. Thayer Ohm

## 2013-03-13 ENCOUNTER — Encounter: Payer: Self-pay | Admitting: Cardiovascular Disease

## 2013-03-13 ENCOUNTER — Ambulatory Visit (INDEPENDENT_AMBULATORY_CARE_PROVIDER_SITE_OTHER): Payer: Medicare Other | Admitting: Cardiovascular Disease

## 2013-03-13 VITALS — BP 125/80 | HR 62 | Ht 72.0 in | Wt 189.0 lb

## 2013-03-13 DIAGNOSIS — I2510 Atherosclerotic heart disease of native coronary artery without angina pectoris: Secondary | ICD-10-CM

## 2013-03-13 DIAGNOSIS — R079 Chest pain, unspecified: Secondary | ICD-10-CM

## 2013-03-13 NOTE — Progress Notes (Signed)
History of Present Illness: 67 yo male with history of CAD, kidney stones, GERD here today for cardiac follow up. Exercise stress test arranged by Dr. Cato Mulligan on 03/19/12 with no chest pain but ST depression concerning for ischemia. Stress myoview was ordered on 04/02/12. He had good exercise tolerance, no chest pain but ST depression at peak exercise and inferior wall defect concerning for ischemia. Cardiac cath on 04/06/12 with moderately severe mid LAD lesion involving the diagonal branch. He was brought back for PCI on 04/11/12 and FFR of the LAD was 0.77. A Xience Expedition (2.75 mm x 23 mm) drug eluting stent was placed in the mid LAD and angioplasty was performed on the ostium of the diagonal. He had good response to Plavix on PRU testing. Recurrent chest pain November 2013 with cardiac cath 06/01/12 demonstrating stable disease with patency of LAD stent.   He is here today for cardiac follow up. He describes heaviness in his left shoulder and tingling left arm. This is not related to exertion. No associated SOB, dizziness, palpitations. He worked in the yard all weekend and had no chest pressure. Left shoulder is sore to palpation.    Primary Care Physician: Birdie Sons  Last Lipid Profile:Lipid Panel     Component Value Date/Time   CHOL 108 12/06/2012 0942   TRIG 45.0 12/06/2012 0942   HDL 38.80* 12/06/2012 0942   CHOLHDL 3 12/06/2012 0942   VLDL 9.0 12/06/2012 0942   LDLCALC 60 12/06/2012 0942     Past Medical History  Diagnosis Date  . Nephrolithiasis   . Coronary artery disease     a. ETT 9/13: + ischemic ECG changes;  b. ETT-MV 9/13: EF 62%;  + ischemic EKG changes and + inf ischemia;  c. LHC 9/13: pLAD 30%, mLAD 70-80%, oDx 40-50% ==> FFR hemodynamically significant stenosis in mLAD ==> PCI: Xience Xpedition DES to mLAD; Cath 06/01/12   single vessel CAD w/ patent mid LAD stent and normal LV systolic function  . GERD (gastroesophageal reflux disease)   . Hyperlipidemia LDL goal < 70    . Fibromyalgia     Past Surgical History  Procedure Laterality Date  . Inguinal hernia repair  1990's    right  . Knee arthroscopy  1990's    right  . Cataract extraction w/ intraocular lens implant  08/2011    right  . Cardiac catheterization  04/06/2012; 06/01/2012  . Coronary angioplasty with stent placement  04/11/2012    DES LAD  . Cystoscopy w/ stone manipulation  2009    Current Outpatient Prescriptions  Medication Sig Dispense Refill  . aspirin EC 81 MG tablet Take 81 mg by mouth at bedtime.      . Melatonin 3 MG TABS Take 3 mg by mouth at bedtime.       . nitroGLYCERIN (NITROSTAT) 0.4 MG SL tablet Place 1 tablet (0.4 mg total) under the tongue every 5 (five) minutes as needed for chest pain.  25 tablet  6  . pantoprazole (PROTONIX) 40 MG tablet TAKE 1 TABLET EVERY DAY  30 tablet  2  . prasugrel (EFFIENT) 10 MG TABS Take 1 tablet (10 mg total) by mouth daily.  30 tablet  11   No current facility-administered medications for this visit.    No Known Allergies  History   Social History  . Marital Status: Married    Spouse Name: N/A    Number of Children: 2  . Years of Education: N/A   Occupational History  .  Retired    Social History Main Topics  . Smoking status: Never Smoker   . Smokeless tobacco: Never Used  . Alcohol Use: No  . Drug Use: No  . Sexual Activity: Not Currently   Other Topics Concern  . Not on file   Social History Narrative  . No narrative on file    Family History  Problem Relation Age of Onset  . Diabetes Mother   . Heart attack Mother 7  . Diabetes Father   . Heart attack Father 12  . Diabetes Sister   . Arthritis Brother     Review of Systems:  As stated in the HPI and otherwise negative.   BP 125/80  Pulse 62  Ht 6' (1.829 m)  Wt 189 lb (85.73 kg)  BMI 25.63 kg/m2  Physical Examination: General: Well developed, well nourished, NAD HEENT: OP clear, mucus membranes moist SKIN: warm, dry. No rashes. Neuro: No  focal deficits Musculoskeletal: Muscle strength 5/5 all ext Psychiatric: Mood and affect normal Neck: No JVD, no carotid bruits, no thyromegaly, no lymphadenopathy. Lungs:Clear bilaterally, no wheezes, rhonci, crackles Cardiovascular: Regular rate and rhythm. No murmurs, gallops or rubs. Abdomen:Soft. Bowel sounds present. Non-tender.  Extremities: No lower extremity edema. Pulses are 2 + in the bilateral DP/PT.  EKG: Sinus, rate 62 bpm.   Cardiac cath 06/01/12:  Left main: No obstructive disease noted.  Left Anterior Descending Artery: Moderate caliber vessel that courses to the apex. The proximal vessel has a 30% stenosis. The mid vessel has a patent stented segment with no evidence of restenosis. The distal LAD has no obstructive disease. The diagonal branch is covered by the LAD stent and has a 10% ostial stenosis. There is excellent flow down the diagonal branch.  Intermediate Artery: Small caliber vessel with 30% stenosis.  Circumflex Artery: Large caliber, co-dominant vessel with no obstructive disease noted. There are three small caliber obtuse marginal branches that have no obstructive disease noted.  Right Coronary Artery: Small caliber, co-dominant vessel with mild plaque in the proximal and mid segments.  Left Ventricular Angiogram: LVEF=60-65%   Assessment and Plan:   1. CAD: Stable. He is on ASA, Effient, statin and Imdur. No beta blocker with bradycardia. Xanax prn anxiety.   2.GERD: Continue PPI  3. Chest pain: Atypical. Does not sound cardiac. No ischemic testing at this time.

## 2013-03-13 NOTE — Patient Instructions (Addendum)
Your physician recommends that you schedule a follow-up appointment in:  About 3 months.  Scheduled for June 12, 2013 at 3:15

## 2013-04-26 DIAGNOSIS — Z23 Encounter for immunization: Secondary | ICD-10-CM | POA: Diagnosis not present

## 2013-04-28 ENCOUNTER — Encounter: Payer: Self-pay | Admitting: Internal Medicine

## 2013-05-27 ENCOUNTER — Other Ambulatory Visit (HOSPITAL_COMMUNITY): Payer: Self-pay | Admitting: Internal Medicine

## 2013-05-27 ENCOUNTER — Other Ambulatory Visit (HOSPITAL_COMMUNITY): Payer: Self-pay | Admitting: Cardiology

## 2013-05-28 ENCOUNTER — Encounter: Payer: Self-pay | Admitting: Cardiovascular Disease

## 2013-06-12 ENCOUNTER — Encounter: Payer: Self-pay | Admitting: Cardiovascular Disease

## 2013-06-12 ENCOUNTER — Ambulatory Visit (INDEPENDENT_AMBULATORY_CARE_PROVIDER_SITE_OTHER): Payer: Medicare Other | Admitting: Cardiovascular Disease

## 2013-06-12 VITALS — BP 140/70 | HR 87 | Ht 72.0 in | Wt 189.4 lb

## 2013-06-12 DIAGNOSIS — I2510 Atherosclerotic heart disease of native coronary artery without angina pectoris: Secondary | ICD-10-CM | POA: Diagnosis not present

## 2013-06-12 MED ORDER — CLOPIDOGREL BISULFATE 75 MG PO TABS: 75.0000 mg | ORAL_TABLET | Freq: Every day | ORAL | Status: DC

## 2013-06-12 NOTE — Patient Instructions (Signed)
Your physician wants you to follow-up in: 6 months.   You will receive a reminder letter in the mail two months in advance. If you don't receive a letter, please call our office to schedule the follow-up appointment.  Your physician has recommended you make the following change in your medication: Stop Effient. Start clopidogrel 75 mg by mouth daily

## 2013-06-12 NOTE — Progress Notes (Signed)
9   History of Present Illness: 67 yo male with history of CAD, kidney stones, GERD here today for cardiac follow up. Exercise stress test arranged by Dr. Cato Mulligan on 03/19/12 with no chest pain but ST depression concerning for ischemia. Stress myoview was ordered on 04/02/12. He had good exercise tolerance, no chest pain but ST depression at peak exercise and inferior wall defect concerning for ischemia. Cardiac cath on 04/06/12 with moderately severe mid LAD lesion involving the diagonal branch. He was brought back for PCI on 04/11/12 and FFR of the LAD was 0.77. A Xience Expedition (2.75 mm x 23 mm) drug eluting stent was placed in the mid LAD and angioplasty was performed on the ostium of the diagonal. He had good response to Plavix on PRU testing. Recurrent chest pain November 2013 with cardiac cath 06/01/12 demonstrating stable disease with patency of LAD stent. His statin was stopped in primary care earlier this year. He reports.   He is here today for cardiac follow up. He has been doing well. No chest pain or SOB. He has been active. He has been raking leaves. BP has been good at home (120-130 systolically).     Primary Care Physician: Birdie Sons  Last Lipid Profile:Lipid Panel     Component Value Date/Time   CHOL 108 12/06/2012 0942   TRIG 45.0 12/06/2012 0942   HDL 38.80* 12/06/2012 0942   CHOLHDL 3 12/06/2012 0942   VLDL 9.0 12/06/2012 0942   LDLCALC 60 12/06/2012 0942     Past Medical History  Diagnosis Date  . Nephrolithiasis   . Coronary artery disease     a. ETT 9/13: + ischemic ECG changes;  b. ETT-MV 9/13: EF 62%;  + ischemic EKG changes and + inf ischemia;  c. LHC 9/13: pLAD 30%, mLAD 70-80%, oDx 40-50% ==> FFR hemodynamically significant stenosis in mLAD ==> PCI: Xience Xpedition DES to mLAD; Cath 06/01/12   single vessel CAD w/ patent mid LAD stent and normal LV systolic function  . GERD (gastroesophageal reflux disease)   . Hyperlipidemia LDL goal < 70   . Fibromyalgia      Past Surgical History  Procedure Laterality Date  . Inguinal hernia repair  1990's    right  . Knee arthroscopy  1990's    right  . Cataract extraction w/ intraocular lens implant  08/2011    right  . Cardiac catheterization  04/06/2012; 06/01/2012  . Coronary angioplasty with stent placement  04/11/2012    DES LAD  . Cystoscopy w/ stone manipulation  2009    Current Outpatient Prescriptions  Medication Sig Dispense Refill  . aspirin EC 81 MG tablet Take 81 mg by mouth at bedtime.      Marland Kitchen EFFIENT 10 MG TABS tablet TAKE 1 TABLET EVERY DAY  30 tablet  0  . Melatonin 3 MG TABS Take 3 mg by mouth at bedtime.       . nitroGLYCERIN (NITROSTAT) 0.4 MG SL tablet Place 1 tablet (0.4 mg total) under the tongue every 5 (five) minutes as needed for chest pain.  25 tablet  6  . pantoprazole (PROTONIX) 40 MG tablet TAKE 1 TABLET EVERY DAY  30 tablet  2   No current facility-administered medications for this visit.    No Known Allergies  History   Social History  . Marital Status: Married    Spouse Name: N/A    Number of Children: 2  . Years of Education: N/A   Occupational History  .  Retired    Social History Main Topics  . Smoking status: Never Smoker   . Smokeless tobacco: Never Used  . Alcohol Use: No  . Drug Use: No  . Sexual Activity: Not Currently   Other Topics Concern  . Not on file   Social History Narrative  . No narrative on file    Family History  Problem Relation Age of Onset  . Diabetes Mother   . Heart attack Mother 39  . Diabetes Father   . Heart attack Father 83  . Diabetes Sister   . Arthritis Brother     Review of Systems:  As stated in the HPI and otherwise negative.   BP 140/70  Pulse 87  Ht 6' (1.829 m)  Wt 189 lb 6.4 oz (85.911 kg)  BMI 25.68 kg/m2  Physical Examination: General: Well developed, well nourished, NAD HEENT: OP clear, mucus membranes moist SKIN: warm, dry. No rashes. Neuro: No focal deficits Musculoskeletal: Muscle  strength 5/5 all ext Psychiatric: Mood and affect normal Neck: No JVD, no carotid bruits, no thyromegaly, no lymphadenopathy. Lungs:Clear bilaterally, no wheezes, rhonci, crackles Cardiovascular: Regular rate and rhythm. No murmurs, gallops or rubs. Abdomen:Soft. Bowel sounds present. Non-tender.  Extremities: No lower extremity edema. Pulses are 2 + in the bilateral DP/PT.  Cardiac cath 06/01/12:  Left main: No obstructive disease noted.  Left Anterior Descending Artery: Moderate caliber vessel that courses to the apex. The proximal vessel has a 30% stenosis. The mid vessel has a patent stented segment with no evidence of restenosis. The distal LAD has no obstructive disease. The diagonal branch is covered by the LAD stent and has a 10% ostial stenosis. There is excellent flow down the diagonal branch.  Intermediate Artery: Small caliber vessel with 30% stenosis.  Circumflex Artery: Large caliber, co-dominant vessel with no obstructive disease noted. There are three small caliber obtuse marginal branches that have no obstructive disease noted.  Right Coronary Artery: Small caliber, co-dominant vessel with mild plaque in the proximal and mid segments.  Left Ventricular Angiogram: LVEF=60-65%   Assessment and Plan:   1. CAD: Stable. He is on ASA, Effient. Will stop Effient and start Plavix 75 mg po Qdaily.  No beta blocker with bradycardia. Xanax prn anxiety. He is off of statins due to myalgias.   2.GERD: Continue PPI

## 2013-07-11 ENCOUNTER — Encounter: Payer: Self-pay | Admitting: Internal Medicine

## 2013-07-16 ENCOUNTER — Ambulatory Visit (INDEPENDENT_AMBULATORY_CARE_PROVIDER_SITE_OTHER): Payer: Medicare Other | Admitting: Family

## 2013-07-16 ENCOUNTER — Encounter: Payer: Self-pay | Admitting: Family

## 2013-07-16 VITALS — BP 130/80 | HR 72 | Wt 196.0 lb

## 2013-07-16 DIAGNOSIS — R519 Headache, unspecified: Secondary | ICD-10-CM

## 2013-07-16 DIAGNOSIS — R51 Headache: Secondary | ICD-10-CM

## 2013-07-16 LAB — CBC WITH DIFFERENTIAL/PLATELET
Basophils Absolute: 0 10*3/uL (ref 0.0–0.1)
Basophils Relative: 0.4 % (ref 0.0–3.0)
EOS ABS: 0.4 10*3/uL (ref 0.0–0.7)
EOS PCT: 8.2 % — AB (ref 0.0–5.0)
HCT: 41.2 % (ref 39.0–52.0)
Hemoglobin: 14.2 g/dL (ref 13.0–17.0)
Lymphocytes Relative: 35 % (ref 12.0–46.0)
Lymphs Abs: 1.6 10*3/uL (ref 0.7–4.0)
MCHC: 34.4 g/dL (ref 30.0–36.0)
MCV: 90.5 fl (ref 78.0–100.0)
MONO ABS: 0.4 10*3/uL (ref 0.1–1.0)
Monocytes Relative: 8.3 % (ref 3.0–12.0)
NEUTROS PCT: 48.1 % (ref 43.0–77.0)
Neutro Abs: 2.2 10*3/uL (ref 1.4–7.7)
PLATELETS: 230 10*3/uL (ref 150.0–400.0)
RBC: 4.55 Mil/uL (ref 4.22–5.81)
RDW: 12.8 % (ref 11.5–14.6)
WBC: 4.6 10*3/uL (ref 4.5–10.5)

## 2013-07-16 LAB — COMPREHENSIVE METABOLIC PANEL
ALK PHOS: 56 U/L (ref 39–117)
ALT: 27 U/L (ref 0–53)
AST: 24 U/L (ref 0–37)
Albumin: 4.3 g/dL (ref 3.5–5.2)
BILIRUBIN TOTAL: 0.8 mg/dL (ref 0.3–1.2)
BUN: 20 mg/dL (ref 6–23)
CO2: 28 meq/L (ref 19–32)
Calcium: 9.1 mg/dL (ref 8.4–10.5)
Chloride: 104 mEq/L (ref 96–112)
Creatinine, Ser: 1.3 mg/dL (ref 0.4–1.5)
GFR: 60.16 mL/min (ref >60.00)
Glucose, Bld: 98 mg/dL (ref 70–99)
Potassium: 5.1 mEq/L (ref 3.5–5.1)
SODIUM: 138 meq/L (ref 135–145)
TOTAL PROTEIN: 7.5 g/dL (ref 6.0–8.3)

## 2013-07-16 LAB — SEDIMENTATION RATE: SED RATE: 11 mm/h (ref 0–22)

## 2013-07-16 MED ORDER — IBUPROFEN 800 MG PO TABS: 800.0000 mg | ORAL_TABLET | Freq: Three times a day (TID) | ORAL | Status: DC | PRN

## 2013-07-16 NOTE — Progress Notes (Signed)
Subjective:    Patient ID: Richard Mahoney, male    DOB: 02/12/1946, 68 y.o.   MRN: [id number] HPI  68 year old caucasian male, nonsmoker, presenting today with a headache that has been present since October, waxing and waning in intensity, worse 1st thing in the morning.  The pain began in the beginning of October after visiting his daughter in [place].  Pain present behind ears , neck and back of head. Pain is constant.  On scale of 1-10, pain at 7 most of the time.   He says pain never goes away.  Pain lessens after taking 6-9 Advil a day.  He has also tried massaging the area but it has not helped. Denies any blurred vision or double vision. No family history of any brain tumors, aneurysm, or other neurologic disorders.    Review of Systems  Constitutional: Negative.   Eyes: Negative.   Respiratory: Negative.   Cardiovascular: Negative.   Gastrointestinal: Negative.   Endocrine: Negative.   Genitourinary: Negative.   Musculoskeletal: Negative.   Skin: Negative.   Allergic/Immunologic: Negative.   Neurological: Positive for headaches. Negative for dizziness, speech difficulty, weakness and light-headedness.       Headache present since October  Hematological: Negative.   Psychiatric/Behavioral: Negative.    Past Medical History  Diagnosis Date  . Nephrolithiasis   . Coronary artery disease     a. ETT 9/13: + ischemic ECG changes;  b. ETT-MV 9/13: EF 62%;  + ischemic EKG changes and + inf ischemia;  c. LHC 9/13: pLAD 30%, mLAD 70-80%, oDx 40-50% ==> FFR hemodynamically significant stenosis in mLAD ==> PCI: Xience Xpedition DES to mLAD; Cath 06/01/12   single vessel CAD w/ patent mid LAD stent and normal LV systolic function  . GERD (gastroesophageal reflux disease)   . Hyperlipidemia LDL goal < 70   . Fibromyalgia     History   Social History  . Marital Status: Married    Spouse Name: N/A    Number of Children: 2  . Years of Education: N/A   Occupational History  .  Retired    Social History Main Topics  . Smoking status: Never Smoker   . Smokeless tobacco: Never Used  . Alcohol Use: No  . Drug Use: No  . Sexual Activity: Not Currently   Other Topics Concern  . Not on file   Social History Narrative  . No narrative on file    Past Surgical History  Procedure Laterality Date  . Inguinal hernia repair  1990's    right  . Knee arthroscopy  1990's    right  . Cataract extraction w/ intraocular lens implant  08/2011    right  . Cardiac catheterization  04/06/2012; 06/01/2012  . Coronary angioplasty with stent placement  04/11/2012    DES LAD  . Cystoscopy w/ stone manipulation  2009    Family History  Problem Relation Age of Onset  . Diabetes Mother   . Heart attack Mother 23  . Diabetes Father   . Heart attack Father 37  . Diabetes Sister   . Arthritis Brother     No Known Allergies  Current Outpatient Prescriptions on File Prior to Visit  Medication Sig Dispense Refill  . aspirin EC 81 MG tablet Take 81 mg by mouth at bedtime.      . clopidogrel (PLAVIX) 75 MG tablet Take 1 tablet (75 mg total) by mouth daily.  30 tablet  11  . nitroGLYCERIN (NITROSTAT) 0.4  MG SL tablet Place 1 tablet (0.4 mg total) under the tongue every 5 (five) minutes as needed for chest pain.  25 tablet  6  . pantoprazole (PROTONIX) 40 MG tablet TAKE 1 TABLET EVERY DAY  30 tablet  2  . Melatonin 3 MG TABS Take 3 mg by mouth at bedtime.        No current facility-administered medications on file prior to visit.    BP 130/80  Pulse 72  Wt 196 lb (88.905 kg)chart    Objective:   Physical Exam  Constitutional: He is oriented to person, place, and time. He appears well-developed and well-nourished.  HENT:  Head: Normocephalic.  Right Ear: External ear normal.  Left Ear: External ear normal.  Eyes: Conjunctivae and EOM are normal. Pupils are equal, round, and reactive to light.  Neck: Normal range of motion. Neck supple.  Cardiovascular: Normal rate,  regular rhythm and normal heart sounds.   Pulmonary/Chest: Effort normal and breath sounds normal.  Musculoskeletal: Normal range of motion.  Neurological: He is alert and oriented to person, place, and time. He has normal reflexes. He displays normal reflexes. No cranial nerve deficit. Coordination normal.  Skin: Skin is warm and dry.         Assessment & Plan:  Assessment  1. Occipital headache-constant  Plan Ibuprofen 800 mg PO three times a day.  Obtain labs that include BMP, CBC, and ESR.  Referral to [facility] for CT of head.  Contact office if pain persists or worsen. Consider Neurology referral.

## 2013-07-16 NOTE — Patient Instructions (Addendum)
1. CT head 2. Labs obtained 3. Ibuprofen with food 3 times a day as needed.

## 2013-07-17 ENCOUNTER — Telehealth: Payer: Self-pay | Admitting: Internal Medicine

## 2013-07-17 ENCOUNTER — Other Ambulatory Visit: Payer: Self-pay | Admitting: Family

## 2013-07-17 DIAGNOSIS — R519 Headache, unspecified: Secondary | ICD-10-CM

## 2013-07-17 DIAGNOSIS — R51 Headache: Principal | ICD-10-CM

## 2013-07-17 NOTE — Telephone Encounter (Signed)
Pt would like to ensure this request for MRI goes to [facility].  [phone] (diagnostic dept @ [facility]) Pt states he would like to go ASAP. His headaches are getting worse and he has put off long enough. pls advise

## 2013-07-17 NOTE — Telephone Encounter (Signed)
Note from office visit states that pt requests CT of head be done at [facility]

## 2013-07-18 ENCOUNTER — Other Ambulatory Visit: Payer: Self-pay | Admitting: Family

## 2013-07-18 DIAGNOSIS — R51 Headache: Principal | ICD-10-CM

## 2013-07-18 DIAGNOSIS — R519 Headache, unspecified: Secondary | ICD-10-CM

## 2013-07-19 DIAGNOSIS — R51 Headache: Secondary | ICD-10-CM | POA: Diagnosis not present

## 2013-07-29 ENCOUNTER — Ambulatory Visit (INDEPENDENT_AMBULATORY_CARE_PROVIDER_SITE_OTHER): Payer: Medicare Other | Admitting: Internal Medicine

## 2013-07-29 ENCOUNTER — Encounter: Payer: Self-pay | Admitting: Internal Medicine

## 2013-07-29 VITALS — BP 130/76 | Temp 98.1°F | Ht 72.0 in | Wt 201.0 lb

## 2013-07-29 DIAGNOSIS — R51 Headache: Secondary | ICD-10-CM

## 2013-07-29 MED ORDER — HYDROCODONE-ACETAMINOPHEN 10-325 MG PO TABS: 1.0000 | ORAL_TABLET | Freq: Three times a day (TID) | ORAL | Status: DC | PRN

## 2013-07-29 MED ORDER — AMOXICILLIN-POT CLAVULANATE 875-125 MG PO TABS: 1.0000 | ORAL_TABLET | Freq: Two times a day (BID) | ORAL | Status: AC

## 2013-07-29 NOTE — Progress Notes (Signed)
Pre visit review using our clinic review tool, if applicable. No additional management support is needed unless otherwise documented below in the visit note. 

## 2013-07-29 NOTE — Progress Notes (Signed)
10/14 Started having headaches Using advil  See padonda's note. Ibuprofen with some help Sed rate 11  Left sided head pain, scalp can be sore to touch. Pain is persistent but does wax and wane.  Headache was much worse in [place].   Pain gets worse if he moves his head quickly  Leg injury- big hematoma right leg  Reviewed mri brain Reviewed pmh, psh, sochx Reviewed meds  Ros- no other complaints  BP 130/76  Temp(Src) 98.1 F (36.7 C) (Oral)  Ht 6' (1.829 m)  Wt 201 lb (91.173 kg)  BMI 27.25 kg/m2  well-developed well-nourished male in no acute distress. HEENT exam atraumatic, normocephalic, neck supple without jugular venous distention. Chest clear to auscultation cardiac exam S1-S2 are regular. Abdominal exam overweight with bowel sounds, soft and nontender. Extremities no edema. Neurologic exam is alert with a normal gait.  Headache: ? Cause- Note ethmoid sinus fluid- will treat emperically as sinusitis Treat with hydrocodone for pain Call in 7 days if sxs not better augmentin

## 2013-08-05 ENCOUNTER — Encounter: Payer: Self-pay | Admitting: Internal Medicine

## 2013-08-05 DIAGNOSIS — R51 Headache: Principal | ICD-10-CM

## 2013-08-05 DIAGNOSIS — G8929 Other chronic pain: Secondary | ICD-10-CM

## 2013-08-09 NOTE — Telephone Encounter (Signed)
Dr Leanne Chang in basket note: Call patient. I don't think the headaches are coming from sinuses. He's been treated. The imaging test the head was not very impressive for sinus problems anyway. I think he needs further evaluation. Refer to neurology or headache clinic for evaluation of headache. Also it is possible that his headache is caused by degenerative disc disease in his neck. Refer to physical therapy for evaluation and treatment of neck pain, headache.   Pt aware, he wants to hold off on referral to physical therapy.  He stated if neurology couldn't figure it out then he would go the PT route, referral order placed for neurology

## 2013-08-12 ENCOUNTER — Encounter: Payer: Self-pay | Admitting: Internal Medicine

## 2013-08-13 ENCOUNTER — Telehealth: Payer: Self-pay | Admitting: Internal Medicine

## 2013-08-13 ENCOUNTER — Encounter: Payer: Self-pay | Admitting: Internal Medicine

## 2013-08-13 NOTE — Telephone Encounter (Signed)
Pt states his headaches are getting worse and he is finding it difficult to wait until 3/2 for an appt w/ neuro md.  Would like to know if any way possible to get a stat referral for headaches?

## 2013-08-13 NOTE — Telephone Encounter (Signed)
Richard Mahoney, pt states that dr Georgie Chard office wants a copy of pt's mri. Is that something you can send?

## 2013-08-13 NOTE — Telephone Encounter (Signed)
Janace Hoard, can you see if someone else can get him sooner?

## 2013-08-16 ENCOUNTER — Ambulatory Visit (INDEPENDENT_AMBULATORY_CARE_PROVIDER_SITE_OTHER): Payer: PRIVATE HEALTH INSURANCE | Admitting: Neurology

## 2013-08-16 ENCOUNTER — Encounter: Payer: Self-pay | Admitting: Family

## 2013-08-16 ENCOUNTER — Encounter: Payer: Self-pay | Admitting: Neurology

## 2013-08-16 VITALS — BP 138/82 | HR 74 | Temp 98.3°F | Resp 20 | Ht 72.0 in | Wt 187.6 lb

## 2013-08-16 DIAGNOSIS — R51 Headache: Secondary | ICD-10-CM

## 2013-08-16 DIAGNOSIS — M531 Cervicobrachial syndrome: Secondary | ICD-10-CM | POA: Diagnosis not present

## 2013-08-16 DIAGNOSIS — M5481 Occipital neuralgia: Secondary | ICD-10-CM

## 2013-08-16 DIAGNOSIS — R519 Headache, unspecified: Secondary | ICD-10-CM | POA: Insufficient documentation

## 2013-08-16 MED ORDER — NORTRIPTYLINE HCL 10 MG PO CAPS: ORAL_CAPSULE | ORAL | Status: DC

## 2013-08-16 NOTE — Patient Instructions (Signed)
1. Start nortriptyline 10mg  capsules: take 1 cap at bedtime for 1 week, then 2 caps at bedtime for 1 week, then 3 caps at bedtime 2. Monitor for drowsiness, dizziness, dry mouth, urinary retention 3. Follow-up in 4 weeks

## 2013-08-16 NOTE — Progress Notes (Signed)
NEUROLOGY CONSULTATION NOTE  MD SMOLA MRN: [id number] DOB: 1946-04-07  Referring provider: Dr. Phoebe Sharps Primary care provider: Dr. Phoebe Sharps  Reason for consult:  Headaches  Dear Dr Leanne Chang:  Thank you for your kind referral of Richard Mahoney for consultation of headaches. Although his history is well known to you, please allow me to reiterate it for the purpose of our medical record.  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  The pain is constant, waxing and waning in intensity from a 4 to 10/10, primarily behind both ears, and in the bilateral parieto-occipital regions.  He describes a tightening sensation that when severe, feels like there is pressure behind his eyes.  Pain is worse when he wakes up in the morning.  Moving his head side to side also exacerbates the pain.  There is associated scalp soreness and tenderness to palpation. Mild sensitivity to sounds.  There is no associated nausea, vomiting, photophobia, focal numbness/tingling/weakness.  He had tried Ibuprofen 864m TID and a dose of hydrocodone which were ineffective.  Hydrocodone caused disorientation.  Over the past 2 weeks, he has only been taking 2 tablets of Aleve in the morning.  He is typically very active, but recently does not feel like doing anything due to continued pain.  He denies any prior history of frequent headaches.  His sister has migraines.  He denies any dizziness, diplopia, dysarthria, dysphagia, back pain, bowel or bladder dysfunction.  He denies any falls or head injuries.  He reports sleep is good, he had stopped taking melatonin 2 months ago with no worsening.  He denies any snoring.    Labs reviewed from  January 2015, CBC, CMP normal, ESR 11.  [facility] reporting scattered punctate T2 and FLAIR signal within the white matter, consistent with mild chronic small vessel changes.  No acute changes.  Films unavailable for review at this time.  Laboratory Data: Lab Results  Component Value Date   WBC 4.6 07/16/2013   HGB 14.2 07/16/2013   HCT 41.2 07/16/2013   MCV 90.5 07/16/2013   PLT 230.0 07/16/2013     Chemistry      Component Value Date/Time   NA 138 07/16/2013 1129   K 5.1 07/16/2013 1129   CL 104 07/16/2013 1129   CO2 28 07/16/2013 1129   BUN 20 07/16/2013 1129   CREATININE 1.3 07/16/2013 1129      Component Value Date/Time   CALCIUM 9.1 07/16/2013 1129   ALKPHOS 56 07/16/2013 1129   AST 24 07/16/2013 1129   ALT 27 07/16/2013 1129   BILITOT 0.8 07/16/2013 1129     Lab Results  Component Value Date   ESRSEDRATE 11 07/16/2013     PAST MEDICAL HISTORY: Past Medical History  Diagnosis Date  . Nephrolithiasis   . Coronary artery disease     a. ETT 9/13: + ischemic ECG changes;  b. ETT-MV 9/13: EF 62%;  + ischemic EKG changes and + inf ischemia;  c. LHC 9/13: pLAD 30%, mLAD 70-80%, oDx 40-50% ==> FFR hemodynamically significant stenosis in mLAD ==> PCI: Xience Xpedition DES to mLAD; Cath 06/01/12   single vessel CAD w/ patent mid LAD stent  and normal LV systolic function  . GERD (gastroesophageal reflux disease)   . Hyperlipidemia LDL goal < 70   . Fibromyalgia     PAST SURGICAL HISTORY: Past Surgical History  Procedure Laterality Date  . Inguinal hernia repair  1990's    right  . Knee arthroscopy  1990's    right  . Cataract extraction w/ intraocular lens implant  08/2011    right  . Cardiac catheterization  04/06/2012; 06/01/2012  . Coronary angioplasty with stent placement  04/11/2012    DES LAD  . Cystoscopy w/ stone manipulation  2009    MEDICATIONS: Current Outpatient Prescriptions on File Prior to Visit  Medication Sig Dispense  Refill  . aspirin EC 81 MG tablet Take 81 mg by mouth at bedtime.      . clopidogrel (PLAVIX) 75 MG tablet Take 1 tablet (75 mg total) by mouth daily.  30 tablet  11  . nitroGLYCERIN (NITROSTAT) 0.4 MG SL tablet Place 1 tablet (0.4 mg total) under the tongue every 5 (five) minutes as needed for chest pain.  25 tablet  6  . pantoprazole (PROTONIX) 40 MG tablet TAKE 1 TABLET EVERY DAY  30 tablet  2  . HYDROcodone-acetaminophen (NORCO) 10-325 MG per tablet Take 1 tablet by mouth every 8 (eight) hours as needed.  30 tablet  0  . ibuprofen (ADVIL,MOTRIN) 800 MG tablet Take 1 tablet (800 mg total) by mouth every 8 (eight) hours as needed.  90 tablet  0   No current facility-administered medications on file prior to visit.    ALLERGIES: Allergies  Allergen Reactions  . Sulfa Antibiotics Rash    rash    FAMILY HISTORY: Family History  Problem Relation Age of Onset  . Diabetes Mother   . Heart attack Mother 12  . Diabetes Father   . Heart attack Father 32  . Diabetes Sister   . Arthritis Brother     SOCIAL HISTORY: History   Social History  . Marital Status: Married    Spouse Name: N/A    Number of Children: 2  . Years of Education: N/A   Occupational History  . Retired    Social History Main Topics  . Smoking status: Never Smoker   . Smokeless tobacco: Never Used  . Alcohol Use: No  . Drug Use: No  . Sexual Activity: Not Currently   Other Topics Concern  . Not on file   Social History Narrative  . No narrative on file    REVIEW OF SYSTEMS: Constitutional: No fevers, chills, or sweats, no generalized fatigue, change in appetite Eyes: No visual changes, double vision, eye pain Ear, nose and throat: No hearing loss, ear pain, nasal congestion, sore throat Cardiovascular: No chest pain, palpitations Respiratory:  No shortness of breath at rest or with exertion, wheezes GastrointestinaI: No nausea, vomiting, diarrhea, abdominal pain, fecal incontinence Genitourinary:   No dysuria, urinary retention or frequency Musculoskeletal:  +upper neck pain, no back pain Integumentary: No rash, pruritus, skin lesions Neurological: as above Psychiatric: No depression, insomnia, anxiety Endocrine: No palpitations, fatigue, diaphoresis, mood swings, change in appetite, change in weight, increased thirst Hematologic/Lymphatic:  No anemia, purpura, petechiae. Allergic/Immunologic: no itchy/runny eyes, nasal congestion, recent allergic reactions, rashes  PHYSICAL EXAM: Filed Vitals:   08/16/13 0809  BP: 138/82  Pulse: 74  Temp: 98.3 F (36.8 C)  Resp: 20   General: No acute distress Head:  Normocephalic/atraumatic, tenderness to palpation over the bilateral parieto-occipital regions, left>right. No temporal tenderness Neck:  supple, no paraspinal tenderness, full range of motion Back: No paraspinal tenderness Heart: regular rate and rhythm Lungs: Clear to auscultation bilaterally. Vascular: No carotid bruits. Skin/Extremities: No rash, no edema Neurological Exam: Mental status: alert and oriented to person, place, and time, no dysarthria or dysphagia, intact fluency and comprehension. Cranial nerves: CN I: not tested CN II: pupils equal, round and reactive to light, visual fields intact, fundi unremarkable. CN III, IV, VI:  full range of motion, no nystagmus, no ptosis CN V: facial sensation intact CN VII: upper and lower face symmetric CN VIII: hearing intact CN IX, X: gag intact, uvula midline CN XI: sternocleidomastoid and trapezius muscles intact CN XII: tongue midline Bulk & Tone: normal, no fasciculations. Motor: 5/5 throughout with no pronator drift. Sensation: intact to light touch, cold, pin, vibration and joint position sense.  No extinction to double simultaneous stimulation.  Romberg test negative Deep Tendon Reflexes: +2 throughout, no clonus Plantar responses: downgoing bilaterally Finger to nose testing: no incoordination Gait: narrow-based  and steady, able to tandem walk adequately.  IMPRESSION: This is a pleasant 68 year old right-handed man with a history of new onset headaches that have been constant since October 2014.  Neurological exam is non-focal, with note of tenderness over the distribution of the occipital nerves, suggestive of occipital neuralgia.  Brain MRI without contrast unremarkable.  Findings were discussed with the patient, he will start low dose nortriptyline 49m qhs for 1 week, with slow uptitration to 364mqhs.  Side effects, including sedation, dizziness, dry mouth, urinary retention, were discussed. We discussed the option of occipital nerve blocks in the future.  He knows to call our office for any problems and will follow-up in 4 weeks.  Thank you for allowing me to participate in the care of this patient. Please do not hesitate to call for any questions or concerns.   KaEllouise NewerM.D.

## 2013-08-16 NOTE — Telephone Encounter (Signed)
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 If they're requesting films, which I'm unsure if that's the case, the patient will need to pick them up from the facility that he had them done at. It sounds like the patient should get clarification of what they actually need, first.

## 2013-08-20 ENCOUNTER — Encounter: Payer: Self-pay | Admitting: Neurology

## 2013-08-24 ENCOUNTER — Other Ambulatory Visit (HOSPITAL_COMMUNITY): Payer: Self-pay | Admitting: Internal Medicine

## 2013-08-29 ENCOUNTER — Encounter: Payer: Self-pay | Admitting: Neurology

## 2013-09-09 ENCOUNTER — Ambulatory Visit: Payer: PRIVATE HEALTH INSURANCE | Admitting: Neurology

## 2013-09-10 ENCOUNTER — Ambulatory Visit (INDEPENDENT_AMBULATORY_CARE_PROVIDER_SITE_OTHER): Payer: PRIVATE HEALTH INSURANCE | Admitting: Neurology

## 2013-09-10 ENCOUNTER — Encounter: Payer: Self-pay | Admitting: Neurology

## 2013-09-10 VITALS — BP 132/80 | HR 74 | Temp 97.5°F | Ht 72.0 in | Wt 196.4 lb

## 2013-09-10 DIAGNOSIS — M531 Cervicobrachial syndrome: Secondary | ICD-10-CM

## 2013-09-10 DIAGNOSIS — M542 Cervicalgia: Secondary | ICD-10-CM

## 2013-09-10 DIAGNOSIS — R51 Headache: Secondary | ICD-10-CM | POA: Diagnosis not present

## 2013-09-10 DIAGNOSIS — M5481 Occipital neuralgia: Secondary | ICD-10-CM

## 2013-09-10 MED ORDER — GABAPENTIN 300 MG PO CAPS: ORAL_CAPSULE | ORAL | Status: DC

## 2013-09-10 NOTE — Progress Notes (Signed)
NEUROLOGY FOLLOW UP OFFICE NOTE  Richard Mahoney [id number]  HISTORY OF PRESENT ILLNESS: I had the pleasure of seeing Richard Mahoney in follow-up in the neurology clinic on 09/10/2013.  The patient was last seen 1 month ago for daily headaches suggestive of occipital neuralgia.  Today he reports that pain starts in the middle of his neck and radiates up to the back of his head.  Since his last visit, he started nortriptyline and increased this to 56m qhs 4 days ago.  This has decreased the severity of the headaches, with periods in the day where pain is much less, however when he moves his head, a sharp pain again goes up.  He has also started noticing pain behind both eyes, no vision changes.  No temporal tenderness.  With the nortriptyline though, he has had significant dry mouth and a bad taste in his mouth. Heat and ice packs have not helped.   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 The pain is constant, waxing and waning in intensity from a 4 to 10/10, primarily behind both ears, and in the bilateral parieto-occipital regions. He describes a tightening sensation that when severe, feels like there is pressure behind his eyes. Pain is worse when he wakes up in the morning. Moving his head side to side also exacerbates the pain. There is associated scalp soreness and tenderness to palpation. Mild sensitivity to sounds. There is no associated nausea, vomiting, photophobia, focal numbness/tingling/weakness. He had tried Ibuprofen 708mTID and a dose of hydrocodone which were ineffective. Hydrocodone caused disorientation.   Labs reviewed from January 2015, CBC, CMP normal, ESR 11. [facility]  reporting scattered punctate T2 and FLAIR signal within the white matter, consistent with mild chronic small vessel changes. No acute changes. Films unavailable for review at this time.  PAST MEDICAL HISTORY: Past Medical History  Diagnosis Date  . Nephrolithiasis   . Coronary artery disease     a. ETT 9/13: + ischemic ECG changes;  b. ETT-MV 9/13: EF 62%;  + ischemic EKG changes and + inf ischemia;  c. LHC 9/13: pLAD 30%, mLAD 70-80%, oDx 40-50% ==> FFR hemodynamically significant stenosis in mLAD ==> PCI: Xience Xpedition DES to mLAD; Cath 06/01/12   single vessel CAD w/ patent mid LAD stent and normal LV systolic function  . GERD (gastroesophageal reflux disease)   . Hyperlipidemia LDL goal < 70   . Fibromyalgia     MEDICATIONS: Current Outpatient Prescriptions on File Prior to Visit  Medication Sig Dispense Refill  . aspirin EC 81 MG tablet Take 81 mg by mouth at bedtime.      . clopidogrel (PLAVIX) 75 MG tablet Take 1 tablet (75 mg total) by mouth daily.  30 tablet  11  . HYDROcodone-acetaminophen (NORCO) 10-325 MG per tablet Take 1 tablet by mouth every 8 (eight) hours as needed.  30 tablet  0  . ibuprofen (ADVIL,MOTRIN) 800 MG tablet Take 1 tablet (800 mg total) by mouth every 8 (eight) hours as needed.  90 tablet  0  . nitroGLYCERIN (NITROSTAT) 0.4 MG SL tablet Place 1 tablet (0.4 mg total) under the tongue every 5 (five) minutes as needed for chest pain.  25 tablet  6  . nortriptyline (PAMELOR) 10 MG capsule Take 1 tab at bedtime for 1 week, then increase to 2 tabs at bedtime for 1 week, then 3 tabs at bedtime and continue  90 capsule  3  . pantoprazole (PROTONIX) 40 MG tablet TAKE 1 TABLET EVERY DAY  30 tablet  2   No current facility-administered medications on file prior to visit.    ALLERGIES: Allergies  Allergen Reactions  . Sulfa Antibiotics Rash    rash    FAMILY HISTORY: Family History  Problem Relation Age of Onset  . Diabetes Mother   . Heart attack Mother 21    . Diabetes Father   . Heart attack Father 31  . Diabetes Sister   . Arthritis Brother     SOCIAL HISTORY: History   Social History  . Marital Status: Married    Spouse Name: N/A    Number of Children: 2  . Years of Education: N/A   Occupational History  . Retired    Social History Main Topics  . Smoking status: Never Smoker   . Smokeless tobacco: Never Used  . Alcohol Use: No  . Drug Use: No  . Sexual Activity: Not Currently   Other Topics Concern  . Not on file   Social History Narrative  . No narrative on file    REVIEW OF SYSTEMS: Constitutional: No fevers, chills, or sweats, no generalized fatigue, change in appetite Eyes: No visual changes, double vision, eye pain Ear, nose and throat: No hearing loss, ear pain, nasal congestion, sore throat Cardiovascular: No chest pain, palpitations Respiratory:  No shortness of breath at rest or with exertion, wheezes GastrointestinaI: No nausea, vomiting, diarrhea, abdominal pain, fecal incontinence Genitourinary:  No dysuria, urinary retention or frequency Musculoskeletal:  No neck pain, back pain Integumentary: No rash, pruritus, skin lesions Neurological: as above Psychiatric: No depression, insomnia, anxiety Endocrine: No palpitations, fatigue, diaphoresis, mood swings, change in appetite, change in weight, increased thirst Hematologic/Lymphatic:  No anemia, purpura, petechiae. Allergic/Immunologic: no itchy/runny eyes, nasal congestion, recent allergic reactions, rashes  PHYSICAL EXAM: Filed Vitals:   09/10/13 0952  BP: 132/80  Pulse: 74  Temp: 97.5 F (36.4 C)   General: No acute distress Head:  Normocephalic/atraumatic. Tenderness to palpation over the back of neck and mid-occipital region. No temporal tenderness. Neck: supple, no paraspinal tenderness, full range of motion Heart:  Regular rate and rhythm Lungs:  Clear to auscultation bilaterally Back: No paraspinal tenderness Skin/Extremities: No rash,  no edema Neurological Exam: alert and oriented to person, place, and time. No aphasia or dysarthria. Fund of knowledge is appropriate.  Recent and remote memory are intact.  Attention and concentration are normal.    Able to name objects and repeat phrases. Cranial nerves: Pupils equal, round, reactive to light.  Fundoscopic exam unremarkable, no papilledema. Extraocular movements intact with no nystagmus. Visual fields full. Facial sensation intact. No facial asymmetry. Tongue, uvula, palate midline.  Motor: Bulk and tone normal, muscle strength 5/5 throughout with no pronator drift.  Sensation to light touch, temperature and vibration intact.  No extinction to double simultaneous stimulation.  Deep tendon reflexes 2+ throughout, toes downgoing.  Finger to nose testing intact.  Gait narrow-based and steady, able to tandem walk adequately.  Romberg negative.  IMPRESSION: This is a pleasant 68 yo RH man with a history of new onset headaches that have been constant since October 2014. Neurological exam is non-focal, with note of tenderness over the occipital and cervical  region.  Brain MRI without contrast unremarkable. Considerations include occipital neuralgia versus cervicogenic headaches.  A cervical xray will be ordered for neck pain.  He is not tolerating nortriptyline, and will taper this off.  Other options were discussed, he will start gabapentin 351m qhs with uptitration schedule.  Side effects were discussed.  He will be scheduled for occipital nerve block with one of my partners, Dr. PPosey Pronto  Goals of treatment were discussed, he will follow-up in 2 months.   Thank you for allowing me to participate in his care.  Please do not hesitate to call for any questions or concerns.  The duration of this appointment visit was 26 minutes of face-to-face time with the patient.  Greater than 50% of this time was spent in counseling, explanation of diagnosis, planning of further management, and coordination of  care.   KEllouise Newer M.D.

## 2013-09-10 NOTE — Patient Instructions (Addendum)
1. Occipital nerve block with Dr. Posey Pronto on 09/17/2013 at 11:30pm 2. Cervical x-ray for neck pain March 4 @10am  please arrive 15 minutes earlier for check in [place] 3. Start gabapentin 300mg . Take 1 capsule at bedtime for 1 week, then increase to 2 capsules at bedtime for 1 week, then increase to 1 capsule in AM, 2 capsules in PM. 4. Taper off nortriptyline by 1 capsule every week. 5. Follow-up in 2 months

## 2013-09-11 ENCOUNTER — Ambulatory Visit (HOSPITAL_COMMUNITY)
Admission: RE | Admit: 2013-09-11 | Discharge: 2013-09-11 | Disposition: A | Discharge status: Home / Self Care | Payer: PRIVATE HEALTH INSURANCE | Source: Ambulatory Visit | Attending: Neurology | Admitting: Neurology

## 2013-09-11 DIAGNOSIS — M531 Cervicobrachial syndrome: Secondary | ICD-10-CM | POA: Insufficient documentation

## 2013-09-11 DIAGNOSIS — M47812 Spondylosis without myelopathy or radiculopathy, cervical region: Secondary | ICD-10-CM | POA: Diagnosis not present

## 2013-09-11 DIAGNOSIS — R51 Headache: Secondary | ICD-10-CM

## 2013-09-11 DIAGNOSIS — M542 Cervicalgia: Secondary | ICD-10-CM

## 2013-09-11 DIAGNOSIS — M5481 Occipital neuralgia: Secondary | ICD-10-CM

## 2013-09-12 ENCOUNTER — Other Ambulatory Visit: Payer: Self-pay | Admitting: *Deleted

## 2013-09-12 ENCOUNTER — Encounter: Payer: Self-pay | Admitting: Neurology

## 2013-09-12 DIAGNOSIS — M542 Cervicalgia: Secondary | ICD-10-CM

## 2013-09-13 ENCOUNTER — Telehealth: Payer: Self-pay | Admitting: Neurology

## 2013-09-13 NOTE — Telephone Encounter (Signed)
Discussed cervical xray, degenerative disease may be causing irritation and neck spasms that radiate up the head (cervicogenic headaches), at this time he would like to hold off on PT for now until after the occipital nerve block on Tuesday.  He states the gabapentin may be helping a little, however he has only been taking it a few days. Continue plan of care as discussed in office, hold off on PT for now.

## 2013-09-17 ENCOUNTER — Ambulatory Visit (INDEPENDENT_AMBULATORY_CARE_PROVIDER_SITE_OTHER): Payer: PRIVATE HEALTH INSURANCE | Admitting: Neurology

## 2013-09-17 ENCOUNTER — Encounter: Payer: Self-pay | Admitting: Neurology

## 2013-09-17 ENCOUNTER — Ambulatory Visit: Payer: PRIVATE HEALTH INSURANCE | Admitting: Neurology

## 2013-09-17 VITALS — BP 130/88 | HR 72 | Temp 97.8°F | Wt 197.2 lb

## 2013-09-17 DIAGNOSIS — M531 Cervicobrachial syndrome: Secondary | ICD-10-CM

## 2013-09-17 DIAGNOSIS — M5481 Occipital neuralgia: Secondary | ICD-10-CM

## 2013-09-17 MED ORDER — BUPIVACAINE HCL 0.25 % IJ SOLN
3.0000 mL | Freq: Once | INTRAMUSCULAR | Status: AC
  Administered 2013-09-17: 3 mL

## 2013-09-17 MED ORDER — TRIAMCINOLONE ACETONIDE 40 MG/ML IJ SUSP
40.0000 mg | Freq: Once | INTRAMUSCULAR | Status: AC
  Administered 2013-09-17: 40 mg via INTRAMUSCULAR

## 2013-09-17 NOTE — Procedures (Signed)
Procedure: Occipital nerve block  Procedure risks, benefits and alternative treatments explained to patient and they agree to proceed.   A concentration of 0.25% bupivacaine (3 ml) was mixed with 40 mg of Kenalog (1 ml). A 27 gauge needle was used for injection. The region of the right and left greater occipital nerves and lesser occipital nerves was located by palpation. The area was prepped with alcohol. A total of 1 cc of the above mixture was injected to each site without difficulty. The patient tolerated the procedure well.    Ludene Stokke K. Posey Pronto, DO

## 2013-09-17 NOTE — Progress Notes (Signed)
Patient here for GON and LON bloc (see procedure note).  This is his first injection.  Risks and benefits discussed.    Donika K. Posey Pronto, DO

## 2013-09-23 ENCOUNTER — Telehealth: Payer: Self-pay | Admitting: *Deleted

## 2013-09-23 NOTE — Telephone Encounter (Signed)
We discussed risks and benefits prior to procedure.  Side effects are minimal and include numbness, rare skin depigmentation, and rare hair loss.  I doubt he will experience any of these.  Addisynn Vassell K. Posey Pronto, DO

## 2013-09-23 NOTE — Telephone Encounter (Signed)
Responded to Richard Mahoney in reference to his questionnaire per Dr. Posey Pronto he was very pleased with the results of his nerve block he rom it did however express his concern with possible side affect from it. He states that he has not had any but just wants to know what to expect.

## 2013-10-02 ENCOUNTER — Encounter: Payer: Self-pay | Admitting: Cardiovascular Disease

## 2013-10-07 ENCOUNTER — Encounter: Payer: Self-pay | Admitting: Neurology

## 2013-10-18 ENCOUNTER — Encounter: Payer: Self-pay | Admitting: Neurology

## 2013-11-04 ENCOUNTER — Encounter: Payer: Self-pay | Admitting: Neurology

## 2013-11-04 ENCOUNTER — Telehealth: Payer: Self-pay | Admitting: *Deleted

## 2013-11-04 NOTE — Telephone Encounter (Signed)
Noted, thanks!

## 2013-11-04 NOTE — Telephone Encounter (Signed)
I called [facility] back and they said patient needed refill on neurontin.  Your last email with him said that you were going to increase his to 2 qam and 2 qhs.  I called in rx for #[phone] with 6 refills.  a

## 2013-11-04 NOTE — Telephone Encounter (Signed)
[facility] called about gabapentin.  [phone]

## 2013-11-11 ENCOUNTER — Encounter: Payer: Self-pay | Admitting: Neurology

## 2013-11-11 ENCOUNTER — Ambulatory Visit (INDEPENDENT_AMBULATORY_CARE_PROVIDER_SITE_OTHER): Payer: PRIVATE HEALTH INSURANCE | Admitting: Neurology

## 2013-11-11 VITALS — BP 116/72 | HR 64 | Temp 98.1°F | Ht 72.0 in | Wt 192.7 lb

## 2013-11-11 DIAGNOSIS — M531 Cervicobrachial syndrome: Secondary | ICD-10-CM

## 2013-11-11 DIAGNOSIS — R51 Headache: Secondary | ICD-10-CM | POA: Diagnosis not present

## 2013-11-11 DIAGNOSIS — M5481 Occipital neuralgia: Secondary | ICD-10-CM

## 2013-11-11 MED ORDER — GABAPENTIN 300 MG PO CAPS: ORAL_CAPSULE | ORAL | Status: DC

## 2013-11-11 NOTE — Progress Notes (Signed)
NEUROLOGY FOLLOW UP OFFICE NOTE  KHA HARI [id number]  HISTORY OF PRESENT ILLNESS: I had the pleasure of seeing Richard Mahoney in follow-up in the neurology clinic on 11/11/2013.  The patient was last seen 2 months ago for daily headaches suggestive of occipital neuralgia.  He had occipital nerve blocks done by Dr. Posey Pronto, and reports that for a few weeks he felt much better, then the pain started coming back.  He has been uptitrating gabapentin, currently on 629m in AM, 9067min PM.  He does note that the headaches have improved but not totally where he wants to be.  He is now able to go out and do outdoor activities. On his previous visits, he had pain turning his head to either side, and reports that he can do this without difficulty now however there is still a heavy feeling in the occipital region when he moves his head.  Pain is worse in the morning, and he does note that it is better if he lies on his side. Cervical xray done last month showed disc space narrowing and ventral endplate spurring identified at the C5-6 level.   He denies any dizziness, nausea, vomiting, diplopia, focal numbness/tingling/weakness.  He has some bruises on his left hand and right periorbital region after some branches fell on him while gardening last weekend, breaking his eyeglasses in half. No loss of consciousness.  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 The pain was constant, waxing and waning in intensity from a 4 to 10/10, primarily behind both ears, and in the bilateral parieto-occipital regions. He describes a tightening sensation that when severe, feels like there is pressure behind his eyes. Pain is worse when he wakes up in the morning. Moving his head side  to side also exacerbates the pain. There is associated scalp soreness and tenderness to palpation. Mild sensitivity to sounds. There is no associated nausea, vomiting, photophobia, focal numbness/tingling/weakness. He had tried Ibuprofen 80011mID and a dose of hydrocodone which were ineffective. Hydrocodone caused disorientation.   Labs reviewed from January 2015, CBC, CMP normal, ESR 11. He had an [facility] reporting scattered punctate T2 and FLAIR signal within the white matter, consistent with mild chronic small vessel changes. No acute changes. Films unavailable for review.  PAST MEDICAL HISTORY: Past Medical History  Diagnosis Date  . Nephrolithiasis   . Coronary artery disease     a. ETT 9/13: + ischemic ECG changes;  b. ETT-MV 9/13: EF 62%;  + ischemic EKG changes and + inf ischemia;  c. LHC 9/13: pLAD 30%, mLAD 70-80%, oDx 40-50% ==> FFR hemodynamically significant stenosis in mLAD ==> PCI: Xience Xpedition DES to mLAD; Cath 06/01/12   single vessel CAD w/ patent mid LAD stent and normal LV systolic function  . GERD (gastroesophageal reflux disease)   . Hyperlipidemia LDL goal < 70   . Fibromyalgia     MEDICATIONS: Current Outpatient Prescriptions on File Prior to Visit  Medication Sig Dispense Refill  . aspirin EC 81 MG tablet Take 81 mg by mouth at bedtime.      . clopidogrel (PLAVIX) 75 MG tablet Take 1 tablet (75 mg total) by mouth daily.  30 tablet  11  . nitroGLYCERIN (  NITROSTAT) 0.4 MG SL tablet Place 1 tablet (0.4 mg total) under the tongue every 5 (five) minutes as needed for chest pain.  25 tablet  6  . pantoprazole (PROTONIX) 40 MG tablet TAKE 1 TABLET EVERY DAY  30 tablet  2   No current facility-administered medications on file prior to visit.    ALLERGIES: Allergies  Allergen Reactions  . Sulfa Antibiotics Rash    rash    FAMILY HISTORY: Family History  Problem Relation Age of Onset  . Diabetes Mother   .  Heart attack Mother 58  . Diabetes Father   . Heart attack Father 66  . Diabetes Sister   . Arthritis Brother     SOCIAL HISTORY: History   Social History  . Marital Status: Married    Spouse Name: N/A    Number of Children: 2  . Years of Education: N/A   Occupational History  . Retired    Social History Main Topics  . Smoking status: Never Smoker   . Smokeless tobacco: Never Used  . Alcohol Use: No  . Drug Use: No  . Sexual Activity: Not Currently   Other Topics Concern  . Not on file   Social History Narrative  . No narrative on file    REVIEW OF SYSTEMS: Constitutional: No fevers, chills, or sweats, no generalized fatigue, change in appetite Eyes: No visual changes, double vision, eye pain Ear, nose and throat: No hearing loss, ear pain, nasal congestion, sore throat Cardiovascular: No chest pain, palpitations Respiratory:  No shortness of breath at rest or with exertion, wheezes GastrointestinaI: No nausea, vomiting, diarrhea, abdominal pain, fecal incontinence Genitourinary:  No dysuria, urinary retention or frequency Musculoskeletal:  As above Integumentary: No rash, pruritus Neurological: as above Psychiatric: No depression, insomnia, anxiety Endocrine: No palpitations, fatigue, diaphoresis, mood swings, change in appetite, change in weight, increased thirst Hematologic/Lymphatic:  No anemia, purpura, petechiae. Allergic/Immunologic: no itchy/runny eyes, nasal congestion, recent allergic reactions, rashes  PHYSICAL EXAM: Filed Vitals:   11/11/13 1124  BP: 116/72  Pulse: 64  Temp: 98.1 F (36.7 C)   General: No acute distress Head:  Normocephalic/atraumatic. Slight tenderness to palpation over the bilateral occipital regions. Neck: supple, no paraspinal tenderness, full range of motion Heart:  Regular rate and rhythm Lungs:  Clear to auscultation bilaterally Back: No paraspinal tenderness Skin/Extremities: No rash, no edema Neurological Exam:  alert and oriented to person, place, and time. No aphasia or dysarthria. Fund of knowledge is appropriate.  Recent and remote memory are intact.  Attention and concentration are normal.    Able to name objects and repeat phrases. Cranial nerves: Pupils equal, round, reactive to light.  Fundoscopic exam unremarkable, no papilledema. Extraocular movements intact with no nystagmus. Visual fields full. Facial sensation intact. No facial asymmetry. Tongue, uvula, palate midline.  Motor: Bulk and tone normal, muscle strength 5/5 throughout with no pronator drift.  Sensation to light touch, temperature and vibration intact.  No extinction to double simultaneous stimulation.  Deep tendon reflexes 2+ throughout, toes downgoing.  Finger to nose testing intact.  Gait narrow-based and steady, able to tandem walk adequately.  Romberg negative.  IMPRESSION: This is a pleasant 68 yo RH man with a history of new onset headaches that were constant since October 2014, suggestive of occipital neuralgia versus cervicogenic headaches.  He has had good response to gabapentin, increase dose to 939m BID.  Side effects were discussed.  He did note improvement after occipital nerve block, and this may be  considered again in the future if indicated.  He may take Aleve prn for more intense headaches, but knows to minimize to 2-3/week to avoid rebound headaches.  He would like to hold off on physical therapy for now.  He will follow-up in 4 months  Thank you for allowing me to participate in his care.  Please do not hesitate to call for any questions or concerns.  The duration of this appointment visit was 15 minutes of face-to-face time with the patient.  Greater than 50% of this time was spent in counseling, explanation of diagnosis, planning of further management, and coordination of care.   Ellouise Newer, M.D.

## 2013-11-11 NOTE — Patient Instructions (Signed)
1. Increase Gabapentin 300mg : Take 3 caps twice a day 2. May take Aleve as needed, minimize to 2-3 times a week 3. Call office for any problems

## 2013-11-12 ENCOUNTER — Ambulatory Visit: Payer: PRIVATE HEALTH INSURANCE | Admitting: Neurology

## 2013-11-22 ENCOUNTER — Other Ambulatory Visit (HOSPITAL_COMMUNITY): Payer: Self-pay | Admitting: Internal Medicine

## 2013-12-09 ENCOUNTER — Ambulatory Visit (INDEPENDENT_AMBULATORY_CARE_PROVIDER_SITE_OTHER): Payer: PRIVATE HEALTH INSURANCE | Admitting: Cardiovascular Disease

## 2013-12-09 ENCOUNTER — Encounter: Payer: Self-pay | Admitting: Cardiovascular Disease

## 2013-12-09 VITALS — BP 120/84 | HR 59 | Ht 72.0 in | Wt 191.0 lb

## 2013-12-09 DIAGNOSIS — E785 Hyperlipidemia, unspecified: Secondary | ICD-10-CM

## 2013-12-09 DIAGNOSIS — I2510 Atherosclerotic heart disease of native coronary artery without angina pectoris: Secondary | ICD-10-CM

## 2013-12-09 MED ORDER — NITROGLYCERIN 0.4 MG SL SUBL: 0.4000 mg | SUBLINGUAL_TABLET | SUBLINGUAL | Status: DC | PRN

## 2013-12-09 MED ORDER — ATORVASTATIN CALCIUM 20 MG PO TABS: 20.0000 mg | ORAL_TABLET | ORAL | Status: DC

## 2013-12-09 NOTE — Progress Notes (Signed)
History of Present Illness: 68 yo male with history of CAD, kidney stones, GERD here today for cardiac follow up. Initially seen in 2013 after abnormal stress test. Stress myoview 04/02/12 with ST depression at peak exercise and inferior wall defect concerning for ischemia. Cardiac cath on 04/06/12 with moderately severe mid LAD lesion involving the diagonal branch. He was brought back for PCI on 04/11/12 and FFR of the LAD was 0.77. A Xience Expedition (2.75 mm x 23 mm) drug eluting stent was placed in the mid LAD and angioplasty was performed on the ostium of the diagonal. Recurrent chest pain November 2013 with cardiac cath 06/01/12 demonstrating stable disease with patency of LAD stent. Statin stopped due to myalgias.   He is here today for cardiac follow up. He has been doing well. No chest pain or SOB. He has been active.      Primary Care Physician: Phoebe Sharps  Last Lipid Profile:Lipid Panel     Component Value Date/Time   CHOL 108 12/06/2012 0942   TRIG 45.0 12/06/2012 0942   HDL 38.80* 12/06/2012 0942   CHOLHDL 3 12/06/2012 0942   VLDL 9.0 12/06/2012 0942   LDLCALC 60 12/06/2012 0942    Past Medical History  Diagnosis Date  . Nephrolithiasis   . Coronary artery disease     a. ETT 9/13: + ischemic ECG changes;  b. ETT-MV 9/13: EF 62%;  + ischemic EKG changes and + inf ischemia;  c. LHC 9/13: pLAD 30%, mLAD 70-80%, oDx 40-50% ==> FFR hemodynamically significant stenosis in mLAD ==> PCI: Xience Xpedition DES to mLAD; Cath 06/01/12   single vessel CAD w/ patent mid LAD stent and normal LV systolic function  . GERD (gastroesophageal reflux disease)   . Hyperlipidemia LDL goal < 70   . Fibromyalgia     Past Surgical History  Procedure Laterality Date  . Inguinal hernia repair  1990's    right  . Knee arthroscopy  1990's    right  . Cataract extraction w/ intraocular lens implant  08/2011    right  . Cardiac catheterization  04/06/2012; 06/01/2012  . Coronary angioplasty with  stent placement  04/11/2012    DES LAD  . Cystoscopy w/ stone manipulation  2009    Current Outpatient Prescriptions  Medication Sig Dispense Refill  . aspirin EC 81 MG tablet Take 81 mg by mouth at bedtime.      . clopidogrel (PLAVIX) 75 MG tablet Take 1 tablet (75 mg total) by mouth daily.  30 tablet  11  . gabapentin (NEURONTIN) 300 MG capsule Take 300 mg by mouth 2 (two) times daily. 3 caps nightly and 2 caps in the a.m. twice a day 5 total      . nitroGLYCERIN (NITROSTAT) 0.4 MG SL tablet Place 1 tablet (0.4 mg total) under the tongue every 5 (five) minutes as needed for chest pain.  25 tablet  6  . pantoprazole (PROTONIX) 40 MG tablet TAKE 1 TABLET EVERY DAY  30 tablet  3   No current facility-administered medications for this visit.    Allergies  Allergen Reactions  . Sulfa Antibiotics Rash    rash    History   Social History  . Marital Status: Married    Spouse Name: N/A    Number of Children: 2  . Years of Education: N/A   Occupational History  . Retired    Social History Main Topics  . Smoking status: Never Smoker   . Smokeless tobacco:  Never Used  . Alcohol Use: No  . Drug Use: No  . Sexual Activity: Not Currently   Other Topics Concern  . Not on file   Social History Narrative  . No narrative on file    Family History  Problem Relation Age of Onset  . Diabetes Mother   . Heart attack Mother 103  . Diabetes Father   . Heart attack Father 69  . Diabetes Sister   . Arthritis Brother     Review of Systems:  As stated in the HPI and otherwise negative.   BP 120/84  Pulse 59  Ht 6' (1.829 m)  Wt 191 lb (86.637 kg)  BMI 25.90 kg/m2  Physical Examination: General: Well developed, well nourished, NAD HEENT: OP clear, mucus membranes moist SKIN: warm, dry. No rashes. Neuro: No focal deficits Musculoskeletal: Muscle strength 5/5 all ext Psychiatric: Mood and affect normal Neck: No JVD, no carotid bruits, no thyromegaly, no  lymphadenopathy. Lungs:Clear bilaterally, no wheezes, rhonci, crackles Cardiovascular: Regular rate and rhythm. No murmurs, gallops or rubs. Abdomen:Soft. Bowel sounds present. Non-tender.  Extremities: No lower extremity edema. Pulses are 2 + in the bilateral DP/PT.  Cardiac cath 06/01/12:  Left main: No obstructive disease noted.  Left Anterior Descending Artery: Moderate caliber vessel that courses to the apex. The proximal vessel has a 30% stenosis. The mid vessel has a patent stented segment with no evidence of restenosis. The distal LAD has no obstructive disease. The diagonal branch is covered by the LAD stent and has a 10% ostial stenosis. There is excellent flow down the diagonal branch.  Intermediate Artery: Small caliber vessel with 30% stenosis.  Circumflex Artery: Large caliber, co-dominant vessel with no obstructive disease noted. There are three small caliber obtuse marginal branches that have no obstructive disease noted.  Right Coronary Artery: Small caliber, co-dominant vessel with mild plaque in the proximal and mid segments.  Left Ventricular Angiogram: LVEF=60-65%   Assessment and Plan:   1. CAD: Stable. Continue ASA and Plavix. No beta blocker with bradycardia. Xanax prn anxiety. Will start atorvastatin 20 mg po every other day. Check lipids and LFTs in 12 weeks. Refill NTG.   2.GERD: Continue PPI

## 2013-12-09 NOTE — Patient Instructions (Addendum)
Your physician wants you to follow-up in:  6 months. You will receive a reminder letter in the mail two months in advance. If you don't receive a letter, please call our office to schedule the follow-up appointment.  Your physician recommends that you return for fasting lab work in:  12 weeks. This is the week of March 03, 2014--(lipid and liver profiles)  Your physician has recommended you make the following change in your medication:  Start Atorvastatin 20 mg by mouth every other day.

## 2013-12-10 ENCOUNTER — Encounter: Payer: Self-pay | Admitting: Neurology

## 2013-12-10 ENCOUNTER — Ambulatory Visit: Payer: PRIVATE HEALTH INSURANCE | Admitting: Neurology

## 2013-12-12 ENCOUNTER — Encounter: Payer: Self-pay | Admitting: Neurology

## 2013-12-13 ENCOUNTER — Encounter: Payer: Self-pay | Admitting: Neurology

## 2013-12-13 ENCOUNTER — Ambulatory Visit (INDEPENDENT_AMBULATORY_CARE_PROVIDER_SITE_OTHER): Payer: PRIVATE HEALTH INSURANCE | Admitting: Neurology

## 2013-12-13 VITALS — BP 118/86 | HR 62 | Ht 71.65 in | Wt 192.6 lb

## 2013-12-13 DIAGNOSIS — M531 Cervicobrachial syndrome: Secondary | ICD-10-CM

## 2013-12-13 DIAGNOSIS — M5481 Occipital neuralgia: Secondary | ICD-10-CM

## 2013-12-13 DIAGNOSIS — R51 Headache: Secondary | ICD-10-CM

## 2013-12-13 DIAGNOSIS — M542 Cervicalgia: Secondary | ICD-10-CM

## 2013-12-13 MED ORDER — BUPIVACAINE HCL 0.25 % IJ SOLN
3.0000 mL | Freq: Once | INTRAMUSCULAR | Status: AC
  Administered 2013-12-13: 3 mL

## 2013-12-13 MED ORDER — TRIAMCINOLONE ACETONIDE 40 MG/ML IJ SUSP
40.0000 mg | Freq: Once | INTRAMUSCULAR | Status: AC
  Administered 2013-12-13: 40 mg via INTRAMUSCULAR

## 2013-12-13 NOTE — Procedures (Signed)
Procedure: Occipital nerve block  Procedure risks, benefits and alternative treatments explained to patient and they agree to proceed.   A concentration of 0.25% bupivacaine (4.75 ml) was mixed with 40 mg of Kenalog (1.5 ml). A 27 gauge needle was used for injection. The region of the right and left greater occipital nerve was located by palpation. The area was prepped with alcohol. A total of 2 cc of the above mixture was injected without difficulty to each site.  Similarly, the region of the right and left lessor occipital nerve was located by palpation, the area was prepped with alcohol, and a total of 1cc of the above mixture was injected to each site without difficulty. The patient tolerated the procedure well.    Richard K. Posey Pronto, DO

## 2013-12-13 NOTE — Progress Notes (Signed)
Patient here for GON and LON blocks (see procedure note).  Last performed on 09/17/2013 with significant relief of pain for several weeks.  This will be his second injection.  Risks and benefits discussed.  Donika K. Posey Pronto, DO

## 2013-12-23 ENCOUNTER — Encounter: Payer: Self-pay | Admitting: Internal Medicine

## 2014-01-10 ENCOUNTER — Encounter: Payer: Self-pay | Admitting: Neurology

## 2014-01-15 ENCOUNTER — Encounter: Payer: Self-pay | Admitting: Neurology

## 2014-01-16 ENCOUNTER — Telehealth: Payer: Self-pay | Admitting: Neurology

## 2014-01-16 NOTE — Telephone Encounter (Signed)
Unable to reach patient will try again later 

## 2014-01-16 NOTE — Telephone Encounter (Signed)
Pt awaiting response from MyChart message sent on 01/10/14. Please review message and respond to patient.

## 2014-01-17 ENCOUNTER — Encounter: Payer: Self-pay | Admitting: Neurology

## 2014-01-17 NOTE — Telephone Encounter (Signed)
Patient notified and follow up scheduled

## 2014-01-22 ENCOUNTER — Other Ambulatory Visit: Payer: Self-pay | Admitting: *Deleted

## 2014-01-22 ENCOUNTER — Encounter: Payer: Self-pay | Admitting: Neurology

## 2014-01-22 ENCOUNTER — Ambulatory Visit (INDEPENDENT_AMBULATORY_CARE_PROVIDER_SITE_OTHER): Payer: PRIVATE HEALTH INSURANCE | Admitting: Neurology

## 2014-01-22 VITALS — BP 116/78 | HR 61 | Ht 77.0 in | Wt 190.9 lb

## 2014-01-22 DIAGNOSIS — M542 Cervicalgia: Secondary | ICD-10-CM | POA: Diagnosis not present

## 2014-01-22 DIAGNOSIS — M5481 Occipital neuralgia: Secondary | ICD-10-CM

## 2014-01-22 DIAGNOSIS — M531 Cervicobrachial syndrome: Secondary | ICD-10-CM

## 2014-01-22 DIAGNOSIS — I2510 Atherosclerotic heart disease of native coronary artery without angina pectoris: Secondary | ICD-10-CM

## 2014-01-22 MED ORDER — PREGABALIN 75 MG PO CAPS: 75.0000 mg | ORAL_CAPSULE | Freq: Two times a day (BID) | ORAL | Status: DC

## 2014-01-22 NOTE — Patient Instructions (Addendum)
1. Start Lyrica 75mg : Take 1 cap at bedtime for 4 days, then increase to 1 cap twice a day 2. Start reducing Gabapentin 300mg : Take 2 caps in AM, stop evening dose for 4 days, then stop morning dose. 3. Referral will be sent to [facility]

## 2014-01-22 NOTE — Progress Notes (Signed)
NEUROLOGY FOLLOW UP OFFICE NOTE  Richard Mahoney [id number]  HISTORY OF PRESENT ILLNESS: I had the pleasure of seeing Jairus Tonne in follow-up in the neurology clinic on 01/22/2014.  The patient was last seen 2 months ago for occipital neuralgia vs cervicogenic headaches and presents today with continued pain.  He called our office last month and underwent another round of occipital nerve blocks of the GON and LON with Dr. Posey Pronto.  He had significant relief of pain for several weeks after the first set, however this time only had relief for several days.  He has started physical therapy, and does feel that his neck is better, but the headaches seem worse.  He has tenderness to palpation over the occipital regions, being very careful when using his comb. He needs to wake up early when the back of his head becomes very painful after sleeping on his back.  He cannot sleep on his stomach, he has tried various towels/pillows in the back of his neck without effect.  If he extends his head, he gets a sharp pain shooting up the back of his head.  If severe, he may have some double vision.  He denies any nausea, vomiting, photo/phonophobia, focal numbness/tingling/weakness.  He had side effects on nortriptyline.  He noticed a good response to gabapentin, however asked to reduce dose due to daytime drowsiness.  He was up to 968m BID.  When he reduced the dose to 6049mBID, drowsiness improved however the headaches significantly worsened, sometimes radiating behind his eyes.  He also reported that with PT when his head is pulled up, he feels instant relief of his headaches, but then recurs after a few hours.  He is getting frustrated with continued pain.  He has noticed occasional word-finding difficulties, no confusion.  He has noticed some worsening of balance, no falls.  Last night he woke up in the middle of the night to go to the bathroom, then went to the closet and turned the light on there.  He is  getting depressed due to continued pain because he likes to usually be active.  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 The pain was constant, waxing and waning in intensity from a 4 to 10/10, primarily behind both ears, and in the bilateral parieto-occipital regions. He describes a tightening sensation that when severe, feels like there is pressure behind his eyes. Pain is worse when he wakes up in the morning. Moving his head side to side also exacerbates the pain. There is associated scalp soreness and tenderness to palpation. Mild sensitivity to sounds. There is no associated nausea, vomiting, photophobia, focal numbness/tingling/weakness. He had tried Ibuprofen 80071mID and a dose of hydrocodone which were ineffective. Hydrocodone caused disorientation.   Labs reviewed from January 2015, CBC, CMP normal, ESR 11. He had an [facility] reporting scattered punctate T2 and FLAIR signal within the white matter, consistent with mild chronic small vessel changes. No acute changes. Films unavailable for review. Cervical xray done last month showed disc space narrowing and ventral endplate spurring identified at the C5-6 level.    PAST MEDICAL HISTORY: Past Medical History  Diagnosis Date  . Nephrolithiasis   . Coronary artery disease  a. ETT 9/13: + ischemic ECG changes;  b. ETT-MV 9/13: EF 62%;  + ischemic EKG changes and + inf ischemia;  c. LHC 9/13: pLAD 30%, mLAD 70-80%, oDx 40-50% ==> FFR hemodynamically significant stenosis in mLAD ==> PCI: Xience Xpedition DES to mLAD; Cath 06/01/12   single vessel CAD w/ patent mid LAD stent and normal LV systolic function  . GERD (gastroesophageal reflux disease)   .  Hyperlipidemia LDL goal < 70   . Fibromyalgia     MEDICATIONS: Current Outpatient Prescriptions on File Prior to Visit  Medication Sig Dispense Refill  . aspirin EC 81 MG tablet Take 81 mg by mouth at bedtime.      Marland Kitchen atorvastatin (LIPITOR) 20 MG tablet Take 1 tablet (20 mg total) by mouth every other day.  45 tablet  3  . clopidogrel (PLAVIX) 75 MG tablet Take 1 tablet (75 mg total) by mouth daily.  30 tablet  11  . gabapentin (NEURONTIN) 300 MG capsule Take 300 mg by mouth 2 (two) times daily. 2 caps nightly and 2 caps in the a.m. twice a day 5 total      . nitroGLYCERIN (NITROSTAT) 0.4 MG SL tablet Place 1 tablet (0.4 mg total) under the tongue every 5 (five) minutes as needed for chest pain.  25 tablet  6  . pantoprazole (PROTONIX) 40 MG tablet TAKE 1 TABLET EVERY DAY  30 tablet  3   No current facility-administered medications on file prior to visit.    ALLERGIES: Allergies  Allergen Reactions  . Sulfa Antibiotics Rash    rash    FAMILY HISTORY: Family History  Problem Relation Age of Onset  . Diabetes Mother   . Heart attack Mother 15  . Diabetes Father   . Heart attack Father 63  . Diabetes Sister   . Arthritis Brother     SOCIAL HISTORY: History   Social History  . Marital Status: Married    Spouse Name: N/A    Number of Children: 2  . Years of Education: N/A   Occupational History  . Retired    Social History Main Topics  . Smoking status: Never Smoker   . Smokeless tobacco: Never Used  . Alcohol Use: No  . Drug Use: No  . Sexual Activity: Not Currently   Other Topics Concern  . Not on file   Social History Narrative  . No narrative on file    REVIEW OF SYSTEMS: Constitutional: No fevers, chills, or sweats, no generalized fatigue, change in appetite Eyes: as above Ear, nose and throat: No hearing loss, ear pain, nasal congestion, sore throat Cardiovascular: No chest pain, palpitations Respiratory:  No shortness of breath at rest or with  exertion, wheezes GastrointestinaI: No nausea, vomiting, diarrhea, abdominal pain, fecal incontinence Genitourinary:  No dysuria, urinary retention or frequency Musculoskeletal:  No neck pain, back pain Integumentary: No rash, pruritus, skin lesions Neurological: as above Psychiatric: No depression, insomnia, anxiety Endocrine: No palpitations, fatigue, diaphoresis, mood swings, change in appetite, change in weight, increased thirst Hematologic/Lymphatic:  No anemia, purpura, petechiae. Allergic/Immunologic: no itchy/runny eyes, nasal congestion, recent allergic reactions, rashes  PHYSICAL EXAM: Filed Vitals:   01/22/14 1137  BP: 116/78  Pulse: 61   General: No acute distress Head:  Normocephalic/atraumatic, tenderness to palpation over the left>right occipital region; no temporal tenderness Neck: supple, no paraspinal tenderness, full range of motion Heart:  Regular rate and rhythm Lungs:  Clear to auscultation bilaterally Back: No paraspinal tenderness Skin/Extremities: No rash, no  edema Neurological Exam: alert and oriented to person, place, and time. No aphasia or dysarthria. Fund of knowledge is appropriate.  Recent and remote memory are intact.  Attention and concentration are normal.    Able to name objects and repeat phrases. Cranial nerves: Pupils equal, round, reactive to light.  Fundoscopic exam unremarkable, no papilledema. Extraocular movements intact with no nystagmus. Visual fields full. Facial sensation intact. No facial asymmetry. Tongue, uvula, palate midline.  Motor: Bulk and tone normal, muscle strength 5/5 throughout with no pronator drift.  Sensation to light touch, temperature and vibration intact.  No extinction to double simultaneous stimulation.  Deep tendon reflexes 2+ throughout, toes downgoing.  Finger to nose testing intact.  Gait narrow-based and steady, able to tandem walk adequately.  Romberg negative.  IMPRESSION: This is a pleasant 68 yo RH man with a  history of new onset headaches that were constant since October 2014, suggestive of occipital neuralgia versus cervicogenic headaches. MRI brain unremarkable.  He has had good response to gabapentin but side effects on higher dose.  He will transition to Lyrica 54m BID and assess response.  He tried nortriptyline which caused side effects.  He has had 2 rounds of occipital nerve blocks with transient relief.  [facility] [place] [facility] [place] [facility] [place] [facility].  He is interested in acupuncture, which may help.  He will continue PT and follow-up in 3 months.    Thank you for allowing me to participate in his care.  Please do not hesitate to call for any questions or concerns.  The duration of this appointment visit was 25 minutes of face-to-face time with the patient.  Greater than 50% of this time was spent in counseling, explanation of diagnosis, planning of further management, and coordination of care.   KEllouise Newer M.D.   CC: Dr. SLeanne Chang

## 2014-01-22 NOTE — Addendum Note (Signed)
Addended by: Ella Jubilee on: 01/22/2014 03:33 PM   Modules accepted: Orders

## 2014-02-17 ENCOUNTER — Encounter: Payer: Self-pay | Admitting: Neurology

## 2014-02-17 ENCOUNTER — Telehealth: Payer: Self-pay | Admitting: Internal Medicine

## 2014-02-17 NOTE — Telephone Encounter (Signed)
Pt requesting to establish care with Dr. Elease Hashimoto due to Dr. Leanne Chang leaving the practice.  Pt was offered an appt to establish with other providers in office who are accepting new patients, pt declined.  Please advise if you can accept this patient or not.  I did explain in detail to pt that you have closed your practice.

## 2014-02-18 NOTE — Telephone Encounter (Signed)
Let's encourage these folks to try one of our providers who do not have full panels. I cannot take on any new pts at this time

## 2014-02-19 ENCOUNTER — Telehealth: Payer: Self-pay | Admitting: Internal Medicine

## 2014-02-19 NOTE — Telephone Encounter (Addendum)
Pt aware and scheduled w/ [facility].

## 2014-02-19 NOTE — Telephone Encounter (Signed)
Per Rodman Key ok to schedule in appropriate slot.

## 2014-02-19 NOTE — Telephone Encounter (Signed)
Pt needs a cpe before end of oct. Due to his insuraance requirement. Pt would like to know if [facility] will do for him? Pt will schedule appt later on in the year w/ [facility].

## 2014-02-20 NOTE — Telephone Encounter (Signed)
appt scheduled

## 2014-02-21 DIAGNOSIS — G4452 New daily persistent headache (NDPH): Secondary | ICD-10-CM | POA: Diagnosis not present

## 2014-02-24 DIAGNOSIS — G518 Other disorders of facial nerve: Secondary | ICD-10-CM | POA: Diagnosis not present

## 2014-02-24 DIAGNOSIS — R51 Headache: Secondary | ICD-10-CM | POA: Diagnosis not present

## 2014-02-24 DIAGNOSIS — M542 Cervicalgia: Secondary | ICD-10-CM | POA: Diagnosis not present

## 2014-02-24 DIAGNOSIS — G4452 New daily persistent headache (NDPH): Secondary | ICD-10-CM | POA: Diagnosis not present

## 2014-02-24 DIAGNOSIS — IMO0001 Reserved for inherently not codable concepts without codable children: Secondary | ICD-10-CM | POA: Diagnosis not present

## 2014-03-04 ENCOUNTER — Other Ambulatory Visit (INDEPENDENT_AMBULATORY_CARE_PROVIDER_SITE_OTHER): Payer: PRIVATE HEALTH INSURANCE

## 2014-03-04 DIAGNOSIS — I2510 Atherosclerotic heart disease of native coronary artery without angina pectoris: Secondary | ICD-10-CM | POA: Diagnosis not present

## 2014-03-04 LAB — HEPATIC FUNCTION PANEL
ALBUMIN: 4 g/dL (ref 3.5–5.2)
ALT: 20 U/L (ref 0–53)
AST: 22 U/L (ref 0–37)
Alkaline Phosphatase: 55 U/L (ref 39–117)
BILIRUBIN TOTAL: 0.9 mg/dL (ref 0.2–1.2)
Bilirubin, Direct: 0 mg/dL (ref 0.0–0.3)
Total Protein: 7.1 g/dL (ref 6.0–8.3)

## 2014-03-04 LAB — LIPID PANEL
Cholesterol: 138 mg/dL (ref 0–200)
HDL: 39.6 mg/dL (ref >39.00)
LDL Cholesterol: 70 mg/dL (ref 0–99)
NONHDL: 98.4
Total CHOL/HDL Ratio: 3
Triglycerides: 141 mg/dL (ref 0.0–149.0)
VLDL: 28.2 mg/dL (ref 0.0–40.0)

## 2014-03-10 DIAGNOSIS — G4452 New daily persistent headache (NDPH): Secondary | ICD-10-CM | POA: Diagnosis not present

## 2014-03-10 DIAGNOSIS — G518 Other disorders of facial nerve: Secondary | ICD-10-CM | POA: Diagnosis not present

## 2014-03-10 DIAGNOSIS — IMO0001 Reserved for inherently not codable concepts without codable children: Secondary | ICD-10-CM | POA: Diagnosis not present

## 2014-03-10 DIAGNOSIS — M542 Cervicalgia: Secondary | ICD-10-CM | POA: Diagnosis not present

## 2014-03-10 DIAGNOSIS — R51 Headache: Secondary | ICD-10-CM | POA: Diagnosis not present

## 2014-03-24 ENCOUNTER — Other Ambulatory Visit (HOSPITAL_COMMUNITY): Payer: Self-pay | Admitting: Internal Medicine

## 2014-03-24 DIAGNOSIS — R51 Headache: Secondary | ICD-10-CM | POA: Diagnosis not present

## 2014-03-24 DIAGNOSIS — G518 Other disorders of facial nerve: Secondary | ICD-10-CM | POA: Diagnosis not present

## 2014-03-24 DIAGNOSIS — G4452 New daily persistent headache (NDPH): Secondary | ICD-10-CM | POA: Diagnosis not present

## 2014-03-24 DIAGNOSIS — M542 Cervicalgia: Secondary | ICD-10-CM | POA: Diagnosis not present

## 2014-03-24 DIAGNOSIS — IMO0001 Reserved for inherently not codable concepts without codable children: Secondary | ICD-10-CM | POA: Diagnosis not present

## 2014-04-07 DIAGNOSIS — M542 Cervicalgia: Secondary | ICD-10-CM | POA: Diagnosis not present

## 2014-04-07 DIAGNOSIS — G518 Other disorders of facial nerve: Secondary | ICD-10-CM | POA: Diagnosis not present

## 2014-04-07 DIAGNOSIS — R51 Headache: Secondary | ICD-10-CM | POA: Diagnosis not present

## 2014-04-07 DIAGNOSIS — G4452 New daily persistent headache (NDPH): Secondary | ICD-10-CM | POA: Diagnosis not present

## 2014-04-07 DIAGNOSIS — IMO0001 Reserved for inherently not codable concepts without codable children: Secondary | ICD-10-CM | POA: Diagnosis not present

## 2014-04-08 ENCOUNTER — Encounter: Payer: Self-pay | Admitting: Physician Assistant

## 2014-04-15 ENCOUNTER — Ambulatory Visit (INDEPENDENT_AMBULATORY_CARE_PROVIDER_SITE_OTHER): Payer: PRIVATE HEALTH INSURANCE | Admitting: Physician Assistant

## 2014-04-15 ENCOUNTER — Encounter: Payer: Self-pay | Admitting: Physician Assistant

## 2014-04-15 ENCOUNTER — Other Ambulatory Visit: Payer: Self-pay | Admitting: Physician Assistant

## 2014-04-15 VITALS — BP 110/72 | HR 66 | Temp 98.4°F | Resp 18 | Ht 72.0 in | Wt 189.9 lb

## 2014-04-15 DIAGNOSIS — Z23 Encounter for immunization: Secondary | ICD-10-CM

## 2014-04-15 DIAGNOSIS — Z0000 Encounter for general adult medical examination without abnormal findings: Secondary | ICD-10-CM

## 2014-04-15 LAB — CBC WITH DIFFERENTIAL/PLATELET
Basophils Absolute: 0 10*3/uL (ref 0.0–0.1)
Basophils Relative: 0.6 % (ref 0.0–3.0)
EOS PCT: 2.7 % (ref 0.0–5.0)
Eosinophils Absolute: 0.1 10*3/uL (ref 0.0–0.7)
HEMATOCRIT: 43.1 % (ref 39.0–52.0)
Hemoglobin: 14.7 g/dL (ref 13.0–17.0)
LYMPHS ABS: 1.8 10*3/uL (ref 0.7–4.0)
Lymphocytes Relative: 33.6 % (ref 12.0–46.0)
MCHC: 34 g/dL (ref 30.0–36.0)
MCV: 91.2 fl (ref 78.0–100.0)
MONO ABS: 0.6 10*3/uL (ref 0.1–1.0)
Monocytes Relative: 11.5 % (ref 3.0–12.0)
NEUTROS ABS: 2.8 10*3/uL (ref 1.4–7.7)
Neutrophils Relative %: 51.6 % (ref 43.0–77.0)
Platelets: 229 10*3/uL (ref 150.0–400.0)
RBC: 4.73 Mil/uL (ref 4.22–5.81)
RDW: 12.7 % (ref 11.5–15.5)
WBC: 5.4 10*3/uL (ref 4.0–10.5)

## 2014-04-15 LAB — POCT URINALYSIS DIPSTICK
Blood, UA: NEGATIVE
Glucose, UA: NEGATIVE
LEUKOCYTES UA: NEGATIVE
Nitrite, UA: NEGATIVE
Protein, UA: NEGATIVE
Spec Grav, UA: 1.02
Urobilinogen, UA: 0.2
pH, UA: 5.5

## 2014-04-15 LAB — PSA: PSA: 5.74 ng/mL — ABNORMAL HIGH (ref 0.10–4.00)

## 2014-04-15 LAB — BASIC METABOLIC PANEL
BUN: 27 mg/dL — ABNORMAL HIGH (ref 6–23)
CO2: 30 meq/L (ref 19–32)
CREATININE: 1.2 mg/dL (ref 0.4–1.5)
Calcium: 9.1 mg/dL (ref 8.4–10.5)
Chloride: 101 mEq/L (ref 96–112)
GFR: 62.9 mL/min (ref >60.00)
Glucose, Bld: 93 mg/dL (ref 70–99)
Potassium: 4.8 mEq/L (ref 3.5–5.1)
SODIUM: 137 meq/L (ref 135–145)

## 2014-04-15 LAB — TSH: TSH: 2.6 u[IU]/mL (ref 0.35–4.50)

## 2014-04-15 NOTE — Progress Notes (Signed)
Pre visit review using our clinic review tool, if applicable. No additional management support is needed unless otherwise documented below in the visit note. 

## 2014-04-15 NOTE — Patient Instructions (Addendum)
We will call with your lab results when available.  Continue your current medication regimens.  Continue heart health diet and regular exercise.   If emergency symptoms discussed during visit developed, seek medical attention immediately.  Followup as needed, or for worsening or persistent symptoms despite treatment.      Health Maintenance A healthy lifestyle and preventative care can promote health and wellness.  Maintain regular health, dental, and eye exams.  Eat a healthy diet. Foods like vegetables, fruits, whole grains, low-fat dairy products, and lean protein foods contain the nutrients you need and are low in calories. Decrease your intake of foods high in solid fats, added sugars, and salt. Get information about a proper diet from your health care provider, if necessary.  Regular physical exercise is one of the most important things you can do for your health. Most adults should get at least 150 minutes of moderate-intensity exercise (any activity that increases your heart rate and causes you to sweat) each week. In addition, most adults need muscle-strengthening exercises on 2 or more days a week.   Maintain a healthy weight. The body mass index (BMI) is a screening tool to identify possible weight problems. It provides an estimate of body fat based on height and weight. Your health care provider can find your BMI and can help you achieve or maintain a healthy weight. For males 20 years and older:  A BMI below 18.5 is considered underweight.  A BMI of 18.5 to 24.9 is normal.  A BMI of 25 to 29.9 is considered overweight.  A BMI of 30 and above is considered obese.  Maintain normal blood lipids and cholesterol by exercising and minimizing your intake of saturated fat. Eat a balanced diet with plenty of fruits and vegetables. Blood tests for lipids and cholesterol should begin at age 52 and be repeated every 5 years. If your lipid or cholesterol levels are high, you are  over age 90, or you are at high risk for heart disease, you may need your cholesterol levels checked more frequently.Ongoing high lipid and cholesterol levels should be treated with medicines if diet and exercise are not working.  If you smoke, find out from your health care provider how to quit. If you do not use tobacco, do not start.  Lung cancer screening is recommended for adults aged 9-80 years who are at high risk for developing lung cancer because of a history of smoking. A yearly low-dose CT scan of the lungs is recommended for people who have at least a 30-pack-year history of smoking and are current smokers or have quit within the past 15 years. A pack year of smoking is smoking an average of 1 pack of cigarettes a day for 1 year (for example, a 30-pack-year history of smoking could mean smoking 1 pack a day for 30 years or 2 packs a day for 15 years). Yearly screening should continue until the smoker has stopped smoking for at least 15 years. Yearly screening should be stopped for people who develop a health problem that would prevent them from having lung cancer treatment.  If you choose to drink alcohol, do not have more than 2 drinks per day. One drink is considered to be 12 oz (360 mL) of beer, 5 oz (150 mL) of wine, or 1.5 oz (45 mL) of liquor.  Avoid the use of street drugs. Do not share needles with anyone. Ask for help if you need support or instructions about stopping the use of drugs.  High blood pressure causes heart disease and increases the risk of stroke. Blood pressure should be checked at least every 1-2 years. Ongoing high blood pressure should be treated with medicines if weight loss and exercise are not effective.  If you are 38-96 years old, ask your health care provider if you should take aspirin to prevent heart disease.  Diabetes screening involves taking a blood sample to check your fasting blood sugar level. This should be done once every 3 years after age 8 if  you are at a normal weight and without risk factors for diabetes. Testing should be considered at a younger age or be carried out more frequently if you are overweight and have at least 1 risk factor for diabetes.  Colorectal cancer can be detected and often prevented. Most routine colorectal cancer screening begins at the age of 57 and continues through age 41. However, your health care provider may recommend screening at an earlier age if you have risk factors for colon cancer. On a yearly basis, your health care provider may provide home test kits to check for hidden blood in the stool. A small camera at the end of a tube may be used to directly examine the colon (sigmoidoscopy or colonoscopy) to detect the earliest forms of colorectal cancer. Talk to your health care provider about this at age 62 when routine screening begins. A direct exam of the colon should be repeated every 5-10 years through age 69, unless early forms of precancerous polyps or small growths are found.  People who are at an increased risk for hepatitis B should be screened for this virus. You are considered at high risk for hepatitis B if:  You were born in a country where hepatitis B occurs often. Talk with your health care provider about which countries are considered high risk.  Your parents were born in a high-risk country and you have not received a shot to protect against hepatitis B (hepatitis B vaccine).  You have HIV or AIDS.  You use needles to inject street drugs.  You live with, or have sex with, someone who has hepatitis B.  You are a man who has sex with other men (MSM).  You get hemodialysis treatment.  You take certain medicines for conditions like cancer, organ transplantation, and autoimmune conditions.  Hepatitis C blood testing is recommended for all people born from [birth year] through 1967 and any individual with known risk factors for hepatitis C.  Healthy men should no longer receive prostate-specific  antigen (PSA) blood tests as part of routine cancer screening. Talk to your health care provider about prostate cancer screening.  Testicular cancer screening is not recommended for adolescents or adult males who have no symptoms. Screening includes self-exam, a health care provider exam, and other screening tests. Consult with your health care provider about any symptoms you have or any concerns you have about testicular cancer.  Practice safe sex. Use condoms and avoid high-risk sexual practices to reduce the spread of sexually transmitted infections (STIs).  You should be screened for STIs, including gonorrhea and chlamydia if:  You are sexually active and are younger than 24 years.  You are older than 24 years, and your health care provider tells you that you are at risk for this type of infection.  Your sexual activity has changed since you were last screened, and you are at an increased risk for chlamydia or gonorrhea. Ask your health care provider if you are at risk.  If you are  at risk of being infected with HIV, it is recommended that you take a prescription medicine daily to prevent HIV infection. This is called pre-exposure prophylaxis (PrEP). You are considered at risk if:  You are a man who has sex with other men (MSM).  You are a heterosexual man who is sexually active with multiple partners.  You take drugs by injection.  You are sexually active with a partner who has HIV.  Talk with your health care provider about whether you are at high risk of being infected with HIV. If you choose to begin PrEP, you should first be tested for HIV. You should then be tested every 3 months for as long as you are taking PrEP.  Use sunscreen. Apply sunscreen liberally and repeatedly throughout the day. You should seek shade when your shadow is shorter than you. Protect yourself by wearing long sleeves, pants, a wide-brimmed hat, and sunglasses year round whenever you are outdoors.  Tell  your health care provider of new moles or changes in moles, especially if there is a change in shape or color. Also, tell your health care provider if a mole is larger than the size of a pencil eraser.  A one-time screening for abdominal aortic aneurysm (AAA) and surgical repair of large AAAs by ultrasound is recommended for men aged 33-75 years who are current or former smokers.  Stay current with your vaccines (immunizations). Document Released: 12/24/2007 Document Revised: 07/02/2013 Document Reviewed: 11/22/2010 [facility] Patient Information 2015 [place], [place]. This information is not intended to replace advice given to you by your health care provider. Make sure you discuss any questions you have with your health care provider.

## 2014-04-15 NOTE — Progress Notes (Signed)
Subjective:    Patient ID: Richard Mahoney, male    DOB: 25-Jul-1945, 68 y.o.   MRN: [id number]  HPI Patient presents to clinic today for annual exam, [place].    Health Maintenance: Dental -- Dr. Volanda Napoleon, every 6 months. Vision -- Wears prescription glasses, yearly eye exam. Immunizations -- UTD, needs flu and pneumonia. Colonoscopy -- every 10 years, last in 2009.  MWV:  1. Risk factors, based on past  M,S,F history: CAD, HLD.  2.  Physical activities: Walk, do yard work, and golf, multiple times per week.   3.  Depression/mood: None identified.  4.  Hearing: no major deficits  5.  ADL's: Independent in all aspects of daily living  6.  Fall risk: Low  7.  Home safety: No problems identified  8.  Height weight, and visual acuity: height and weight stable, uses glasses.  9.  Counseling: Regular exercise and heart healthy diet all encouraged.  10. Lab orders based on risk factors: Lab profile discuessed  11. Referral: Maintain follow up with Cardiology and Neurology/Headache Specialist.  12. Care plan: continue current regimen  13. Cognitive assessment: alert and oriented with normal affect, no cognitive dysfunction.  14. Screening: Health maintenance UTD, as above.  15. Provider List Update: Dr. Angelena Form (cardiology), Dr. Delice Lesch (neurology), Dr. Domingo Cocking (headache specialist), Dr. Yong Channel (Family)    Review of Systems Patient denies fevers, chills, nausea, vomiting, diarrhea, chest pain, shortness of breath, orthopnea, headache, syncope. Denies lower extremity edema, abdominal pain, change in appetite, change in bowel movements. Patient denies rashes, musculoskeletal complaints. No other specific complaints in a complete review of systems.    Past Medical History  Diagnosis Date  . Nephrolithiasis   . Coronary artery disease     a. ETT 9/13: + ischemic ECG changes;  b. ETT-MV 9/13: EF 62%;  + ischemic EKG changes and + inf ischemia;  c. LHC 9/13: pLAD 30%, mLAD  70-80%, oDx 40-50% ==> FFR hemodynamically significant stenosis in mLAD ==> PCI: Xience Xpedition DES to mLAD; Cath 06/01/12   single vessel CAD w/ patent mid LAD stent and normal LV systolic function  . GERD (gastroesophageal reflux disease)   . Hyperlipidemia LDL goal < 70   . Fibromyalgia     History   Social History  . Marital Status: Married    Spouse Name: N/A    Number of Children: 2  . Years of Education: N/A   Occupational History  . Retired    Social History Main Topics  . Smoking status: Never Smoker   . Smokeless tobacco: Never Used  . Alcohol Use: No  . Drug Use: No  . Sexual Activity: Not Currently   Other Topics Concern  . Not on file   Social History Narrative  . No narrative on file    Past Surgical History  Procedure Laterality Date  . Inguinal hernia repair  1990's    right  . Knee arthroscopy  1990's    right  . Cataract extraction w/ intraocular lens implant  08/2011    right  . Cardiac catheterization  04/06/2012; 06/01/2012  . Coronary angioplasty with stent placement  04/11/2012    DES LAD  . Cystoscopy w/ stone manipulation  2009    Family History  Problem Relation Age of Onset  . Diabetes Mother   . Heart attack Mother 24  . Diabetes Father   . Heart attack Father 32  . Diabetes Sister   . Arthritis Brother  Allergies  Allergen Reactions  . Sulfa Antibiotics Rash    rash    Current Outpatient Prescriptions on File Prior to Visit  Medication Sig Dispense Refill  . aspirin EC 81 MG tablet Take 81 mg by mouth at bedtime.      Marland Kitchen atorvastatin (LIPITOR) 20 MG tablet Take 1 tablet (20 mg total) by mouth every other day.  45 tablet  3  . clopidogrel (PLAVIX) 75 MG tablet Take 1 tablet (75 mg total) by mouth daily.  30 tablet  11  . nitroGLYCERIN (NITROSTAT) 0.4 MG SL tablet Place 1 tablet (0.4 mg total) under the tongue every 5 (five) minutes as needed for chest pain.  25 tablet  6  . pantoprazole (PROTONIX) 40 MG tablet TAKE 1  TABLET EVERY DAY  30 tablet  3   No current facility-administered medications on file prior to visit.   The PFS history was reviewed with the pt at time of visit.  EXAM: BP 110/72  Pulse 66  Temp(Src) 98.4 F (36.9 C) (Oral)  Resp 18  Ht 6' (1.829 m)  Wt 189 lb 14.4 oz (86.138 kg)  BMI 25.75 kg/m2     Objective:   Physical Exam  Nursing note and vitals reviewed. Constitutional: He is oriented to person, place, and time. He appears well-developed and well-nourished. No distress.  HENT:  Head: Normocephalic and atraumatic.  Right Ear: External ear normal.  Left Ear: External ear normal.  Nose: Nose normal.  Mouth/Throat: Oropharynx is clear and moist. No oropharyngeal exudate.  Bilat TMs normal.  Eyes: Conjunctivae and EOM are normal. Pupils are equal, round, and reactive to light. Right eye exhibits no discharge. Left eye exhibits no discharge. No scleral icterus.  Neck: Normal range of motion. Neck supple. No JVD present. No tracheal deviation present. No thyromegaly present.  Cardiovascular: Normal rate, regular rhythm, normal heart sounds and intact distal pulses.  Exam reveals no gallop and no friction rub.   No murmur heard. Pulmonary/Chest: Effort normal and breath sounds normal. No stridor. No respiratory distress. He has no wheezes. He has no rales. He exhibits no tenderness.  No CVA ttp.  Abdominal: Soft. Bowel sounds are normal. He exhibits no distension and no mass. There is no tenderness. There is no rebound and no guarding.  Genitourinary:  Declined.  Musculoskeletal: Normal range of motion. He exhibits no edema and no tenderness.  Gait Normal.  Lymphadenopathy:    He has no cervical adenopathy.  Neurological: He is alert and oriented to person, place, and time. He has normal reflexes. No cranial nerve deficit. He exhibits normal muscle tone. Coordination normal.  Skin: Skin is warm and dry. No rash noted. He is not diaphoretic. No erythema. No pallor.    Psychiatric: He has a normal mood and affect. His behavior is normal. Judgment and thought content normal.     Lab Results  Component Value Date   WBC 4.6 07/16/2013   HGB 14.2 07/16/2013   HCT 41.2 07/16/2013   PLT 230.0 07/16/2013   GLUCOSE 98 07/16/2013   CHOL 138 03/04/2014   TRIG 141.0 03/04/2014   HDL 39.60 03/04/2014   LDLDIRECT 52 06/02/2012   LDLCALC 70 03/04/2014   ALT 20 03/04/2014   AST 22 03/04/2014   NA 138 07/16/2013   K 5.1 07/16/2013   CL 104 07/16/2013   CREATININE 1.3 07/16/2013   BUN 20 07/16/2013   CO2 28 07/16/2013   TSH 2.19 10/24/2012   PSA 3.76 12/06/2012  INR 1.00 06/01/2012        Assessment & Plan:  Chrstopher was seen today for annual exam.  Diagnoses and associated orders for this visit:  Need for prophylactic vaccination and inoculation against influenza - Flu vaccine HIGH DOSE PF (Fluzone Tri High dose)  Need for pneumococcal vaccine - Pneumococcal conjugate vaccine 13-valent  Annual physical exam Comments: over all well. Health maintenance UTD. Continue healthy diet and regular exercise. - CBC with Differential - Basic Metabolic Panel - TSH - PSA - POC Urinalysis Dipstick    Return precautions provided, and patient handout on health maintenance.  Plan to follow up as needed, or for worsening or persistent symptoms despite treatment.  Patient Instructions  We will call with your lab results when available.  Continue your current medication regimens.  Continue heart health diet and regular exercise.   If emergency symptoms discussed during visit developed, seek medical attention immediately.  Followup as needed, or for worsening or persistent symptoms despite treatment.

## 2014-04-16 ENCOUNTER — Telehealth: Payer: Self-pay | Admitting: Physician Assistant

## 2014-04-16 ENCOUNTER — Other Ambulatory Visit: Payer: Self-pay | Admitting: Physician Assistant

## 2014-04-16 ENCOUNTER — Encounter: Payer: Self-pay | Admitting: Physician Assistant

## 2014-04-16 DIAGNOSIS — R972 Elevated prostate specific antigen [PSA]: Secondary | ICD-10-CM

## 2014-04-16 NOTE — Telephone Encounter (Signed)
Left HIPAA compliant message for return call.

## 2014-04-17 NOTE — Telephone Encounter (Signed)
Per Rodman Key called and spoke with pt and pt is aware of urology referral.  Pt states he is having burning with urination.  Per Rodman Key pt should continue to monitor burning with urination.  Pt's urine on 04/15/14 was clear. Pt wanted Rodman Key to conisder giving pt a abx to see if his PSA would lower.  Per Rodman Key he would like pt to consider being evaluated by urology since pt's PSA has been increasing over the last 5 years.  Pt verbalized understanding.

## 2014-04-18 ENCOUNTER — Telehealth: Payer: Self-pay | Admitting: Neurology

## 2014-04-18 NOTE — Telephone Encounter (Signed)
Pt canceled appt for 04-24-14 and will call back to resch

## 2014-04-21 DIAGNOSIS — R51 Headache: Secondary | ICD-10-CM | POA: Diagnosis not present

## 2014-04-21 DIAGNOSIS — M542 Cervicalgia: Secondary | ICD-10-CM | POA: Diagnosis not present

## 2014-04-21 DIAGNOSIS — M791 Myalgia: Secondary | ICD-10-CM | POA: Diagnosis not present

## 2014-04-21 DIAGNOSIS — M5481 Occipital neuralgia: Secondary | ICD-10-CM | POA: Diagnosis not present

## 2014-04-21 DIAGNOSIS — G4452 New daily persistent headache (NDPH): Secondary | ICD-10-CM | POA: Diagnosis not present

## 2014-04-24 ENCOUNTER — Ambulatory Visit: Payer: PRIVATE HEALTH INSURANCE | Admitting: Neurology

## 2014-05-19 ENCOUNTER — Encounter: Payer: Self-pay | Admitting: Cardiovascular Disease

## 2014-05-22 ENCOUNTER — Telehealth: Payer: Self-pay | Admitting: Cardiovascular Disease

## 2014-05-22 NOTE — Telephone Encounter (Signed)
Richard Mahoney from [facility] urology is requesting surgical clearance, to hold Plavix 7 days, and aspirin 5 days prior the   prostate biopsy procedure, scheduled for December 30 th 2015. Pt was seen by Dr. Angelena Form on 12/09/13. Pt would like to be call when he can hold these medications. Clearance need to faxed to [facility] at  Fax # [phone].

## 2014-05-22 NOTE — Telephone Encounter (Signed)
Left pt a message to call back. 

## 2014-05-22 NOTE — Telephone Encounter (Signed)
JAIME FROM [facility] REQUESTING PT HOLD PLAVIX FOR 7 DAYS PRIOR TO PROSTATE BIOPSY BY DR Junious Silk, [place] TO [phone]

## 2014-05-23 NOTE — Telephone Encounter (Signed)
He can hold his ASA and Plavix 7 days before his planned surgical procedure. Please fax this note to the urology office.   Darlina Guys, MD 05/23/2014 12:50 PM

## 2014-05-23 NOTE — Telephone Encounter (Signed)
Pt notified and note faxed to [facility]

## 2014-06-09 ENCOUNTER — Encounter: Payer: Self-pay | Admitting: Cardiovascular Disease

## 2014-06-09 ENCOUNTER — Ambulatory Visit (INDEPENDENT_AMBULATORY_CARE_PROVIDER_SITE_OTHER): Payer: PRIVATE HEALTH INSURANCE | Admitting: Cardiovascular Disease

## 2014-06-09 VITALS — BP 110/78 | HR 87 | Ht 72.0 in | Wt 195.0 lb

## 2014-06-09 DIAGNOSIS — I2510 Atherosclerotic heart disease of native coronary artery without angina pectoris: Secondary | ICD-10-CM

## 2014-06-09 DIAGNOSIS — E785 Hyperlipidemia, unspecified: Secondary | ICD-10-CM

## 2014-06-09 MED ORDER — ATORVASTATIN CALCIUM 10 MG PO TABS: 10.0000 mg | ORAL_TABLET | Freq: Every day | ORAL | Status: DC

## 2014-06-09 NOTE — Patient Instructions (Addendum)
Your physician wants you to follow-up in:  12 months.  You will receive a reminder letter in the mail two months in advance. If you don't receive a letter, please call our office to schedule the follow-up appointment.  Your physician has recommended you make the following change in your medication: Change atorvastatin to 10 mg by mouth daily

## 2014-06-09 NOTE — Progress Notes (Signed)
History of Present Illness: 68 yo male with history of CAD, kidney stones, GERD here today for cardiac follow up. Initially seen in 2013 after abnormal stress test. Stress myoview 04/02/12 with ST depression at peak exercise and inferior wall defect concerning for ischemia. Cardiac cath on 04/06/12 with moderately severe mid LAD lesion involving the diagonal branch. He was brought back for PCI on 04/11/12 and FFR of the LAD was 0.77. A Xience Expedition (2.75 mm x 23 mm) drug eluting stent was placed in the mid LAD and angioplasty was performed on the ostium of the diagonal. Recurrent chest pain November 2013 with cardiac cath 06/01/12 demonstrating stable disease with patency of LAD stent. He has tolerated Lipitor 20 mg every other day. Elevated PSA and seeing urology for biopsy.   He is here today for cardiac follow up. He has been doing well. No chest pain or SOB. He has been active. He has been exercising every day.    Primary Care Physician: Phoebe Sharps  Last Lipid Profile:Lipid Panel     Component Value Date/Time   CHOL 138 03/04/2014 0839   TRIG 141.0 03/04/2014 0839   HDL 39.60 03/04/2014 0839   CHOLHDL 3 03/04/2014 0839   VLDL 28.2 03/04/2014 0839   LDLCALC 70 03/04/2014 0839    Past Medical History  Diagnosis Date  . Nephrolithiasis   . Coronary artery disease     a. ETT 9/13: + ischemic ECG changes;  b. ETT-MV 9/13: EF 62%;  + ischemic EKG changes and + inf ischemia;  c. LHC 9/13: pLAD 30%, mLAD 70-80%, oDx 40-50% ==> FFR hemodynamically significant stenosis in mLAD ==> PCI: Xience Xpedition DES to mLAD; Cath 06/01/12   single vessel CAD w/ patent mid LAD stent and normal LV systolic function  . GERD (gastroesophageal reflux disease)   . Hyperlipidemia LDL goal < 70   . Fibromyalgia     Past Surgical History  Procedure Laterality Date  . Inguinal hernia repair  1990's    right  . Knee arthroscopy  1990's    right  . Cataract extraction w/ intraocular lens implant   08/2011    right  . Cardiac catheterization  04/06/2012; 06/01/2012  . Coronary angioplasty with stent placement  04/11/2012    DES LAD  . Cystoscopy w/ stone manipulation  2009    Current Outpatient Prescriptions  Medication Sig Dispense Refill  . aspirin EC 81 MG tablet Take 81 mg by mouth at bedtime.    Marland Kitchen atorvastatin (LIPITOR) 20 MG tablet Take 1 tablet (20 mg total) by mouth every other day. 45 tablet 3  . clopidogrel (PLAVIX) 75 MG tablet Take 1 tablet (75 mg total) by mouth daily. 30 tablet 11  . imipramine (TOFRANIL) 25 MG tablet Take 75 mg by mouth daily.  1  . nitroGLYCERIN (NITROSTAT) 0.4 MG SL tablet Place 1 tablet (0.4 mg total) under the tongue every 5 (five) minutes as needed for chest pain. 25 tablet 6  . pantoprazole (PROTONIX) 40 MG tablet TAKE 1 TABLET EVERY DAY 30 tablet 3   No current facility-administered medications for this visit.    Allergies  Allergen Reactions  . Sulfa Antibiotics Rash    rash    History   Social History  . Marital Status: Married    Spouse Name: N/A    Number of Children: 2  . Years of Education: N/A   Occupational History  . Retired    Social History Main Topics  .  Smoking status: Never Smoker   . Smokeless tobacco: Never Used  . Alcohol Use: No  . Drug Use: No  . Sexual Activity: Not Currently   Other Topics Concern  . Not on file   Social History Narrative    Family History  Problem Relation Age of Onset  . Diabetes Mother   . Heart attack Mother 59  . Diabetes Father   . Heart attack Father 20  . Diabetes Sister   . Arthritis Brother     Review of Systems:  As stated in the HPI and otherwise negative.   BP 110/78 mmHg  Pulse 87  Ht 6' (1.829 m)  Wt 195 lb (88.451 kg)  BMI 26.44 kg/m2  SpO2 97%  Physical Examination: General: Well developed, well nourished, NAD HEENT: OP clear, mucus membranes moist SKIN: warm, dry. No rashes. Neuro: No focal deficits Musculoskeletal: Muscle strength 5/5 all  ext Psychiatric: Mood and affect normal Neck: No JVD, no carotid bruits, no thyromegaly, no lymphadenopathy. Lungs:Clear bilaterally, no wheezes, rhonci, crackles Cardiovascular: Regular rate and rhythm. No murmurs, gallops or rubs. Abdomen:Soft. Bowel sounds present. Non-tender.  Extremities: No lower extremity edema. Pulses are 2 + in the bilateral DP/PT.  Cardiac cath 06/01/12:  Left main: No obstructive disease noted.  Left Anterior Descending Artery: Moderate caliber vessel that courses to the apex. The proximal vessel has a 30% stenosis. The mid vessel has a patent stented segment with no evidence of restenosis. The distal LAD has no obstructive disease. The diagonal branch is covered by the LAD stent and has a 10% ostial stenosis. There is excellent flow down the diagonal branch.  Intermediate Artery: Small caliber vessel with 30% stenosis.  Circumflex Artery: Large caliber, co-dominant vessel with no obstructive disease noted. There are three small caliber obtuse marginal branches that have no obstructive disease noted.  Right Coronary Artery: Small caliber, co-dominant vessel with mild plaque in the proximal and mid segments.  Left Ventricular Angiogram: LVEF=60-65%   Assessment and Plan:   1. CAD: He is doing well. No recent chest pain. Continue ASA and Plavix. No beta blocker with bradycardia. Xanax prn anxiety.  2.GERD: Continue PPI  3. HLD: Will change Lipitor to 10 mg daily. Lipids well controlled.

## 2014-06-17 ENCOUNTER — Encounter: Payer: Self-pay | Admitting: Internal Medicine

## 2014-06-17 ENCOUNTER — Encounter: Payer: Self-pay | Admitting: Cardiovascular Disease

## 2014-06-17 ENCOUNTER — Other Ambulatory Visit: Payer: Self-pay | Admitting: Cardiovascular Disease

## 2014-06-18 ENCOUNTER — Other Ambulatory Visit: Payer: Self-pay

## 2014-06-18 ENCOUNTER — Telehealth: Payer: Self-pay | Admitting: Cardiovascular Disease

## 2014-06-18 MED ORDER — PANTOPRAZOLE SODIUM 40 MG PO TBEC: 40.0000 mg | DELAYED_RELEASE_TABLET | Freq: Every day | ORAL | Status: DC

## 2014-06-18 NOTE — Telephone Encounter (Signed)
Left Collie Siad from [facility] urology a message to call back.

## 2014-06-18 NOTE — Telephone Encounter (Signed)
New message      Pt having prostate biopsy on 06-25-14. Pt need to start levaquin on 12-16.  Pt will be out of town starting 12-10 until the 14th.  PLEASE CALL BACK TODAY because pt need to pick up medication today.  Is it ok to start levaquin?

## 2014-06-18 NOTE — Telephone Encounter (Signed)
Richard Mahoney states that it is fine to disregard this phone call the medication issue is taken care of.

## 2014-06-19 ENCOUNTER — Encounter (HOSPITAL_COMMUNITY): Payer: Self-pay | Admitting: Cardiovascular Disease

## 2014-07-14 ENCOUNTER — Other Ambulatory Visit: Payer: Self-pay | Admitting: Urology

## 2014-07-18 ENCOUNTER — Telehealth: Payer: Self-pay | Admitting: Cardiovascular Disease

## 2014-07-18 NOTE — Telephone Encounter (Signed)
Received request from Nurse fax box, documents faxed for surgical clearance. To: [facility] Fax number: [phone] Attention: [phone]

## 2014-07-22 DIAGNOSIS — C61 Malignant neoplasm of prostate: Secondary | ICD-10-CM | POA: Diagnosis not present

## 2014-07-24 DIAGNOSIS — C61 Malignant neoplasm of prostate: Secondary | ICD-10-CM | POA: Diagnosis not present

## 2014-07-24 DIAGNOSIS — R278 Other lack of coordination: Secondary | ICD-10-CM | POA: Diagnosis not present

## 2014-07-24 DIAGNOSIS — M6281 Muscle weakness (generalized): Secondary | ICD-10-CM | POA: Diagnosis not present

## 2014-07-28 DIAGNOSIS — M6281 Muscle weakness (generalized): Secondary | ICD-10-CM | POA: Diagnosis not present

## 2014-07-28 DIAGNOSIS — R278 Other lack of coordination: Secondary | ICD-10-CM | POA: Diagnosis not present

## 2014-08-11 ENCOUNTER — Other Ambulatory Visit (HOSPITAL_COMMUNITY): Payer: Self-pay | Admitting: *Deleted

## 2014-08-11 NOTE — Patient Instructions (Addendum)
Richard Mahoney  08/11/2014   Your procedure is scheduled on: Monday  08/18/2014  Report to [facility] Main  Entrance and follow signs to               [place] at  [place] AM.  Call this number if you have problems the morning of surgery [phone]   Remember: [place] Richard Mahoney'S OFFICE !   CLEAR LIQUID DIET   Foods Allowed                                                                     Foods Excluded  Coffee and tea, regular and decaf                             liquids that you cannot  Plain Jell-O in any flavor                                             see through such as: Fruit ices (not with fruit pulp)                                     milk, soups, orange juice  Iced Popsicles                                    All solid food Carbonated beverages, regular and diet                                    Cranberry, grape and apple juices Sports drinks like Gatorade Lightly seasoned clear broth or consume(fat free) Sugar, honey syrup  Sample Menu Breakfast                                Lunch                                     Supper Cranberry juice                    Beef broth                            Chicken broth Jell-O                                     Grape juice                           Apple juice Coffee or tea  Jell-O                                      Popsicle                                                Coffee or tea                        Coffee or tea  _____________________________________________________________________    Do not eat food or drink liquids :After Midnight.     Take these medicines the morning of surgery with A SIP OF WATER: Pantoprazole, Aspirin (as he normally takes as per instructed by Richard Mahoney)                               You may not have any metal on your body including hair pins and              piercings  Do not wear jewelry, make-up,  lotions, powders or perfumes.             Do not wear nail polish.  Do not shave  48 hours prior to surgery.              Men may shave face and neck.   Do not bring valuables to the hospital. [place].  Contacts, dentures or bridgework may not be worn into surgery.  Leave suitcase in the car. After surgery it may be brought to your room.     Patients discharged the day of surgery will not be allowed to drive home.  Name and phone number of your driver:  Special Instructions: N/A              Please read over the following fact sheets you were given: _____________________________________________________________________             [facility] - Preparing for Surgery Before surgery, you can play an important role.  Because skin is not sterile, your skin needs to be as free of germs as possible.  You can reduce the number of germs on your skin by washing with CHG (chlorahexidine gluconate) soap before surgery.  CHG is an antiseptic cleaner which kills germs and bonds with the skin to continue killing germs even after washing. Please DO NOT use if you have an allergy to CHG or antibacterial soaps.  If your skin becomes reddened/irritated stop using the CHG and inform your nurse when you arrive at Short Stay. Do not shave (including legs and underarms) for at least 48 hours prior to the first CHG shower.  You may shave your face/neck. Please follow these instructions carefully:  1.  Shower with CHG Soap the night before surgery and the  morning of Surgery.  2.  If you choose to wash your hair, wash your hair first as usual with your  normal  shampoo.  3.  After you shampoo, rinse your hair and body thoroughly to remove the  shampoo.  4.  Use CHG as you would any other liquid soap.  You can apply chg directly  to the skin and wash                       Gently with a scrungie or clean washcloth.  5.  Apply the CHG Soap  to your body ONLY FROM THE NECK DOWN.   Do not use on face/ open                           Wound or open sores. Avoid contact with eyes, ears mouth and genitals (private parts).                       Wash face,  Genitals (private parts) with your normal soap.             6.  Wash thoroughly, paying special attention to the area where your surgery  will be performed.  7.  Thoroughly rinse your body with warm water from the neck down.  8.  DO NOT shower/wash with your normal soap after using and rinsing off  the CHG Soap.                9.  Pat yourself dry with a clean towel.            10.  Wear clean pajamas.            11.  Place clean sheets on your bed the night of your first shower and do not  sleep with pets. Day of Surgery : Do not apply any lotions/deodorants the morning of surgery.  Please wear clean clothes to the hospital/surgery center.  FAILURE TO FOLLOW THESE INSTRUCTIONS MAY RESULT IN THE CANCELLATION OF YOUR SURGERY PATIENT SIGNATURE_________________________________  NURSE SIGNATURE__________________________________  ________________________________________________________________________   Richard Mahoney  An incentive spirometer is a tool that can help keep your lungs clear and active. This tool measures how well you are filling your lungs with each breath. Taking long deep breaths may help reverse or decrease the chance of developing breathing (pulmonary) problems (especially infection) following:  A long period of time when you are unable to move or be active. BEFORE THE PROCEDURE   If the spirometer includes an indicator to show your best effort, your nurse or respiratory therapist will set it to a desired goal.  If possible, sit up straight or lean slightly forward. Try not to slouch.  Hold the incentive spirometer in an upright position. INSTRUCTIONS FOR USE   Sit on the edge of your bed if possible, or sit up as far as you can in bed or on a  chair.  Hold the incentive spirometer in an upright position.  Breathe out normally.  Place the mouthpiece in your mouth and seal your lips tightly around it.  Breathe in slowly and as deeply as possible, raising the piston or the ball toward the top of the column.  Hold your breath for 3-5 seconds or for as long as possible. Allow the piston or ball to fall to the bottom of the column.  Remove the mouthpiece from your mouth and breathe out normally.  Rest for a few seconds and repeat Steps 1 through 7 at least 10 times every 1-2 hours when you are awake. Take your time and take a few normal breaths between deep breaths.  The spirometer may include an indicator to  show your best effort. Use the indicator as a goal to work toward during each repetition.  After each set of 10 deep breaths, practice coughing to be sure your lungs are clear. If you have an incision (the cut made at the time of surgery), support your incision when coughing by placing a pillow or rolled up towels firmly against it. Once you are able to get out of bed, walk around indoors and cough well. You may stop using the incentive spirometer when instructed by your caregiver.  RISKS AND COMPLICATIONS  Take your time so you do not get dizzy or light-headed.  If you are in pain, you may need to take or ask for pain medication before doing incentive spirometry. It is harder to take a deep breath if you are having pain. AFTER USE  Rest and breathe slowly and easily.  It can be helpful to keep track of a log of your progress. Your caregiver can provide you with a simple table to help with this. If you are using the spirometer at home, follow these instructions: [place] IF:   You are having difficultly using the spirometer.  You have trouble using the spirometer as often as instructed.  Your pain medication is not giving enough relief while using the spirometer.  You develop fever of 100.5 F (38.1 C) or  higher. SEEK IMMEDIATE MEDICAL CARE IF:   You cough up bloody sputum that had not been present before.  You develop fever of 102 F (38.9 C) or greater.  You develop worsening pain at or near the incision site. MAKE SURE YOU:   Understand these instructions.  Will watch your condition.  Will get help right away if you are not doing well or get worse. Document Released: 11/07/2006 Document Revised: 09/19/2011 Document Reviewed: 01/08/2007 Exit[employer] Patient Information 2014 Exit[employer], [place].   ________________________________________________________________________  WHAT IS A BLOOD TRANSFUSION? Blood Transfusion Information  A transfusion is the replacement of blood or some of its parts. Blood is made up of multiple cells which provide different functions.  Red blood cells carry oxygen and are used for blood loss replacement.  White blood cells fight against infection.  Platelets control bleeding.  Plasma helps clot blood.  Other blood products are available for specialized needs, such as hemophilia or other clotting disorders. BEFORE THE TRANSFUSION  Who gives blood for transfusions?   Healthy volunteers who are fully evaluated to make sure their blood is safe. This is blood bank blood. Transfusion therapy is the safest it has ever been in the practice of medicine. Before blood is taken from a donor, a complete history is taken to make sure that person has no history of diseases nor engages in risky social behavior (examples are intravenous drug use or sexual activity with multiple partners). The donor's travel history is screened to minimize risk of transmitting infections, such as malaria. The donated blood is tested for signs of infectious diseases, such as HIV and hepatitis. The blood is then tested to be sure it is compatible with you in order to minimize the chance of a transfusion reaction. If you or a relative donates blood, this is often done in anticipation of surgery  and is not appropriate for emergency situations. It takes many days to process the donated blood. RISKS AND COMPLICATIONS Although transfusion therapy is very safe and saves many lives, the main dangers of transfusion include:   Getting an infectious disease.  Developing a transfusion reaction. This is an allergic reaction  to something in the blood you were given. Every precaution is taken to prevent this. The decision to have a blood transfusion has been considered carefully by your caregiver before blood is given. Blood is not given unless the benefits outweigh the risks. AFTER THE TRANSFUSION  Right after receiving a blood transfusion, you will usually feel much better and more energetic. This is especially true if your red blood cells have gotten low (anemic). The transfusion raises the level of the red blood cells which carry oxygen, and this usually causes an energy increase.  The nurse administering the transfusion will monitor you carefully for complications. HOME CARE INSTRUCTIONS  No special instructions are needed after a transfusion. You may find your energy is better. Speak with your caregiver about any limitations on activity for underlying diseases you may have. SEEK MEDICAL CARE IF:   Your condition is not improving after your transfusion.  You develop redness or irritation at the intravenous (IV) site. SEEK IMMEDIATE MEDICAL CARE IF:  Any of the following symptoms occur over the next 12 hours:  Shaking chills.  You have a temperature by mouth above 102 F (38.9 C), not controlled by medicine.  Chest, back, or muscle pain.  People around you feel you are not acting correctly or are confused.  Shortness of breath or difficulty breathing.  Dizziness and fainting.  You get a rash or develop hives.  You have a decrease in urine output.  Your urine turns a dark color or changes to pink, red, or brown. Any of the following symptoms occur over the next 10  days:  You have a temperature by mouth above 102 F (38.9 C), not controlled by medicine.  Shortness of breath.  Weakness after normal activity.  The white part of the eye turns yellow (jaundice).  You have a decrease in the amount of urine or are urinating less often.  Your urine turns a dark color or changes to pink, red, or brown. Document Released: 06/24/2000 Document Revised: 09/19/2011 Document Reviewed: 02/11/2008 [facility] Patient Information 2014 [place], [place].  _______________________________________________________________________

## 2014-08-13 ENCOUNTER — Ambulatory Visit (HOSPITAL_COMMUNITY)
Admission: RE | Admit: 2014-08-13 | Discharge: 2014-08-13 | Disposition: A | Discharge status: Home / Self Care | Payer: PRIVATE HEALTH INSURANCE | Source: Ambulatory Visit | Attending: Anesthesiology | Admitting: Anesthesiology

## 2014-08-13 ENCOUNTER — Encounter (HOSPITAL_COMMUNITY)
Admission: RE | Admit: 2014-08-13 | Discharge: 2014-08-13 | Disposition: A | Discharge status: Home / Self Care | Payer: PRIVATE HEALTH INSURANCE | Source: Ambulatory Visit | Attending: Urology | Admitting: Urology

## 2014-08-13 ENCOUNTER — Encounter (HOSPITAL_COMMUNITY): Payer: Self-pay

## 2014-08-13 DIAGNOSIS — C61 Malignant neoplasm of prostate: Secondary | ICD-10-CM

## 2014-08-13 DIAGNOSIS — Z01818 Encounter for other preprocedural examination: Secondary | ICD-10-CM | POA: Diagnosis not present

## 2014-08-13 LAB — CBC
HEMATOCRIT: 40.1 % (ref 39.0–52.0)
Hemoglobin: 13.1 g/dL (ref 13.0–17.0)
MCH: 30.3 pg (ref 26.0–34.0)
MCHC: 32.7 g/dL (ref 30.0–36.0)
MCV: 92.8 fL (ref 78.0–100.0)
PLATELETS: 217 10*3/uL (ref 150–400)
RBC: 4.32 MIL/uL (ref 4.22–5.81)
RDW: 12.4 % (ref 11.5–15.5)
WBC: 5.1 10*3/uL (ref 4.0–10.5)

## 2014-08-13 LAB — BASIC METABOLIC PANEL
Anion gap: 6 (ref 5–15)
BUN: 22 mg/dL (ref 6–23)
CALCIUM: 8.7 mg/dL (ref 8.4–10.5)
CO2: 29 mmol/L (ref 19–32)
Chloride: 106 mmol/L (ref 96–112)
Creatinine, Ser: 1.02 mg/dL (ref 0.50–1.35)
GFR calc Af Amer: 84 mL/min — ABNORMAL LOW (ref >90)
GFR, EST NON AFRICAN AMERICAN: 72 mL/min — AB (ref >90)
Glucose, Bld: 103 mg/dL — ABNORMAL HIGH (ref 70–99)
Potassium: 4.9 mmol/L (ref 3.5–5.1)
Sodium: 141 mmol/L (ref 135–145)

## 2014-08-13 NOTE — Progress Notes (Signed)
Health Care Power of Attorney and Living Will on chart.

## 2014-08-16 NOTE — H&P (Signed)
Chief Complaint Prostate Cancer   Reason For Visit Reason for consult: To discuss treatment options for prostate cancer and specifically to consider a robotic prostatectomy  Physician requesting consult: Dr. Eda Keys  PCP: Dr. Rushie Chestnut   History of Present Illness [name] is a 69 year old gentleman who was found to have an elevated PSA of 5.74 and left mid prostate induration prompting a prostate needle biopsy by Dr. Junious Silk on 06/25/14. This demonstrated Gleason 3+4=7 adenocarcinoma of the prostate with 3 out of 12 biopsy cores. He has been thoroughly counseled about his treatment/management options and is most interested in surgical therapy.     ** He does have a history of coronary artery disease status post cardiac stent in 2013. This is currently managed with Plavix 75 mg and aspirin 81 mg.    TNM stage: cT2b Nx Mx  PSA: 5.74  Gleason score: 3+4=7  Biopsy (06/25/14): 3/12 cores positive -- L lateral apex (80%, 3+3=6), L lateral mid (80%, 3+4=7), R lateral base (10%, 3+3=6)  Prostate volume: Vol 20.5 cc    Nomogram  OC disease: 46%  EPE: 53%  SVI: 3%  LNI: 4%  PFS (surgery): 85% at 5 years, 75% at 10 years    Urinary function: He has fairly mild lower urinary tract symptoms including intermittent seen weak stream. These symptoms are not bothersome. IPSS is 10.  Erectile function: He is sexually inactive. He is unsure of his erectile function. This is a low priority to him.   Past Medical History Problems  1. History of Coronary artery disease (I25.10) 2. History of esophageal reflux (Z87.19) 3. History of gout (Z87.39) 4. History of kidney stones (W11.914)  Surgical History Problems  1. History of Cath Stent Placement 2. History of Hernia Repair 3. History of Knee Surgery  He has undergone a prior left inguinal hernia repair.   Current Meds 1. Aspirin 81 MG Oral Tablet;  Therapy: (Recorded:11Nov2015) to Recorded 2. Atorvastatin  Calcium 10 MG Oral Tablet;  Therapy: (Recorded:11Nov2015) to Recorded 3. Clopidogrel Bisulfate 75 MG Oral Tablet;  Therapy: (Recorded:11Nov2015) to Recorded 4. Imipramine HCl - 25 MG Oral Tablet;  Therapy: (Recorded:11Nov2015) to Recorded 5. Melatonin CAPS;  Therapy: (Recorded:11Nov2015) to Recorded 6. Pantoprazole Sodium TBEC;  Therapy: (Recorded:11Nov2015) to Recorded  Allergies Medication  1. Sulfa Drugs  Family History Problems  1. Family history of Diabetes Mellitus : Father 2. Family history of Diabetes Mellitus : Mother 3. Family history of Diabetes Mellitus : Sister 4. Family history of Family Health Status Number Of Children   1 son; 1 daughter 5. Family history of arthritis (Z82.61) : Brother 6. Family history of diabetes mellitus (Z83.3) : Mother, Father, Sister 64. Denied: Family history of prostate cancer  Social History Problems    Denied: Alcohol Use   Death in the family, father   (Age 39) Heart Attack   Death in the family, mother   Age 1(CHF)   Marital History - Currently Married   Never a smoker   Occupation:   retired   Two children   1 Son & 1 Daughter  Review of Systems Constitutional, skin, eye, otolaryngeal, hematologic/lymphatic, cardiovascular, pulmonary, endocrine, musculoskeletal, gastrointestinal, neurological and psychiatric system(s) were reviewed and pertinent findings if present are noted and are otherwise negative.    Vitals Vital Signs [Data Includes: Last 1 Day]  Recorded: 12Jan2016 01:19PM  Height: 6 ft  Weight: 189 lb  BMI Calculated: 25.63 BSA Calculated: 2.08 Blood Pressure: 129 / 78 Temperature: 97.6 F  Heart Rate: 83  Physical Exam Constitutional: Well nourished and well developed . No acute distress.  ENT:. The ears and nose are normal in appearance.  Neck: The appearance of the neck is normal and no neck mass is present.  Pulmonary: No respiratory distress, normal respiratory rhythm and effort and clear  bilateral breath sounds.  Cardiovascular: Heart rate and rhythm are normal . No peripheral edema.  Abdomen: The abdomen is soft and nontender. No masses are palpated. No CVA tenderness. No hernias are palpable. No hepatosplenomegaly noted.  Rectal: Rectal exam demonstrates normal sphincter tone, no tenderness and no masses. Prostate size is estimated to be 30 g. He does have a concerning nodular lesion at the left mid and apical portion of the gland consistent with cT2b disease. The prostate has a palpable nodule and is not tender. The left seminal vesicle is nonpalpable. The right seminal vesicle is nonpalpable. The perineum is normal on inspection.  Lymphatics: The femoral and inguinal nodes are not enlarged or tender.  Skin: Normal skin turgor, no visible rash and no visible skin lesions.  Neuro/Psych:. Mood and affect are appropriate.    Results/Data Urine [Data Includes: Last 1 Day]   [id number]  COLOR YELLOW   APPEARANCE CLEAR   SPECIFIC GRAVITY 1.030   pH 5.5   GLUCOSE NEG mg/dL  BILIRUBIN NEG   KETONE NEG mg/dL  BLOOD NEG   PROTEIN NEG mg/dL  UROBILINOGEN 0.2 mg/dL  NITRITE NEG   LEUKOCYTE ESTERASE NEG    I have independently reviewed his medical records, PSA results, and pathology report. Findings are as dictated above.   Assessment Assessed  1. Prostate cancer (C61)  Plan Health Maintenance  1. UA With REFLEX; [Do Not Release]; Status:Complete;   Done: [id number] 01:02PM Prostate cancer  2. Follow-up Keep Future Appt Office  Follow-up  Status: Complete  Done: [id number]  Discussion/Summary 1. Prostate cancer: Mr. Kolakowski places a priority on this quantity of life. He especially stated that erectile function was of a low importance to him. Based on his priorities as well as his concerning rectal exam, he does wish to proceed with curative therapy and is most interested in surgical treatment.   The patient was counseled about the natural history of prostate cancer and the  standard treatment options that are available for prostate cancer. It was explained to him how his age and life expectancy, clinical stage, Gleason score, and PSA affect his prognosis, the decision to proceed with additional staging studies, as well as how that information influences recommended treatment strategies. We discussed the roles for active surveillance, radiation therapy, surgical therapy, androgen deprivation, as well as ablative therapy options for the treatment of prostate cancer as appropriate to his individual cancer situation. We discussed the risks and benefits of these options with regard to their impact on cancer control and also in terms of potential adverse events, complications, and impact on quiality of life particularly related to urinary, bowel, and sexual function. The patient was encouraged to ask questions throughout the discussion today and all questions were answered to his stated satisfaction. In addition, the patient was provided with and/or directed to appropriate resources and literature for further education about prostate cancer and treatment options.   We discussed surgical therapy for prostate cancer including the different available surgical approaches. We discussed, in detail, the risks and expectations of surgery with regard to cancer control, urinary control, and erectile function as well as the expected postoperative recovery process. Additional risks of surgery including but  not limited to bleeding, infection, hernia formation, nerve damage, lymphocele formation, bowel/rectal injury potentially necessitating colostomy, damage to the urinary tract resulting in urine leakage, urethral stricture, and the cardiopulmonary risks such as myocardial infarction, stroke, death, venothromboembolism, etc. were explained. The risk of open surgical conversion for robotic/laparoscopic prostatectomy was also discussed.     After our discussion, he does wish to proceed with surgical  therapy. We will contact Dr. Julianne Handler to ensure that he would not require a stress test reoperative lead to assess his cardiac risk prior to major noncardiac surgery. Our tentative plan is to perform a right nerve sparing robotic-assisted laparoscopic radical prostatectomy and bilateral pelvic lymphadenectomy.    Cc: Dr. Festus Aloe  Dr. Rushie Chestnut    SignaturesElectronically signed by : Raynelle Bring, M.D.; Jul 22 2014  3:46PM EST

## 2014-08-18 ENCOUNTER — Encounter (HOSPITAL_COMMUNITY): Payer: Self-pay | Admitting: Anesthesiology

## 2014-08-18 ENCOUNTER — Inpatient Hospital Stay (HOSPITAL_COMMUNITY)
Admission: RE | Admit: 2014-08-18 | Discharge: 2014-08-19 | DRG: 708 | Disposition: A | Discharge status: Home / Self Care | Payer: PRIVATE HEALTH INSURANCE | Source: Ambulatory Visit | Attending: Urology | Admitting: Urology

## 2014-08-18 ENCOUNTER — Inpatient Hospital Stay (HOSPITAL_COMMUNITY): Payer: PRIVATE HEALTH INSURANCE | Admitting: Anesthesiology

## 2014-08-18 ENCOUNTER — Encounter (HOSPITAL_COMMUNITY)
Admission: RE | Disposition: A | Discharge status: Home / Self Care | Payer: Self-pay | Source: Ambulatory Visit | Attending: Urology

## 2014-08-18 DIAGNOSIS — Z833 Family history of diabetes mellitus: Secondary | ICD-10-CM

## 2014-08-18 DIAGNOSIS — Z79899 Other long term (current) drug therapy: Secondary | ICD-10-CM

## 2014-08-18 DIAGNOSIS — M797 Fibromyalgia: Secondary | ICD-10-CM | POA: Diagnosis not present

## 2014-08-18 DIAGNOSIS — R3912 Poor urinary stream: Secondary | ICD-10-CM | POA: Diagnosis present

## 2014-08-18 DIAGNOSIS — K219 Gastro-esophageal reflux disease without esophagitis: Secondary | ICD-10-CM | POA: Diagnosis not present

## 2014-08-18 DIAGNOSIS — Z7982 Long term (current) use of aspirin: Secondary | ICD-10-CM

## 2014-08-18 DIAGNOSIS — Z87442 Personal history of urinary calculi: Secondary | ICD-10-CM | POA: Diagnosis not present

## 2014-08-18 DIAGNOSIS — Z8546 Personal history of malignant neoplasm of prostate: Secondary | ICD-10-CM | POA: Diagnosis present

## 2014-08-18 DIAGNOSIS — C61 Malignant neoplasm of prostate: Principal | ICD-10-CM | POA: Diagnosis present

## 2014-08-18 DIAGNOSIS — I2510 Atherosclerotic heart disease of native coronary artery without angina pectoris: Secondary | ICD-10-CM | POA: Diagnosis present

## 2014-08-18 DIAGNOSIS — Z955 Presence of coronary angioplasty implant and graft: Secondary | ICD-10-CM

## 2014-08-18 HISTORY — PX: LYMPHADENECTOMY: SHX5960

## 2014-08-18 HISTORY — PX: ROBOT ASSISTED LAPAROSCOPIC RADICAL PROSTATECTOMY: SHX5141

## 2014-08-18 LAB — HEMOGLOBIN AND HEMATOCRIT, BLOOD
HCT: 37 % — ABNORMAL LOW (ref 39.0–52.0)
HEMOGLOBIN: 12.3 g/dL — AB (ref 13.0–17.0)

## 2014-08-18 LAB — ABO/RH: ABO/RH(D): A POS

## 2014-08-18 LAB — TYPE AND SCREEN
ABO/RH(D): A POS
Antibody Screen: NEGATIVE

## 2014-08-18 SURGERY — ROBOTIC ASSISTED LAPAROSCOPIC RADICAL PROSTATECTOMY LEVEL 2
Anesthesia: General

## 2014-08-18 MED ORDER — STERILE WATER FOR IRRIGATION IR SOLN
Status: DC | PRN
  Administered 2014-08-18: 3000 mL

## 2014-08-18 MED ORDER — CEFAZOLIN SODIUM 1-5 GM-% IV SOLN
1.0000 g | Freq: Three times a day (TID) | INTRAVENOUS | Status: AC
  Administered 2014-08-18 (×2): 1 g via INTRAVENOUS
  Filled 2014-08-18 (×2): qty 50

## 2014-08-18 MED ORDER — SODIUM CHLORIDE 0.9 % IJ SOLN
INTRAMUSCULAR | Status: AC
  Filled 2014-08-18: qty 10

## 2014-08-18 MED ORDER — IMIPRAMINE HCL 50 MG PO TABS
75.0000 mg | ORAL_TABLET | Freq: Every day | ORAL | Status: DC
  Administered 2014-08-18: 75 mg via ORAL
  Filled 2014-08-18 (×2): qty 1

## 2014-08-18 MED ORDER — HYDROMORPHONE HCL 1 MG/ML IJ SOLN
INTRAMUSCULAR | Status: DC | PRN
  Administered 2014-08-18 (×5): .4 mg via INTRAVENOUS

## 2014-08-18 MED ORDER — DEXAMETHASONE SODIUM PHOSPHATE 10 MG/ML IJ SOLN
INTRAMUSCULAR | Status: DC | PRN
  Administered 2014-08-18: 10 mg via INTRAVENOUS

## 2014-08-18 MED ORDER — NEOSTIGMINE METHYLSULFATE 10 MG/10ML IV SOLN
INTRAVENOUS | Status: DC | PRN
  Administered 2014-08-18: 4 mg via INTRAVENOUS

## 2014-08-18 MED ORDER — EPHEDRINE SULFATE 50 MG/ML IJ SOLN
INTRAMUSCULAR | Status: AC
  Filled 2014-08-18: qty 1

## 2014-08-18 MED ORDER — DIPHENHYDRAMINE HCL 12.5 MG/5ML PO ELIX: 12.5000 mg | ORAL_SOLUTION | Freq: Four times a day (QID) | ORAL | Status: DC | PRN

## 2014-08-18 MED ORDER — SALINE SPRAY 0.65 % NA SOLN
1.0000 | Freq: Every day | NASAL | Status: DC | PRN
  Filled 2014-08-18: qty 44

## 2014-08-18 MED ORDER — LACTATED RINGERS IV SOLN
INTRAVENOUS | Status: DC | PRN
  Administered 2014-08-18: 08:00:00

## 2014-08-18 MED ORDER — SODIUM CHLORIDE 0.9 % IR SOLN
Status: DC | PRN
  Administered 2014-08-18: 1000 mL via INTRAVESICAL

## 2014-08-18 MED ORDER — HYDROMORPHONE HCL 1 MG/ML IJ SOLN
0.2500 mg | INTRAMUSCULAR | Status: DC | PRN
  Administered 2014-08-18 (×3): 0.25 mg via INTRAVENOUS

## 2014-08-18 MED ORDER — CIPROFLOXACIN HCL 500 MG PO TABS: 500.0000 mg | ORAL_TABLET | Freq: Two times a day (BID) | ORAL | Status: DC

## 2014-08-18 MED ORDER — ONDANSETRON HCL 4 MG/2ML IJ SOLN
INTRAMUSCULAR | Status: DC | PRN
  Administered 2014-08-18: 4 mg via INTRAVENOUS

## 2014-08-18 MED ORDER — ACETAMINOPHEN 325 MG PO TABS: 650.0000 mg | ORAL_TABLET | ORAL | Status: DC | PRN

## 2014-08-18 MED ORDER — CISATRACURIUM BESYLATE 20 MG/10ML IV SOLN
INTRAVENOUS | Status: AC
  Filled 2014-08-18: qty 10

## 2014-08-18 MED ORDER — NITROGLYCERIN 0.4 MG SL SUBL: 0.4000 mg | SUBLINGUAL_TABLET | SUBLINGUAL | Status: DC | PRN

## 2014-08-18 MED ORDER — SUCCINYLCHOLINE CHLORIDE 20 MG/ML IJ SOLN
INTRAMUSCULAR | Status: DC | PRN
  Administered 2014-08-18: 100 mg via INTRAVENOUS

## 2014-08-18 MED ORDER — DIPHENHYDRAMINE HCL 50 MG/ML IJ SOLN: 12.5000 mg | Freq: Four times a day (QID) | INTRAMUSCULAR | Status: DC | PRN

## 2014-08-18 MED ORDER — ASPIRIN EC 81 MG PO TBEC: 81.0000 mg | DELAYED_RELEASE_TABLET | Freq: Every day | ORAL | Status: DC

## 2014-08-18 MED ORDER — GLYCOPYRROLATE 0.2 MG/ML IJ SOLN
INTRAMUSCULAR | Status: AC
  Filled 2014-08-18: qty 3

## 2014-08-18 MED ORDER — DEXAMETHASONE SODIUM PHOSPHATE 10 MG/ML IJ SOLN
INTRAMUSCULAR | Status: AC
  Filled 2014-08-18: qty 1

## 2014-08-18 MED ORDER — HEPARIN SODIUM (PORCINE) 1000 UNIT/ML IJ SOLN
INTRAMUSCULAR | Status: AC
  Filled 2014-08-18: qty 1

## 2014-08-18 MED ORDER — PANTOPRAZOLE SODIUM 40 MG PO TBEC
40.0000 mg | DELAYED_RELEASE_TABLET | Freq: Every day | ORAL | Status: DC
Start: 2014-08-19 — End: 2014-08-19
  Administered 2014-08-19: 40 mg via ORAL
  Filled 2014-08-18: qty 1

## 2014-08-18 MED ORDER — PROPOFOL 10 MG/ML IV BOLUS
INTRAVENOUS | Status: AC
  Filled 2014-08-18: qty 20

## 2014-08-18 MED ORDER — KETOROLAC TROMETHAMINE 15 MG/ML IJ SOLN
15.0000 mg | Freq: Four times a day (QID) | INTRAMUSCULAR | Status: DC
  Administered 2014-08-18 – 2014-08-19 (×4): 15 mg via INTRAVENOUS
  Filled 2014-08-18 (×4): qty 1

## 2014-08-18 MED ORDER — MIDAZOLAM HCL 2 MG/2ML IJ SOLN
INTRAMUSCULAR | Status: AC
  Filled 2014-08-18: qty 2

## 2014-08-18 MED ORDER — HYDROMORPHONE HCL 1 MG/ML IJ SOLN
INTRAMUSCULAR | Status: AC
  Filled 2014-08-18: qty 1

## 2014-08-18 MED ORDER — BUPIVACAINE-EPINEPHRINE (PF) 0.25% -1:200000 IJ SOLN
INTRAMUSCULAR | Status: AC
  Filled 2014-08-18: qty 30

## 2014-08-18 MED ORDER — LACTATED RINGERS IV SOLN
INTRAVENOUS | Status: DC | PRN
Start: 2014-08-18 — End: 2014-08-18
  Administered 2014-08-18 (×3): via INTRAVENOUS

## 2014-08-18 MED ORDER — MIDAZOLAM HCL 5 MG/5ML IJ SOLN
INTRAMUSCULAR | Status: DC | PRN
  Administered 2014-08-18: 2 mg via INTRAVENOUS

## 2014-08-18 MED ORDER — MORPHINE SULFATE 10 MG/ML IJ SOLN
2.0000 mg | INTRAMUSCULAR | Status: DC | PRN
  Administered 2014-08-18 (×2): 2 mg via INTRAVENOUS
  Filled 2014-08-18 (×3): qty 1

## 2014-08-18 MED ORDER — HYDROMORPHONE HCL 2 MG/ML IJ SOLN
INTRAMUSCULAR | Status: AC
  Filled 2014-08-18: qty 1

## 2014-08-18 MED ORDER — ONDANSETRON HCL 4 MG/2ML IJ SOLN
INTRAMUSCULAR | Status: AC
  Filled 2014-08-18: qty 2

## 2014-08-18 MED ORDER — SUFENTANIL CITRATE 50 MCG/ML IV SOLN
INTRAVENOUS | Status: DC | PRN
  Administered 2014-08-18: 10 ug via INTRAVENOUS
  Administered 2014-08-18: 20 ug via INTRAVENOUS
  Administered 2014-08-18 (×2): 10 ug via INTRAVENOUS

## 2014-08-18 MED ORDER — PROMETHAZINE HCL 25 MG/ML IJ SOLN: 6.2500 mg | INTRAMUSCULAR | Status: DC | PRN

## 2014-08-18 MED ORDER — BUPIVACAINE-EPINEPHRINE 0.25% -1:200000 IJ SOLN
INTRAMUSCULAR | Status: DC | PRN
  Administered 2014-08-18: 30 mL

## 2014-08-18 MED ORDER — ACETAMINOPHEN 10 MG/ML IV SOLN
1000.0000 mg | Freq: Once | INTRAVENOUS | Status: AC
  Administered 2014-08-18: 1000 mg via INTRAVENOUS
  Filled 2014-08-18: qty 100

## 2014-08-18 MED ORDER — CEFAZOLIN SODIUM-DEXTROSE 2-3 GM-% IV SOLR
INTRAVENOUS | Status: AC
  Filled 2014-08-18: qty 50

## 2014-08-18 MED ORDER — LIDOCAINE HCL (CARDIAC) 20 MG/ML IV SOLN
INTRAVENOUS | Status: DC | PRN
  Administered 2014-08-18: 100 mg via INTRAVENOUS

## 2014-08-18 MED ORDER — POLYVINYL ALCOHOL 1.4 % OP SOLN
1.0000 [drp] | Freq: Every day | OPHTHALMIC | Status: DC | PRN
  Filled 2014-08-18: qty 15

## 2014-08-18 MED ORDER — EPHEDRINE SULFATE 50 MG/ML IJ SOLN
INTRAMUSCULAR | Status: DC | PRN
  Administered 2014-08-18 (×3): 10 mg via INTRAVENOUS

## 2014-08-18 MED ORDER — CISATRACURIUM BESYLATE (PF) 10 MG/5ML IV SOLN
INTRAVENOUS | Status: DC | PRN
  Administered 2014-08-18 (×2): 6 mg via INTRAVENOUS
  Administered 2014-08-18: 12 mg via INTRAVENOUS

## 2014-08-18 MED ORDER — ATORVASTATIN CALCIUM 10 MG PO TABS
10.0000 mg | ORAL_TABLET | Freq: Every day | ORAL | Status: DC
  Administered 2014-08-18: 10 mg via ORAL
  Filled 2014-08-18: qty 1

## 2014-08-18 MED ORDER — LIDOCAINE HCL (CARDIAC) 20 MG/ML IV SOLN
INTRAVENOUS | Status: AC
  Filled 2014-08-18: qty 5

## 2014-08-18 MED ORDER — CEFAZOLIN SODIUM-DEXTROSE 2-3 GM-% IV SOLR
2.0000 g | INTRAVENOUS | Status: AC
  Administered 2014-08-18: 2 g via INTRAVENOUS

## 2014-08-18 MED ORDER — DOCUSATE SODIUM 100 MG PO CAPS
100.0000 mg | ORAL_CAPSULE | Freq: Two times a day (BID) | ORAL | Status: DC
  Administered 2014-08-18 – 2014-08-19 (×2): 100 mg via ORAL
  Filled 2014-08-18 (×2): qty 1

## 2014-08-18 MED ORDER — SUFENTANIL CITRATE 50 MCG/ML IV SOLN
INTRAVENOUS | Status: AC
  Filled 2014-08-18: qty 1

## 2014-08-18 MED ORDER — SODIUM CHLORIDE 0.9 % IV BOLUS (SEPSIS)
1000.0000 mL | Freq: Once | INTRAVENOUS | Status: AC
  Administered 2014-08-18: 1000 mL via INTRAVENOUS

## 2014-08-18 MED ORDER — HYDROCODONE-ACETAMINOPHEN 5-325 MG PO TABS: 1.0000 | ORAL_TABLET | Freq: Four times a day (QID) | ORAL | Status: DC | PRN

## 2014-08-18 MED ORDER — KCL IN DEXTROSE-NACL 20-5-0.45 MEQ/L-%-% IV SOLN
INTRAVENOUS | Status: DC
  Administered 2014-08-18 (×2): via INTRAVENOUS
  Filled 2014-08-18 (×4): qty 1000

## 2014-08-18 MED ORDER — PROPOFOL 10 MG/ML IV BOLUS
INTRAVENOUS | Status: DC | PRN
  Administered 2014-08-18: 170 mg via INTRAVENOUS

## 2014-08-18 MED ORDER — GLYCOPYRROLATE 0.2 MG/ML IJ SOLN
INTRAMUSCULAR | Status: DC | PRN
  Administered 2014-08-18: .6 mg via INTRAVENOUS

## 2014-08-18 SURGICAL SUPPLY — 48 items
CABLE HIGH FREQUENCY MONO STRZ (ELECTRODE) ×3 IMPLANT
CATH FOLEY 2WAY SLVR 18FR 30CC (CATHETERS) ×3 IMPLANT
CATH ROBINSON RED A/P 16FR (CATHETERS) ×3 IMPLANT
CATH ROBINSON RED A/P 8FR (CATHETERS) ×3 IMPLANT
CATH TIEMANN FOLEY 18FR 5CC (CATHETERS) ×3 IMPLANT
CHLORAPREP W/TINT 26ML (MISCELLANEOUS) ×3 IMPLANT
CLIP LIGATING HEM O LOK PURPLE (MISCELLANEOUS) ×6 IMPLANT
CLOTH BEACON ORANGE TIMEOUT ST (SAFETY) ×3 IMPLANT
COVER SURGICAL LIGHT HANDLE (MISCELLANEOUS) ×3 IMPLANT
COVER TIP SHEARS 8 DVNC (MISCELLANEOUS) ×2 IMPLANT
COVER TIP SHEARS 8MM DA VINCI (MISCELLANEOUS) ×1
CUTTER ECHEON FLEX ENDO 45 340 (ENDOMECHANICALS) ×3 IMPLANT
DECANTER SPIKE VIAL GLASS SM (MISCELLANEOUS) IMPLANT
DRAPE SURG IRRIG POUCH 19X23 (DRAPES) ×3 IMPLANT
DRSG TEGADERM 4X4.75 (GAUZE/BANDAGES/DRESSINGS) ×3 IMPLANT
DRSG TEGADERM 6X8 (GAUZE/BANDAGES/DRESSINGS) ×6 IMPLANT
ELECT REM PT RETURN 9FT ADLT (ELECTROSURGICAL) ×3
ELECTRODE REM PT RTRN 9FT ADLT (ELECTROSURGICAL) ×2 IMPLANT
GLOVE BIO SURGEON STRL SZ 6.5 (GLOVE) ×3 IMPLANT
GLOVE BIOGEL M STRL SZ7.5 (GLOVE) ×6 IMPLANT
GOWN STRL REUS W/TWL LRG LVL3 (GOWN DISPOSABLE) ×9 IMPLANT
HOLDER FOLEY CATH W/STRAP (MISCELLANEOUS) ×3 IMPLANT
IV LACTATED RINGERS 1000ML (IV SOLUTION) IMPLANT
KIT ACCESSORY DA VINCI DISP (KITS) ×1
KIT ACCESSORY DVNC DISP (KITS) ×2 IMPLANT
LIQUID BAND (GAUZE/BANDAGES/DRESSINGS) IMPLANT
MANIFOLD NEPTUNE II (INSTRUMENTS) ×3 IMPLANT
NDL SAFETY ECLIPSE 18X1.5 (NEEDLE) ×2 IMPLANT
NEEDLE HYPO 18GX1.5 SHARP (NEEDLE) ×1
PACK ROBOT UROLOGY CUSTOM (CUSTOM PROCEDURE TRAY) ×3 IMPLANT
RELOAD GREEN ECHELON 45 (STAPLE) ×3 IMPLANT
SET TUBE IRRIG SUCTION NO TIP (IRRIGATION / IRRIGATOR) ×3 IMPLANT
SOLUTION ELECTROLUBE (MISCELLANEOUS) ×3 IMPLANT
SUT ETHILON 3 0 PS 1 (SUTURE) ×3 IMPLANT
SUT MNCRL 3 0 RB1 (SUTURE) ×2 IMPLANT
SUT MNCRL 3 0 VIOLET RB1 (SUTURE) ×2 IMPLANT
SUT MNCRL AB 4-0 PS2 18 (SUTURE) ×6 IMPLANT
SUT MONOCRYL 3 0 RB1 (SUTURE) ×2
SUT VIC AB 0 CT1 27 (SUTURE) ×1
SUT VIC AB 0 CT1 27XBRD ANTBC (SUTURE) ×2 IMPLANT
SUT VIC AB 0 UR5 27 (SUTURE) ×3 IMPLANT
SUT VIC AB 2-0 SH 27 (SUTURE) ×1
SUT VIC AB 2-0 SH 27X BRD (SUTURE) ×2 IMPLANT
SUT VICRYL 0 UR6 27IN ABS (SUTURE) ×6 IMPLANT
SYR 27GX1/2 1ML LL SAFETY (SYRINGE) ×3 IMPLANT
TOWEL OR 17X26 10 PK STRL BLUE (TOWEL DISPOSABLE) ×3 IMPLANT
TOWEL OR NON WOVEN STRL DISP B (DISPOSABLE) ×3 IMPLANT
WATER STERILE IRR 1500ML POUR (IV SOLUTION) IMPLANT

## 2014-08-18 NOTE — Progress Notes (Signed)
Utilization review completed.  

## 2014-08-18 NOTE — Discharge Instructions (Signed)
1. Activity:  You are encouraged to ambulate frequently (about every hour during waking hours) to help prevent blood clots from forming in your legs or lungs.  However, you should not engage in any heavy lifting (> 10-15 lbs), strenuous activity, or straining. 2. Diet: You should continue a clear liquid diet until passing gas from below.  Once this occurs, you may advance your diet to a soft diet that would be easy to digest (i.e soups, scrambled eggs, mashed potatoes, etc.) for 24 hours just as you would if getting over a bad stomach flu.  If tolerating this diet well for 24 hours, you may then begin eating regular food.  It will be normal to have some amount of bloating, nausea, and abdominal discomfort intermittently. 3. Prescriptions:  You will be provided a prescription for pain medication to take as needed.  If your pain is not severe enough to require the prescription pain medication, you may take Tylenol instead.  You should also take an over the counter stool softener (Colace 100 mg twice daily) to avoid straining with bowel movements as the pain medication may constipate you. Finally, you will also be provided a prescription for an antibiotic to begin the day prior to your return visit in the office for catheter removal. 4. Catheter care: You will be taught how to take care of the catheter by the nursing staff prior to discharge from the hospital.  You may use both a leg bag and the larger bedside bag but it is recommended to at least use the bigger bedside bag at nighttime as the leg bag is small and will fill up overnight and also does not drain as well when lying flat. You may periodically feel a strong urge to void with the catheter in place.  This is a bladder spasm and most often can occur when having a bowel movement or when you are moving around. It is typically self-limited and usually will stop after a few minutes.  You may use some Vaseline or Neosporin around the tip of the catheter to  reduce friction at the tip of the penis. 5. Incisions: You may remove your dressing bandages the 2nd day after surgery.  You most likely will have a few small staples in each of the incisions and once the bandages are removed, the incisions may stay open to air.  You may start showering (not soaking or bathing in water) 48 hours after surgery and the incisions simply need to be patted dry after the shower.  No additional care is needed. 6. What to call us about: You should call the office [phone]) if you develop fever > 101, persistent vomiting, or the catheter stops draining. Also, feel free to call with any other questions you may have and remember the handout that was provided to you as a reference preoperatively which answers many of the common questions that arise after surgery. 7. You may resume plavix, advil, aleve, vitamins, and supplements 7 days after surgery.

## 2014-08-18 NOTE — Anesthesia Preprocedure Evaluation (Addendum)
Anesthesia Evaluation  Patient identified by MRN, date of birth, ID band Patient awake    Reviewed: Allergy & Precautions, NPO status , Patient's Chart, lab work & pertinent test results  Airway Mallampati: II  TM Distance: >3 FB Neck ROM: Full    Dental  (+) Teeth Intact, Dental Advisory Given   Pulmonary    Pulmonary exam normal       Cardiovascular + CAD and + Cardiac Stents  Cath 06/01/12  single vessel CAD w/ patent mid LAD stent and normal LV systolic function   Neuro/Psych    GI/Hepatic Neg liver ROS, GERD-  Medicated,  Endo/Other  negative endocrine ROS  Renal/GU negative Renal ROS     Musculoskeletal  (+) Fibromyalgia -  Abdominal   Peds  Hematology   Anesthesia Other Findings   Reproductive/Obstetrics                            Anesthesia Physical Anesthesia Plan  ASA: III  Anesthesia Plan: General   Post-op Pain Management:    Induction: Intravenous  Airway Management Planned: Oral ETT  Additional Equipment:   Intra-op Plan:   Post-operative Plan: Extubation in OR  Informed Consent: I have reviewed the patients History and Physical, chart, labs and discussed the procedure including the risks, benefits and alternatives for the proposed anesthesia with the patient or authorized representative who has indicated his/her understanding and acceptance.   Dental advisory given  Plan Discussed with: CRNA, Anesthesiologist and Surgeon  Anesthesia Plan Comments:        Anesthesia Quick Evaluation

## 2014-08-18 NOTE — Transfer of Care (Signed)
Immediate Anesthesia Transfer of Care Note  Patient: Richard Mahoney  Procedure(s) Performed: Procedure(s): ROBOTIC ASSISTED LAPAROSCOPIC RADICAL PROSTATECTOMY LEVEL 2 (N/A) LYMPHADENECTOMY (Bilateral)  Patient Location: PACU  Anesthesia Type:General  Level of Consciousness: sedated  Airway & Oxygen Therapy: Patient Spontanous Breathing and Patient connected to face mask oxygen  Post-op Assessment: Report given to RN and Post -op Vital signs reviewed and stable  Post vital signs: Reviewed and stable  Last Vitals:  Filed Vitals:   08/18/14 0520  BP: 134/92  Pulse: 95  Temp: 36.3 C  Resp: 18    Complications: No apparent anesthesia complications

## 2014-08-18 NOTE — Interval H&P Note (Signed)
History and Physical Interval Note:  08/18/2014 7:10 AM  Richard Mahoney  has presented today for surgery, with the diagnosis of PROSTATE CANCER  The various methods of treatment have been discussed with the patient and family. After consideration of risks, benefits and other options for treatment, the patient has consented to  Procedure(s): ROBOTIC ASSISTED LAPAROSCOPIC RADICAL PROSTATECTOMY LEVEL 2 (N/A) LYMPHADENECTOMY (Bilateral) as a surgical intervention .  The patient's history has been reviewed, patient examined, no change in status, stable for surgery.  I have reviewed the patient's chart and labs.  Questions were answered to the patient's satisfaction.     Antwonette Feliz,LES

## 2014-08-18 NOTE — Anesthesia Postprocedure Evaluation (Signed)
Anesthesia Post Note  Patient: Richard Mahoney  Procedure(s) Performed: Procedure(s) (LRB): ROBOTIC ASSISTED LAPAROSCOPIC RADICAL PROSTATECTOMY LEVEL 2 (N/A) LYMPHADENECTOMY (Bilateral)  Anesthesia type: general  Patient location: PACU  Post pain: Pain level controlled  Post assessment: Patient's Cardiovascular Status Stable  Last Vitals:  Filed Vitals:   08/18/14 1241  BP: 117/61  Pulse: 81  Temp: 36.5 C  Resp: 18    Post vital signs: Reviewed and stable  Level of consciousness: sedated  Complications: No apparent anesthesia complications

## 2014-08-18 NOTE — Op Note (Signed)
Preoperative diagnosis: Clinically localized adenocarcinoma of the prostate (clinical stage T2b Nx Mx)  Postoperative diagnosis: Clinically localized adenocarcinoma of the prostate (clinical stage T2b Nx Mx)  Procedure:  1. Robotic assisted laparoscopic radical prostatectomy (right nerve sparing) 2. Bilateral robotic assisted laparoscopic pelvic lymphadenectomy  Surgeon: [name]. M.D.  Assistant(s): Debbrah Alar, PA-C  Resident: Dr. Park Liter  Anesthesia: General  Complications: None  EBL: 150 mL  IVF:  2000 mL crystalloid  Specimens: 1. Prostate and seminal vesicles 2. Right pelvic lymph nodes 3. Left pelvic lymph nodes  Disposition of specimens: Pathology  Drains: 1. 20 Fr coude catheter 2. # 19 Blake pelvic drain  Indication: Richard Mahoney is a 69 y.o. patient with clinically localized prostate cancer.  After a thorough review of the management options for treatment of prostate cancer, he elected to proceed with surgical therapy and the above procedure(s).  We have discussed the potential benefits and risks of the procedure, side effects of the proposed treatment, the likelihood of the patient achieving the goals of the procedure, and any potential problems that might occur during the procedure or recuperation. Informed consent has been obtained.  Description of procedure:  The patient was taken to the operating room and a general anesthetic was administered. He was given preoperative antibiotics, placed in the dorsal lithotomy position, and prepped and draped in the usual sterile fashion. Next a preoperative timeout was performed. A urethral catheter was placed into the bladder and a site was selected near the umbilicus for placement of the camera port. This was placed using a standard open Hassan technique which allowed entry into the peritoneal cavity under direct vision and without difficulty. A 12 mm port was placed and a pneumoperitoneum established.  The camera was then used to inspect the abdomen and there was no evidence of any intra-abdominal injuries or other abnormalities. The remaining abdominal ports were then placed. 8 mm robotic ports were placed in the right lower quadrant, left lower quadrant, and far left lateral abdominal wall. A 5 mm port was placed in the right upper quadrant and a 12 mm port was placed in the right lateral abdominal wall for laparoscopic assistance. All ports were placed under direct vision without difficulty. The surgical cart was then docked.   Utilizing the cautery scissors, the bladder was reflected posteriorly allowing entry into the space of Retzius and identification of the endopelvic fascia and prostate. The periprostatic fat was then removed from the prostate allowing full exposure of the endopelvic fascia. The endopelvic fascia was then incised from the apex back to the base of the prostate bilaterally and the underlying levator muscle fibers were swept laterally off the prostate thereby isolating the dorsal venous complex. The dorsal vein was then stapled and divided with a 45 mm Flex Echelon stapler. Attention then turned to the bladder neck which was divided anteriorly thereby allowing entry into the bladder and exposure of the urethral catheter. The catheter balloon was deflated and the catheter was brought into the operative field and used to retract the prostate anteriorly. The posterior bladder neck was then examined and was divided allowing further dissection between the bladder and prostate posteriorly until the vasa deferentia and seminal vessels were identified. The vasa deferentia were isolated, divided, and lifted anteriorly. The seminal vesicles were dissected down to their tips with care to control the seminal vascular arterial blood supply. These structures were then lifted anteriorly and the space between Denonvillier's fascia and the anterior rectum was developed  with a combination of sharp and blunt  dissection. This isolated the vascular pedicles of the prostate.  The lateral prostatic fascia on the right side of the prostate was then sharply incised allowing release of the neurovascular bundle. The vascular pedicle of the prostate on the right side was then ligated with Weck clips between the prostate and neurovascular bundle and divided with sharp cold scissor dissection resulting in neurovascular bundle preservation. On the left side, a wide non nerve sparing dissection was performed with Weck clips used to ligate the vascular pedicle of the prostate. The neurovascular bundle on the right side was then separated off the apex of the prostate and urethra.  The urethra was then sharply transected allowing the prostate specimen to be disarticulated. The pelvis was copiously irrigated and hemostasis was ensured. There was no evidence for rectal injury.  Attention then turned to the right pelvic sidewall. The fibrofatty tissue between the external iliac vein, confluence of the iliac vessels, hypogastric artery, and Cooper's ligament was dissected free from the pelvic sidewall with care to preserve the obturator nerve. Weck clips were used for lymphostasis and hemostasis. An identical procedure was performed on the contralateral side and the lymphatic packets were removed for permanent pathologic analysis.  Attention then turned to the urethral anastomosis. A 2-0 Vicryl slip knot was placed between Denonvillier's fascia, the posterior bladder neck, and the posterior urethra to reapproximate these structures. A double-armed 3-0 Monocryl suture was then used to perform a 360 running tension-free anastomosis between the bladder neck and urethra. A new urethral catheter was then placed into the bladder and irrigated. There were no blood clots within the bladder and the anastomosis appeared to be watertight. A #19 Blake drain was then brought through the left lateral 8 mm port site and positioned appropriately  within the pelvis. It was secured to the skin with a nylon suture. The surgical cart was then undocked. The right lateral 12 mm port site was closed at the fascial level with a 0 Vicryl suture placed laparoscopically. All remaining ports were then removed under direct vision. The prostate specimen was removed intact within the Endopouch retrieval bag via the periumbilical camera port site. This fascial opening was closed with two running 0 Vicryl sutures. 0.25% Marcaine was then injected into all port sites and all incisions were reapproximated at the skin level with 4-0 Monocryl subcuticular sutures. Dermabond was applied. The patient appeared to tolerate the procedure well and without complications. The patient was able to be extubated and transferred to the recovery unit in satisfactory condition.   [name] MD

## 2014-08-18 NOTE — Anesthesia Procedure Notes (Signed)
Procedure Name: Intubation Date/Time: 08/18/2014 7:25 AM Performed by: Danley Danker L Patient Re-evaluated:Patient Re-evaluated prior to inductionOxygen Delivery Method: Circle system utilized Preoxygenation: Pre-oxygenation with 100% oxygen Intubation Type: IV induction Ventilation: Mask ventilation without difficulty and Oral airway inserted - appropriate to patient size Laryngoscope Size: Miller and 3 Grade View: Grade I Tube type: Oral Tube size: 8.0 mm Number of attempts: 1 Airway Equipment and Method: Stylet Placement Confirmation: ETT inserted through vocal cords under direct vision,  positive ETCO2 and breath sounds checked- equal and bilateral Secured at: 21 cm Tube secured with: Tape Dental Injury: Teeth and Oropharynx as per pre-operative assessment

## 2014-08-18 NOTE — Progress Notes (Signed)
Post-op note  Subjective: The patient is doing well.  No complaints.  Low UO  Objective: Vital signs in last 24 hours: Temp:  [97.3 F (36.3 C)-98 F (36.7 C)] 97.5 F (36.4 C) (02/08 1449) Pulse Rate:  [77-95] 77 (02/08 1449) Resp:  [11-19] 18 (02/08 1449) BP: (108-134)/(55-92) 120/57 mmHg (02/08 1449) SpO2:  [94 %-100 %] 96 % (02/08 1449) Weight:  [90.351 kg (199 lb 3 oz)] 90.351 kg (199 lb 3 oz) (02/08 0605)  Intake/Output from previous day:   Intake/Output this shift: Total I/O In: 3472.5 [I.V.:2422.5; IV Piggyback:1050] Out: 240 [Urine:50; Drains:40; Blood:150]  Physical Exam:  General: Alert and oriented. Abdomen: Soft, Nondistended. Incisions: Clean and dry. Urine: dark red  Lab Results:  Recent Labs  08/18/14 1117  HGB 12.3*  HCT 37.0*    Assessment/Plan: POD#0   1) Continue to monitor  2) DVT prophy, clears, IS, amb, pain control  3) Monitor UO.  If it does not increase will need fluid bolus and possible cath irrigation.    LOS: 0 days   [name] 08/18/2014, 3:20 PM

## 2014-08-19 ENCOUNTER — Encounter (HOSPITAL_COMMUNITY): Payer: Self-pay | Admitting: Urology

## 2014-08-19 LAB — HEMOGLOBIN AND HEMATOCRIT, BLOOD
HCT: 35.7 % — ABNORMAL LOW (ref 39.0–52.0)
Hemoglobin: 11.6 g/dL — ABNORMAL LOW (ref 13.0–17.0)

## 2014-08-19 MED ORDER — HYDROCODONE-ACETAMINOPHEN 5-325 MG PO TABS: 1.0000 | ORAL_TABLET | Freq: Four times a day (QID) | ORAL | Status: DC | PRN

## 2014-08-19 MED ORDER — BISACODYL 10 MG RE SUPP
10.0000 mg | Freq: Once | RECTAL | Status: AC
Start: 2014-08-19 — End: 2014-08-19
  Administered 2014-08-19: 10 mg via RECTAL
  Filled 2014-08-19: qty 1

## 2014-08-19 NOTE — Progress Notes (Signed)
Patient ID: Richard Mahoney, male   DOB: 12/17/1945, 69 y.o.   MRN: [id number]  1 Day Post-Op Subjective: The patient is doing well.  No nausea or vomiting. Pain is adequately controlled.  Objective: Vital signs in last 24 hours: Temp:  [97.5 F (36.4 C)-98.2 F (36.8 C)] 97.7 F (36.5 C) (02/09 0545) Pulse Rate:  [75-94] 75 (02/09 0545) Resp:  [11-19] 18 (02/09 0545) BP: (108-124)/(55-74) 124/60 mmHg (02/09 0545) SpO2:  [94 %-100 %] 96 % (02/09 0545)  Intake/Output from previous day: 02/08 0701 - 02/09 0700 In: 4072.5 [I.V.:3022.5; IV Piggyback:1050] Out: 9937 [Urine:2725; Drains:165; Blood:150] Intake/Output this shift:    Physical Exam:  General: Alert and oriented. CV: RRR Lungs: Clear bilaterally. GI: Soft, Nondistended. Incisions: Clean, dry, and intact Urine: Clear Extremities: Nontender, no erythema, no edema.  Lab Results:  Recent Labs  08/18/14 1117 08/19/14 0409  HGB 12.3* 11.6*  HCT 37.0* 35.7*      Assessment/Plan: POD# 1 s/p robotic prostatectomy.  1) SL IVF 2) Ambulate, Incentive spirometry 3) Transition to oral pain medication 4) Dulcolax suppository 5) D/C pelvic drain 6) Plan for likely discharge later today   Richard Mahoney. MD   LOS: 1 day   Richard Mahoney,LES 08/19/2014, 7:07 AM

## 2014-08-19 NOTE — Discharge Summary (Signed)
  Date of admission: 08/18/2014  Date of discharge: 08/19/2014  Admission diagnosis: Prostate Cancer  Discharge diagnosis: Prostate Cancer  History and Physical: For full details, please see admission history and physical. Briefly, Richard Mahoney is a 69 y.o. gentleman with localized prostate cancer.  After discussing management/treatment options, he elected to proceed with surgical treatment.  Hospital Course: Richard Mahoney was taken to the operating room on 08/18/2014 and underwent a robotic assisted laparoscopic radical prostatectomy. He tolerated this procedure well and without complications. Postoperatively, he was able to be transferred to a regular hospital room following recovery from anesthesia.  He was able to begin ambulating the night of surgery. He remained hemodynamically stable overnight.  He had excellent urine output with appropriately minimal output from his pelvic drain and his pelvic drain was removed on POD #1.  He was transitioned to oral pain medication, tolerated a clear liquid diet, and had met all discharge criteria and was able to be discharged home later on POD#1.  Laboratory values:  Recent Labs  08/18/14 1117 08/19/14 0409  HGB 12.3* 11.6*  HCT 37.0* 35.7*    Disposition: Home  Discharge instruction: He was instructed to be ambulatory but to refrain from heavy lifting, strenuous activity, or driving. He was instructed on urethral catheter care.  Discharge medications:     Medication List    STOP taking these medications        clopidogrel 75 MG tablet  Commonly known as:  PLAVIX     ibuprofen 200 MG tablet  Commonly known as:  ADVIL,MOTRIN      TAKE these medications        aspirin EC 81 MG tablet  Take 81 mg by mouth at bedtime.     atorvastatin 10 MG tablet  Commonly known as:  LIPITOR  Take 1 tablet (10 mg total) by mouth daily.     ciprofloxacin 500 MG tablet  Commonly known as:  CIPRO  Take 1 tablet (500 mg total) by mouth 2 (two)  times daily. Start day prior to office visit for foley removal     HYDROcodone-acetaminophen 5-325 MG per tablet  Commonly known as:  NORCO  Take 1-2 tablets by mouth every 6 (six) hours as needed.     imipramine 25 MG tablet  Commonly known as:  TOFRANIL  Take 75 mg by mouth at bedtime.     nitroGLYCERIN 0.4 MG SL tablet  Commonly known as:  NITROSTAT  Place 1 tablet (0.4 mg total) under the tongue every 5 (five) minutes as needed for chest pain.     pantoprazole 40 MG tablet  Commonly known as:  PROTONIX  Take 1 tablet (40 mg total) by mouth daily.     polyvinyl alcohol 1.4 % ophthalmic solution  Commonly known as:  LIQUIFILM TEARS  Place 1 drop into both eyes daily as needed for dry eyes.     sodium chloride 0.65 % Soln nasal spray  Commonly known as:  OCEAN  Place 1 spray into both nostrils daily as needed for congestion.        Followup: He will followup in 1 week for catheter removal and to discuss his surgical pathology results.

## 2014-09-05 ENCOUNTER — Other Ambulatory Visit: Payer: Self-pay

## 2014-09-05 ENCOUNTER — Encounter: Payer: Self-pay | Admitting: Family Medicine

## 2014-09-05 ENCOUNTER — Encounter: Payer: Self-pay | Admitting: Cardiovascular Disease

## 2014-09-05 MED ORDER — PANTOPRAZOLE SODIUM 40 MG PO TBEC: 40.0000 mg | DELAYED_RELEASE_TABLET | Freq: Every morning | ORAL | Status: DC

## 2014-09-08 ENCOUNTER — Telehealth: Payer: Self-pay | Admitting: Cardiovascular Disease

## 2014-09-08 DIAGNOSIS — E785 Hyperlipidemia, unspecified: Secondary | ICD-10-CM

## 2014-09-08 NOTE — Telephone Encounter (Signed)
  [name], Can we refill his meds as outlined below. I will be glad to refill his Alprazolam to use prn. I copied the MyChart message and pasted below.   Thanks, chris  Dr. Angelena Form, just a note to keep you up to date. I had prostate cancer surgery on Feb. 8 and everything seems to be going well. The pathology report showed that all the cancer was contained within the prostate gland which was totally removed by robotic surgery. No signs of anything else! Amen!! I do need for your office to send in a script for may medicines as I changed insurance the beginning of the year to [place] which is a mail order and would like to do a 90 day supply on the Atorvastatin and the clopidogrel. You prescribed some Alprazolam 0.5 mg when I had my stent and I took only a couple but they are outdated with 2014 date on them. I would like to get more of those on a just as needed basis.     Atorvastatin 10 mg @ 1 per day; Clopidogrel 75 mg @ 1 per day; Alprazolam 0.5 mg as needed for anxiety. 90 day supply.    Contact: Fax: [phone]; E-Scribe: [facility]; Phone: [phone].    Richard Mahoney. [address], [address] [place], [place] [address] DOB: Feb 13, 1946. Thank you!

## 2014-09-09 ENCOUNTER — Other Ambulatory Visit: Payer: Self-pay | Admitting: *Deleted

## 2014-09-09 MED ORDER — ALPRAZOLAM 0.5 MG PO TABS: 0.5000 mg | ORAL_TABLET | Freq: Two times a day (BID) | ORAL | Status: DC | PRN

## 2014-09-09 MED ORDER — ATORVASTATIN CALCIUM 10 MG PO TABS: 10.0000 mg | ORAL_TABLET | Freq: Every day | ORAL | Status: DC

## 2014-09-09 MED ORDER — CLOPIDOGREL BISULFATE 75 MG PO TABS: 75.0000 mg | ORAL_TABLET | Freq: Every day | ORAL | Status: DC

## 2014-09-09 NOTE — Telephone Encounter (Signed)
Prescriptions for Atorvastatin and Clopidogrel sent electronically.  Prescription for Xanax faxed to pharmacy

## 2014-09-09 NOTE — Addendum Note (Signed)
Addended by: Thompson Grayer on: 09/09/2014 11:41 AM   Modules accepted: Orders

## 2014-09-15 DIAGNOSIS — R278 Other lack of coordination: Secondary | ICD-10-CM | POA: Diagnosis not present

## 2014-09-15 DIAGNOSIS — M6281 Muscle weakness (generalized): Secondary | ICD-10-CM | POA: Diagnosis not present

## 2014-09-15 DIAGNOSIS — N393 Stress incontinence (female) (male): Secondary | ICD-10-CM | POA: Diagnosis not present

## 2014-09-16 ENCOUNTER — Telehealth: Payer: Self-pay | Admitting: Family Medicine

## 2014-09-16 ENCOUNTER — Encounter: Payer: Self-pay | Admitting: Family Medicine

## 2014-09-16 NOTE — Telephone Encounter (Signed)
Pt said he is under dr care for prostate surgery and need to have his physical in Sept. He was scheduled for a establishment visit in Sept and we tried to reschedule him for March. He does not want the the March appt. He only want a September appt and he want it to be a physical appt. May I schedule him in Sept for a physical in a new pt slot at 8:15 on any day of the week

## 2014-09-17 NOTE — Telephone Encounter (Signed)
See below

## 2014-09-17 NOTE — Telephone Encounter (Signed)
Suandrea-I would ask you to call about this if you don't mind. Here is my response. He also sent a mychart message.   Patient has a very complicated history per his problem list and just because it is "in the computer" as he notes in his mychart message, that does not mean I can automatically process all of the information without discussing with him.   The point of an establish visit is so I can fully review his history so I am prepared to give him the best care possible when he comes in for a physical and for any subsequent visit. I often pick up screening recommendations and I truly feel like it is needed to have this visit. The times I try to combine this visit with a physical it typically takes 45-50 minutes and that is just not feasible.   The reason the appointment changed from a physical is because I have never met him. If this was still Dr. Leanne Chang, that would certainly be a physical but that is because Dr. Leanne Chang already knows him.   He may simply keep the September visit if he desires but that would be for an establish visit and he would need to reschedule the physical.

## 2014-09-24 ENCOUNTER — Ambulatory Visit: Payer: PRIVATE HEALTH INSURANCE | Admitting: Family Medicine

## 2014-09-30 DIAGNOSIS — M6281 Muscle weakness (generalized): Secondary | ICD-10-CM | POA: Diagnosis not present

## 2014-09-30 DIAGNOSIS — R278 Other lack of coordination: Secondary | ICD-10-CM | POA: Diagnosis not present

## 2014-09-30 DIAGNOSIS — N393 Stress incontinence (female) (male): Secondary | ICD-10-CM | POA: Diagnosis not present

## 2014-10-22 LAB — PSA: PSA: 0.01

## 2014-10-24 ENCOUNTER — Encounter: Payer: Self-pay | Admitting: Family Medicine

## 2014-11-06 IMAGING — CR DG CERVICAL SPINE 2 OR 3 VIEWS
5 series · 5 of 5 positions shown · non-contrast
Comparison: None

CLINICAL DATA: Occipital neuralgia

EXAM:
CERVICAL SPINE - 2-3 VIEW

[w c-spine lat]
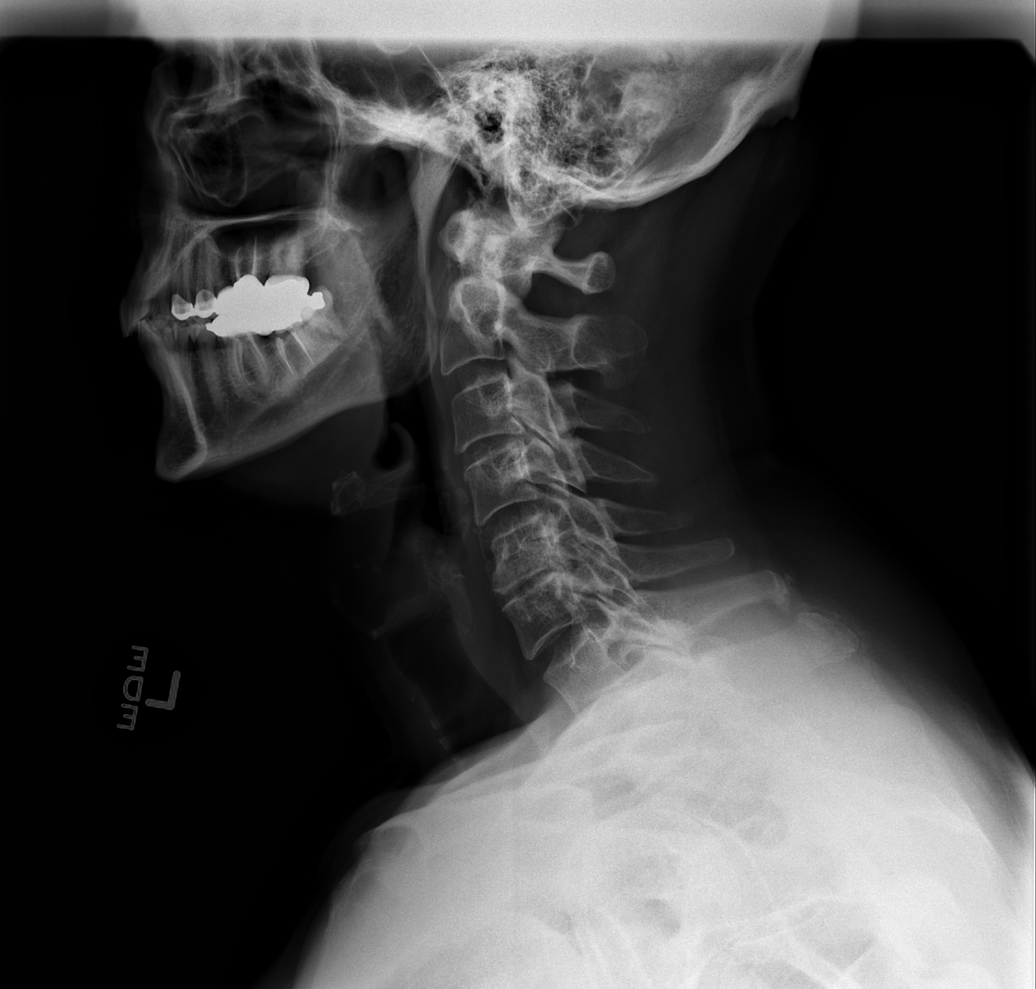

[w c-spine a.p.]
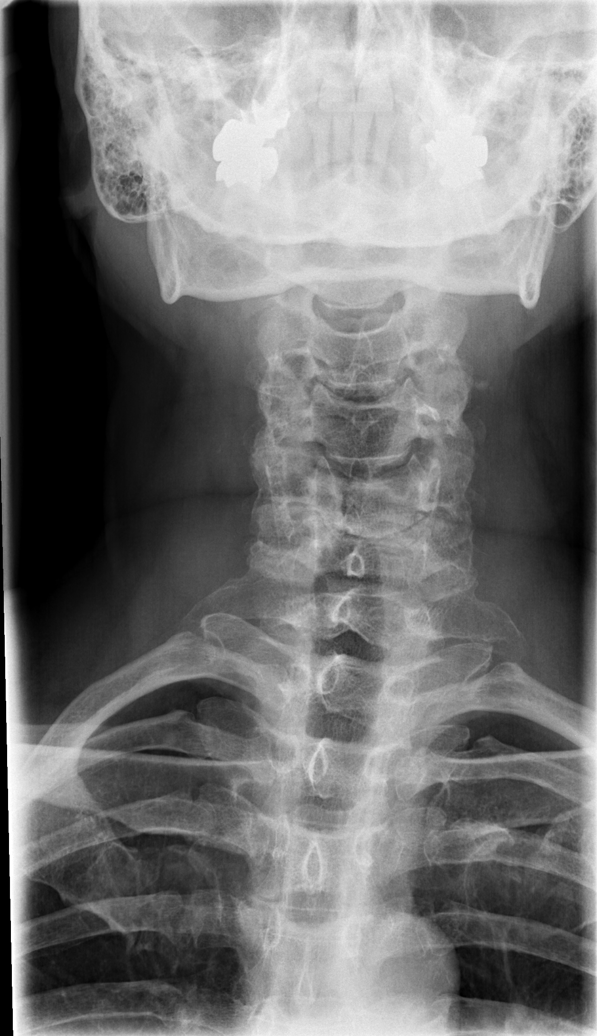

[w c-spine odontoid (1 of 2)]
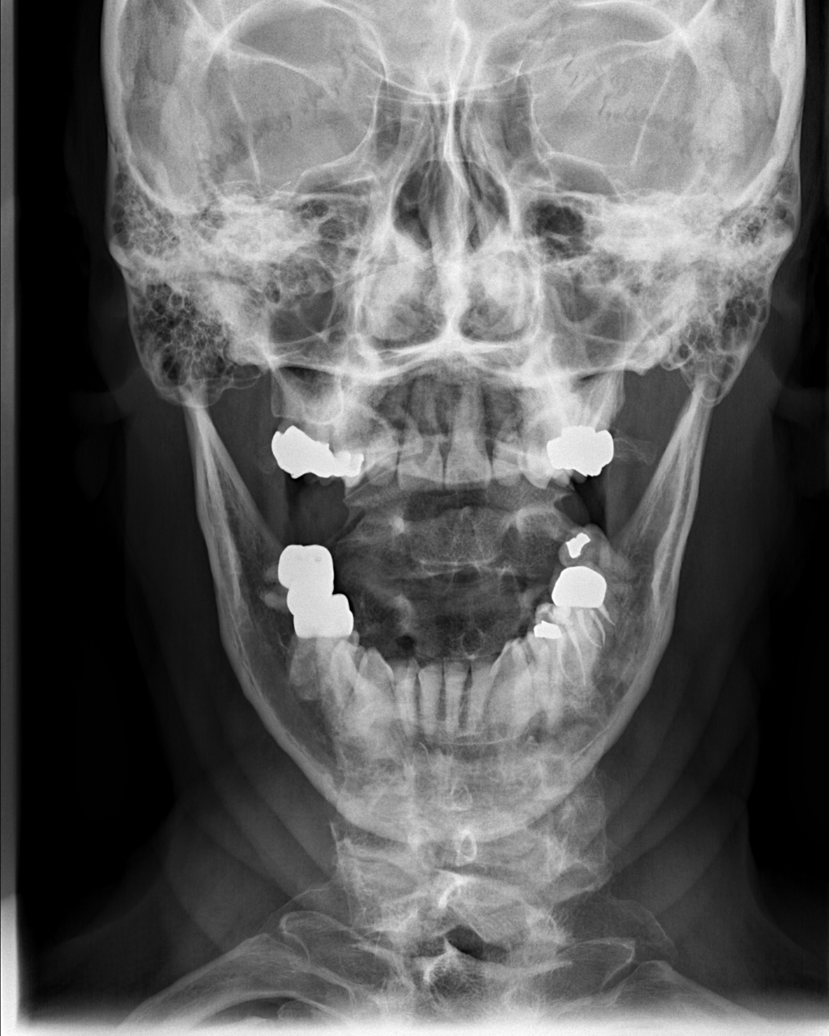

[w c-spine odontoid (2 of 2)]
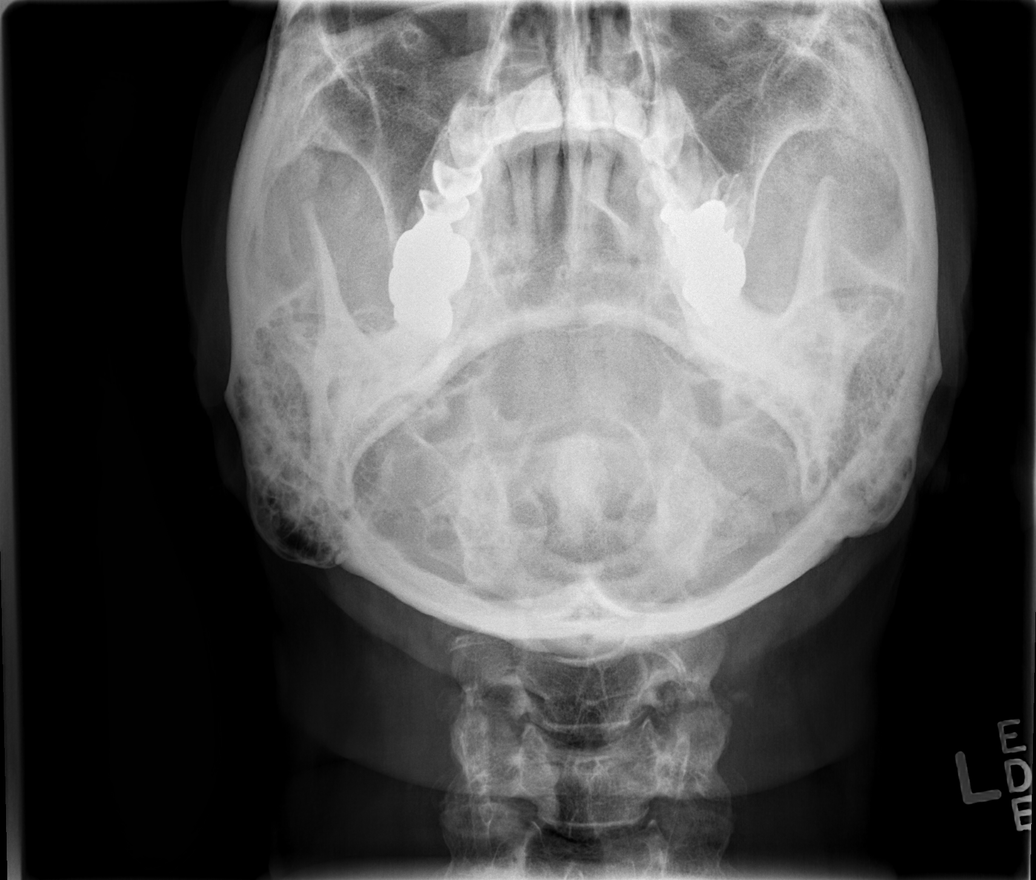

[w swimmers view]
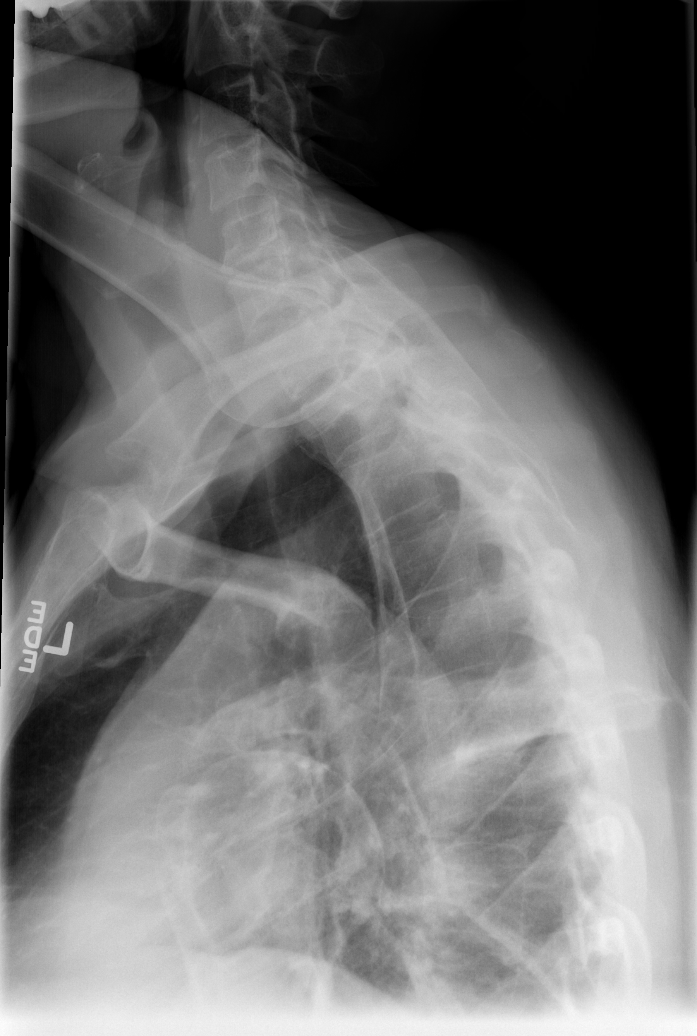

[5 of 5 positions shown; findings below may reference images not displayed]

FINDINGS: Normal alignment of the cervical spine. There is disc space
narrowing and ventral endplate spurring identified at the C5-6
level. No fracture or subluxation identified. There is no radiopaque
foreign bodies are soft tissue calcifications identified.
IMPRESSION: 1. C5-6 degenerative disc disease.

## 2014-12-04 DIAGNOSIS — H25813 Combined forms of age-related cataract, bilateral: Secondary | ICD-10-CM | POA: Diagnosis not present

## 2014-12-23 DIAGNOSIS — G4452 New daily persistent headache (NDPH): Secondary | ICD-10-CM | POA: Diagnosis not present

## 2015-01-07 DIAGNOSIS — H52202 Unspecified astigmatism, left eye: Secondary | ICD-10-CM | POA: Diagnosis not present

## 2015-01-07 DIAGNOSIS — H25812 Combined forms of age-related cataract, left eye: Secondary | ICD-10-CM | POA: Diagnosis not present

## 2015-01-17 ENCOUNTER — Encounter: Payer: Self-pay | Admitting: Cardiovascular Disease

## 2015-02-22 ENCOUNTER — Encounter: Payer: Self-pay | Admitting: Family Medicine

## 2015-02-23 NOTE — Telephone Encounter (Signed)
Perfect-Suandrea thanks for your help, I could not remember the resolution.  Bevelyn Ngo- can you call and let patient know he has an appointment on September 6th or message him through [place]. Our primary focus will be to do his health maintenance as required by a physical. I will attempt to review his full history verbally (I tend to discuss each issue instead of just assuming its accuracy). If there are any acute needs outside of the above, I will have him schedule a follow up visit.

## 2015-03-02 ENCOUNTER — Other Ambulatory Visit: Payer: Self-pay | Admitting: *Deleted

## 2015-03-02 ENCOUNTER — Other Ambulatory Visit: Payer: Self-pay | Admitting: Cardiovascular Disease

## 2015-03-02 ENCOUNTER — Telehealth: Payer: Self-pay | Admitting: *Deleted

## 2015-03-02 MED ORDER — ALPRAZOLAM 0.5 MG PO TABS: ORAL_TABLET | ORAL | Status: DC

## 2015-03-02 NOTE — Telephone Encounter (Signed)
Pharmacy [facility] sending efax for controlled rx refill. Called approval per Dr Angelena Form for ALPRAZolam 0.5 mg tablet, one tablet by mouth two times daily as needed for anxiety. Qty of 30 with 0 RF.

## 2015-03-02 NOTE — Telephone Encounter (Signed)
Ok to refill. CDM

## 2015-03-05 ENCOUNTER — Other Ambulatory Visit: Payer: Self-pay | Admitting: Family Medicine

## 2015-03-09 ENCOUNTER — Other Ambulatory Visit: Payer: Self-pay | Admitting: Cardiovascular Disease

## 2015-03-09 NOTE — Telephone Encounter (Signed)
OK to refill. cdm 

## 2015-03-11 ENCOUNTER — Other Ambulatory Visit: Payer: Self-pay | Admitting: Cardiovascular Disease

## 2015-03-11 DIAGNOSIS — C61 Malignant neoplasm of prostate: Secondary | ICD-10-CM | POA: Diagnosis not present

## 2015-03-11 NOTE — Telephone Encounter (Signed)
Prescription was refilled via phone call on August 22,2016. (see phone note). Cannot be refilled at this time.

## 2015-03-12 ENCOUNTER — Encounter: Payer: Self-pay | Admitting: Family Medicine

## 2015-03-17 ENCOUNTER — Ambulatory Visit: Payer: PRIVATE HEALTH INSURANCE | Admitting: Family Medicine

## 2015-03-17 ENCOUNTER — Encounter: Payer: Self-pay | Admitting: Family Medicine

## 2015-03-17 ENCOUNTER — Ambulatory Visit (INDEPENDENT_AMBULATORY_CARE_PROVIDER_SITE_OTHER): Payer: PRIVATE HEALTH INSURANCE | Admitting: Family Medicine

## 2015-03-17 VITALS — BP 130/80 | HR 71 | Temp 98.4°F | Ht 77.0 in | Wt 198.0 lb

## 2015-03-17 DIAGNOSIS — Z23 Encounter for immunization: Secondary | ICD-10-CM

## 2015-03-17 DIAGNOSIS — M5481 Occipital neuralgia: Secondary | ICD-10-CM

## 2015-03-17 DIAGNOSIS — Z0000 Encounter for general adult medical examination without abnormal findings: Secondary | ICD-10-CM | POA: Diagnosis not present

## 2015-03-17 DIAGNOSIS — I2510 Atherosclerotic heart disease of native coronary artery without angina pectoris: Secondary | ICD-10-CM

## 2015-03-17 DIAGNOSIS — E785 Hyperlipidemia, unspecified: Secondary | ICD-10-CM

## 2015-03-17 DIAGNOSIS — K219 Gastro-esophageal reflux disease without esophagitis: Secondary | ICD-10-CM | POA: Insufficient documentation

## 2015-03-17 LAB — CBC
HCT: 39.4 % (ref 39.0–52.0)
HEMOGLOBIN: 13.1 g/dL (ref 13.0–17.0)
MCHC: 33.4 g/dL (ref 30.0–36.0)
MCV: 91.8 fl (ref 78.0–100.0)
PLATELETS: 206 10*3/uL (ref 150.0–400.0)
RBC: 4.29 Mil/uL (ref 4.22–5.81)
RDW: 13 % (ref 11.5–15.5)
WBC: 4.6 10*3/uL (ref 4.0–10.5)

## 2015-03-17 LAB — LIPID PANEL
Cholesterol: 145 mg/dL (ref 0–200)
HDL: 43.8 mg/dL (ref >39.00)
LDL CALC: 82 mg/dL (ref 0–99)
NONHDL: 101.57
Total CHOL/HDL Ratio: 3
Triglycerides: 96 mg/dL (ref 0.0–149.0)
VLDL: 19.2 mg/dL (ref 0.0–40.0)

## 2015-03-17 LAB — TSH: TSH: 2.68 u[IU]/mL (ref 0.35–4.50)

## 2015-03-17 LAB — COMPREHENSIVE METABOLIC PANEL
ALK PHOS: 55 U/L (ref 39–117)
ALT: 22 U/L (ref 0–53)
AST: 25 U/L (ref 0–37)
Albumin: 4 g/dL (ref 3.5–5.2)
BUN: 26 mg/dL — AB (ref 6–23)
CALCIUM: 9.1 mg/dL (ref 8.4–10.5)
CHLORIDE: 106 meq/L (ref 96–112)
CO2: 29 meq/L (ref 19–32)
CREATININE: 1.06 mg/dL (ref 0.40–1.50)
GFR: 73.09 mL/min (ref >60.00)
GLUCOSE: 99 mg/dL (ref 70–99)
Potassium: 5 mEq/L (ref 3.5–5.1)
Sodium: 141 mEq/L (ref 135–145)
Total Bilirubin: 0.7 mg/dL (ref 0.2–1.2)
Total Protein: 6.8 g/dL (ref 6.0–8.3)

## 2015-03-17 NOTE — Patient Instructions (Signed)
Medication Instructions:  No changes  Labwork: Fasting bloodwork before you leave  Testing/Procedures/Immunizations: Thanks for getting your flu shot  Follow-Up (all visit scheduling, rescheduling, cancellations including labs should be scheduled at front desk): 1 year for physical, happy to see you in 6 months for check in.   Sooner if you need [place] or if you have new or worsening symptoms

## 2015-03-17 NOTE — Assessment & Plan Note (Signed)
S: controlled. previously A/P:Continue current meds. Check lipids today, LDL goal 70.

## 2015-03-17 NOTE — Progress Notes (Signed)
Garret Reddish, MD Phone: [phone]  Subjective:  Patient presents today for their annual physical. Chief complaint-noted.   Doing well. Walking 5 days a week. Doing well post prostate cancer with radical prostatectomy with last PSA 0- awaiting most recent results - ROS- full  review of systems was completed and negative. Pertinent negatives- no chest pain, shortness of breath, blurry vision, fatigue, nausea.   The following were reviewed and entered/updated in epic: Past Medical History  Diagnosis Date  . Nephrolithiasis   . Coronary artery disease     a. ETT 9/13: + ischemic ECG changes;  b. ETT-MV 9/13: EF 62%;  + ischemic EKG changes and + inf ischemia;  c. LHC 9/13: pLAD 30%, mLAD 70-80%, oDx 40-50% ==> FFR hemodynamically significant stenosis in mLAD ==> PCI: Xience Xpedition DES to mLAD; Cath 06/01/12   single vessel CAD w/ patent mid LAD stent and normal LV systolic function  . GERD (gastroesophageal reflux disease)   . Hyperlipidemia LDL goal < 70   . Fibromyalgia     muscle aches in past in both forearms, before statin even  . Testosterone deficiency 04/28/2008    History treatment around 2009. No current treatment.      Patient Active Problem List   Diagnosis Date Noted  . Prostate cancer 08/18/2014    Priority: High  . Coronary atherosclerosis of native coronary artery 04/12/2012    Priority: High  . Bilateral occipital neuralgia 01/22/2014    Priority: Medium  . Hyperlipidemia 04/25/2012    Priority: Medium  . GERD (gastroesophageal reflux disease) 03/17/2015    Priority: Low  . Insomnia 04/22/2010    Priority: Low  . NEPHROLITHIASIS, HX OF 03/26/2008    Priority: Low   Past Surgical History  Procedure Laterality Date  . Inguinal hernia repair  1990's    right  . Knee arthroscopy  1990's    right  . Cataract extraction w/ intraocular lens implant  08/2011    right .Left 12/2014.   . Cardiac catheterization  04/06/2012; 06/01/2012  . Coronary angioplasty  with stent placement  04/11/2012    DES LAD  . Cystoscopy w/ stone manipulation  2009  . Percutaneous coronary stent intervention (pci-s) N/A 04/11/2012    Procedure: PERCUTANEOUS CORONARY STENT INTERVENTION (PCI-S);  Surgeon: Burnell Blanks, MD;  Location: [facility] CATH LAB;  Service: Cardiovascular;  Laterality: N/A;  . Left heart catheterization with coronary angiogram N/A 06/01/2012    Procedure: LEFT HEART CATHETERIZATION WITH CORONARY ANGIOGRAM;  Surgeon: Burnell Blanks, MD;  Location: [facility] CATH LAB;  Service: Cardiovascular;  Laterality: N/A;  . Robot assisted laparoscopic radical prostatectomy N/A 08/18/2014    Procedure: ROBOTIC ASSISTED LAPAROSCOPIC RADICAL PROSTATECTOMY LEVEL 2;  Surgeon: Raynelle Bring, MD;  Location: [facility] ORS;  Service: Urology;  Laterality: N/A;  . Lymphadenectomy Bilateral 08/18/2014    Procedure: LYMPHADENECTOMY;  Surgeon: Raynelle Bring, MD;  Location: [facility] ORS;  Service: Urology;  Laterality: Bilateral;    Family History  Problem Relation Age of Onset  . Diabetes Mother   . Heart attack Mother 77  . Diabetes Father   . Heart attack Father 77  . Diabetes Sister   . Arthritis Brother     Medications- reviewed and updated Current Outpatient Prescriptions  Medication Sig Dispense Refill  . aspirin EC 81 MG tablet Take 81 mg by mouth at bedtime.    Marland Kitchen atorvastatin (LIPITOR) 10 MG tablet Take 1 tablet (10 mg total) by mouth daily. 90 tablet 3  . clopidogrel (PLAVIX)  75 MG tablet Take 1 tablet (75 mg total) by mouth daily. 90 tablet 3  . pantoprazole (PROTONIX) 40 MG tablet TAKE ONE TABLET BY MOUTH EVERY MORNING 90 tablet 3  . polyvinyl alcohol (LIQUIFILM TEARS) 1.4 % ophthalmic solution Place 1 drop into both eyes daily as needed for dry eyes.    . sodium chloride (OCEAN) 0.65 % SOLN nasal spray Place 1 spray into both nostrils daily as needed for congestion.    Marland Kitchen ALPRAZolam (XANAX) 0.5 MG tablet TAKE ONE TABLET (0.5 MG) BY MOUTH TWO TIMES DAILY AS NEEDED FOR  ANXIETY (Patient not taking: Reported on 03/17/2015) 30 tablet 0  . nitroGLYCERIN (NITROSTAT) 0.4 MG SL tablet Place 1 tablet (0.4 mg total) under the tongue every 5 (five) minutes as needed for chest pain. (Patient not taking: Reported on 03/17/2015) 25 tablet 6   Allergies-reviewed and updated Allergies  Allergen Reactions  . Sulfa Antibiotics Rash    rash    Social History   Social History  . Marital Status: Married    Spouse Name: N/A  . Number of Children: 2  . Years of Education: N/A   Occupational History  . Retired    Social History Main Topics  . Smoking status: Never Smoker   . Smokeless tobacco: Never Used  . Alcohol Use: No  . Drug Use: No  . Sexual Activity: Not Currently   Other Topics Concern  . None   Social History Narrative   Married (brother patient here, wife patient elsewhere). 2 children (daughter in [place], son in [place] with [employer]).       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 Oropharynx normal Neck: no thyromegaly CV: RRR no murmurs rubs or gallops Lungs: CTAB no crackles, wheeze, rhonchi Abdomen: soft/nontender/nondistended/normal bowel sounds. No rebound or guarding.  Rectal deferred as has this with Dr. Alinda Money Ext: no edema, 2+ PT pulses Skin: warm, dry, no rash, no obvious AK, BCC, SCC, or melanoma.  Neuro: grossly normal, moves all extremities, PERRLA   Assessment/Plan:  69 y.o. male presenting for annual physical.  Health Maintenance counseling: 1. Anticipatory guidance: Patient counseled regarding regular dental exams, wearing seatbelts, wearing sunscreen 2. Risk factor reduction:  Advised patient of  need for regular exercise and diet rich and fruits and vegetables to reduce risk of heart attack and stroke. Walks 5 days a week 30 minutes.  3. Immunizations/screenings/ancillary studies Health Maintenance Due  Topic Date Due  . Hepatitis C Screening - declined 10-27-1945  4. Prostate cancer screening- has had prostate cancer, active surveillance by Dr. Alinda Money 5. Colon cancer screening - 09/12/2007 hyperplastic polyp- 10 year repeat  6. Skin cancer screening- does not see derm  Prostate cancer 08/2014 Radical Prostatectomy. [facility] Dr. Alinda Money. Just had PSA, awaiting results. No rectal needed  Coronary atherosclerosis of native coronary artery S: controlled. On aspirin and plavix. Dual therapy advised by cardiology. Lipids controlled previously on atorvastatin. Visit in November with cards.  A/P:Continue current meds, patient will return in 1 year but given strict return precautions for any cardiac symptoms   Hyperlipidemia S: controlled. previously A/P:Continue current meds. Check lipids today,  LDL goal 70.    Bilateral occipital neuralgia S:Neck pain, headaches, saw Dr. Domingo Cocking and Dr. Delice Lesch. Was on TCA- stopped on his own. Had botox- mildly better. Having headaches at least 18 months. Has had imaging.  A/P: currently just living with headaches, does not desire therapy- SE too strong per patient    1 year CPE, 6 months for check in if desired Patient states he has contacted his insurance and was told physical would be covered within month of last years physical- last physical was 04/15/14. He would still like [place] to bill as a physical. This could be billed as 99214 if insurance does not cover CPE.   Fasting  Orders Placed This Encounter  Procedures  . Flu Vaccine QUAD 36+ mos IM  . CBC    [place]  . Comprehensive metabolic panel    [place]    Order Specific Question:  Has the patient fasted?    Answer:  No  . Lipid panel    [place]    Order Specific Question:  Has  the patient fasted?    Answer:  No  . TSH    [place]

## 2015-03-17 NOTE — Assessment & Plan Note (Signed)
08/2014 Radical Prostatectomy. [facility] Dr. Alinda Money. Just had PSA, awaiting results. No rectal needed

## 2015-03-17 NOTE — Assessment & Plan Note (Signed)
S: controlled. On aspirin and plavix. Dual therapy advised by cardiology. Lipids controlled previously on atorvastatin. Visit in November with cards.  A/P:Continue current meds, patient will return in 1 year but given strict return precautions for any cardiac symptoms

## 2015-03-17 NOTE — Assessment & Plan Note (Signed)
S:Neck pain, headaches, saw Dr. Domingo Cocking and Dr. Delice Lesch. Was on TCA- stopped on his own. Had botox- mildly better. Having headaches at least 18 months. Has had imaging.  A/P: currently just living with headaches, does not desire therapy- SE too strong per patient

## 2015-04-07 DIAGNOSIS — L600 Ingrowing nail: Secondary | ICD-10-CM | POA: Diagnosis not present

## 2015-06-01 ENCOUNTER — Encounter: Payer: Self-pay | Admitting: Family Medicine

## 2015-06-01 MED ORDER — GLYCERIN (LAXATIVE) 2 G RE SUPP: 1.0000 | Freq: Once | RECTAL | Status: DC

## 2015-06-09 NOTE — Progress Notes (Addendum)
Chief Complaint  Patient presents with  . Follow-up    1 YEAR CHECK UP. PT HAS NO COMPLAINTS     History of Present Illness: 69 yo male with history of CAD, kidney stones, GERD here today for cardiac follow up. Initially seen in 2013 after abnormal stress test. Stress myoview 04/02/12 with ST depression at peak exercise and inferior wall defect concerning for ischemia. Cardiac cath on 04/06/12 with moderately severe mid LAD lesion involving the diagonal branch. He was brought back for PCI on 04/11/12 and FFR of the LAD was 0.77. A Xience Expedition (2.75 mm x 23 mm) drug eluting stent was placed in the mid LAD and angioplasty was performed on the ostium of the diagonal. Recurrent chest pain November 2013 with cardiac cath 06/01/12 demonstrating stable disease with patency of LAD stent. He has tolerated Lipitor 20 mg every other day.   He is here today for cardiac follow up. He has been doing well. No chest pain or SOB. He has been active. He has been exercising every day and playing golf.    Primary Care Physician: Garret Reddish, MD  Past Medical History  Diagnosis Date  . Nephrolithiasis   . Coronary artery disease     a. ETT 9/13: + ischemic ECG changes;  b. ETT-MV 9/13: EF 62%;  + ischemic EKG changes and + inf ischemia;  c. LHC 9/13: pLAD 30%, mLAD 70-80%, oDx 40-50% ==> FFR hemodynamically significant stenosis in mLAD ==> PCI: Xience Xpedition DES to mLAD; Cath 06/01/12   single vessel CAD w/ patent mid LAD stent and normal LV systolic function  . GERD (gastroesophageal reflux disease)   . Hyperlipidemia LDL goal < 70   . Fibromyalgia     muscle aches in past in both forearms, before statin even  . Testosterone deficiency 04/28/2008    History treatment around 2009. No current treatment.       Past Surgical History  Procedure Laterality Date  . Inguinal hernia repair  1990's    right  . Knee arthroscopy  1990's    right  . Cataract extraction w/ intraocular lens implant   08/2011    right .Left 12/2014.   . Cardiac catheterization  04/06/2012; 06/01/2012  . Coronary angioplasty with stent placement  04/11/2012    DES LAD  . Cystoscopy w/ stone manipulation  2009  . Percutaneous coronary stent intervention (pci-s) N/A 04/11/2012    Procedure: PERCUTANEOUS CORONARY STENT INTERVENTION (PCI-S);  Surgeon: Burnell Blanks, MD;  Location: [facility] CATH LAB;  Service: Cardiovascular;  Laterality: N/A;  . Left heart catheterization with coronary angiogram N/A 06/01/2012    Procedure: LEFT HEART CATHETERIZATION WITH CORONARY ANGIOGRAM;  Surgeon: Burnell Blanks, MD;  Location: [facility] CATH LAB;  Service: Cardiovascular;  Laterality: N/A;  . Robot assisted laparoscopic radical prostatectomy N/A 08/18/2014    Procedure: ROBOTIC ASSISTED LAPAROSCOPIC RADICAL PROSTATECTOMY LEVEL 2;  Surgeon: Raynelle Bring, MD;  Location: [facility] ORS;  Service: Urology;  Laterality: N/A;  . Lymphadenectomy Bilateral 08/18/2014    Procedure: LYMPHADENECTOMY;  Surgeon: Raynelle Bring, MD;  Location: [facility] ORS;  Service: Urology;  Laterality: Bilateral;    Current Outpatient Prescriptions  Medication Sig Dispense Refill  . ALPRAZolam (XANAX) 0.5 MG tablet TAKE ONE TABLET (0.5 MG) BY MOUTH TWO TIMES DAILY AS NEEDED FOR ANXIETY 30 tablet 0  . aspirin EC 81 MG tablet Take 81 mg by mouth at bedtime.    Marland Kitchen atorvastatin (LIPITOR) 10 MG tablet Take 1 tablet (10 mg  total) by mouth daily. 90 tablet 3  . clopidogrel (PLAVIX) 75 MG tablet Take 1 tablet (75 mg total) by mouth daily. 90 tablet 3  . glycerin adult (GLYCERIN ADULT) 2 G SUPP Place 1 suppository rectally once. 10 each 0  . nitroGLYCERIN (NITROSTAT) 0.4 MG SL tablet Place 1 tablet (0.4 mg total) under the tongue every 5 (five) minutes as needed for chest pain. 25 tablet 6  . pantoprazole (PROTONIX) 40 MG tablet TAKE ONE TABLET BY MOUTH EVERY MORNING 90 tablet 3  . polyvinyl alcohol (LIQUIFILM TEARS) 1.4 % ophthalmic solution Place 1 drop into both eyes daily  as needed for dry eyes.    . sodium chloride (OCEAN) 0.65 % SOLN nasal spray Place 1 spray into both nostrils daily as needed for congestion.     No current facility-administered medications for this visit.    Allergies  Allergen Reactions  . Sulfa Antibiotics Rash    rash    Social History   Social History  . Marital Status: Married    Spouse Name: N/A  . Number of Children: 2  . Years of Education: N/A   Occupational History  . Retired    Social History Main Topics  . Smoking status: Never Smoker   . Smokeless tobacco: Never Used  . Alcohol Use: No  . Drug Use: No  . Sexual Activity: Not Currently   Other Topics Concern  . Not on file   Social History Narrative   Married (brother patient here, wife patient elsewhere). 2 children (daughter in [place], son in [place] with [employer]).       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 Diabetes Mother   . Heart attack Mother 15  . Diabetes Father   . Heart attack Father 47  . Diabetes Sister   . Arthritis Brother     Review of Systems:  As stated in the HPI and otherwise negative.   BP 110/78 mmHg  Pulse 62  Ht 6' (1.829 m)  Wt 198 lb (89.812 kg)  BMI 26.85 kg/m2  SpO2 95%  Physical Examination: General: Well developed, well nourished, NAD HEENT: OP clear, mucus membranes moist SKIN: warm, dry. No rashes. Neuro: No focal deficits Musculoskeletal: Muscle strength 5/5 all ext Psychiatric: Mood and affect normal Neck: No JVD, no carotid bruits, no thyromegaly, no lymphadenopathy. Lungs:Clear bilaterally, no wheezes, rhonci, crackles Cardiovascular: Regular rate and rhythm. No murmurs, gallops or rubs. Abdomen:Soft. Bowel sounds present. Non-tender.  Extremities: No lower extremity edema. Pulses are 2 + in the bilateral DP/PT.  Cardiac cath  06/01/12:  Left main: No obstructive disease noted.  Left Anterior Descending Artery: Moderate caliber vessel that courses to the apex. The proximal vessel has a 30% stenosis. The mid vessel has a patent stented segment with no evidence of restenosis. The distal LAD has no obstructive disease. The diagonal branch is covered by the LAD stent and has a 10% ostial stenosis. There is excellent flow down the diagonal branch.  Intermediate Artery: Small caliber vessel with 30% stenosis.  Circumflex Artery: Large caliber, co-dominant vessel with no obstructive disease noted. There are three small caliber obtuse marginal branches that have no obstructive disease noted.  Right Coronary Artery: Small caliber, co-dominant vessel with mild plaque in the proximal and mid segments.  Left Ventricular Angiogram: LVEF=60-65%  EKG:  EKG is ordered today. The ekg ordered today demonstrates NSR, rate 62 bpm.   Recent Labs: 03/17/2015: ALT 22; BUN 26*; Creatinine, Ser 1.06; Hemoglobin 13.1; Platelets 206.0; Potassium 5.0; Sodium 141; TSH 2.68   Lipid Panel    Component Value Date/Time   CHOL 145 03/17/2015 1109   TRIG 96.0 03/17/2015 1109   HDL 43.80 03/17/2015 1109   CHOLHDL 3 03/17/2015 1109   VLDL 19.2 03/17/2015 1109   LDLCALC 82 03/17/2015 1109   LDLDIRECT 52 06/02/2012 1050     Wt Readings from Last 3 Encounters:  06/10/15 198 lb (89.812 kg)  03/17/15 198 lb (89.812 kg)  08/18/14 199 lb 3 oz (90.351 kg)     Other studies Reviewed: Additional studies/ records that were reviewed today include: . Review of the above records demonstrates:    Assessment and Plan:   1. CAD: He is doing well. No recent chest pain. Continue ASA and Plavix. No beta blocker with bradycardia. Xanax prn anxiety. Will arrange exercise stress test since he has had no ischemic testing since 2013.   2.GERD: Continue PPI  3. HLD: Continue statin. Lipids well controlled.   Current medicines are reviewed at length with the  patient today.  The patient does not have concerns regarding medicines.  The following changes have been made:  no change  Labs/ tests ordered today include:   Orders Placed This Encounter  Procedures  . Exercise Tolerance Test  . EKG 12-Lead     Disposition:   FU with me in 12  months   Signed, Lauree Chandler, MD 06/10/2015 11:45 AM    [facility] [place], [place], [place]  [address] Phone: [phone]; Fax: [phone]

## 2015-06-10 ENCOUNTER — Encounter: Payer: Self-pay | Admitting: Cardiovascular Disease

## 2015-06-10 ENCOUNTER — Ambulatory Visit (INDEPENDENT_AMBULATORY_CARE_PROVIDER_SITE_OTHER): Payer: PRIVATE HEALTH INSURANCE | Admitting: Cardiovascular Disease

## 2015-06-10 VITALS — BP 110/78 | HR 62 | Ht 72.0 in | Wt 198.0 lb

## 2015-06-10 DIAGNOSIS — E785 Hyperlipidemia, unspecified: Secondary | ICD-10-CM

## 2015-06-10 DIAGNOSIS — I2510 Atherosclerotic heart disease of native coronary artery without angina pectoris: Secondary | ICD-10-CM | POA: Diagnosis not present

## 2015-06-10 NOTE — Patient Instructions (Signed)
Medication Instructions:  Your physician recommends that you continue on your current medications as directed. Please refer to the Current Medication list given to you today.   Labwork: none  Testing/Procedures: Your physician has requested that you have an exercise tolerance test. For further information please visit [email or web address]. Please also follow instruction sheet, as given.    Follow-Up: Your physician wants you to follow-up in: 12 months.  You will receive a reminder letter in the mail two months in advance. If you don't receive a letter, please call our office to schedule the follow-up appointment.   Any Other Special Instructions Will Be Listed Below (If Applicable).     If you need a refill on your cardiac medications before your next appointment, please call your pharmacy.   

## 2015-06-29 ENCOUNTER — Encounter: Payer: Self-pay | Admitting: Family Medicine

## 2015-06-30 NOTE — Telephone Encounter (Signed)
You can schedule him for this along with labs. Is he on medicare? I will have to tell you lab orders if so. It is not showing up in mychart.   I am concerned that insurance will in fact cover a physical when just had one in September or labs to go along with it though so make sure he is aware of that concern

## 2015-06-30 NOTE — Telephone Encounter (Signed)
Ok can order CBC, CMET, direct ldl, hdl, cholesterol under hyperlipidemia.   Can schedule him for visit. I am still concerned about insurance covering this visit but he should check with them about this being called a physical. Is he plain medicare? If so it is AWV

## 2015-07-02 ENCOUNTER — Other Ambulatory Visit: Payer: Self-pay | Admitting: Family Medicine

## 2015-07-02 DIAGNOSIS — E785 Hyperlipidemia, unspecified: Secondary | ICD-10-CM

## 2015-08-11 ENCOUNTER — Encounter: Payer: Self-pay | Admitting: Cardiovascular Disease

## 2015-08-12 ENCOUNTER — Telehealth: Payer: Self-pay | Admitting: *Deleted

## 2015-08-12 NOTE — Telephone Encounter (Signed)
Pt would like to have stress test done at [facility] (see my chart message) This is OK with Dr. Angelena Form.  I spoke with staff at [facility] [phone]) and this has to be arranged through scheduling department.  I called scheduling department and left message on voicemail to call me back to schedule test.

## 2015-08-14 ENCOUNTER — Other Ambulatory Visit: Payer: Self-pay | Admitting: Cardiovascular Disease

## 2015-08-14 DIAGNOSIS — E785 Hyperlipidemia, unspecified: Secondary | ICD-10-CM

## 2015-08-14 MED ORDER — ATORVASTATIN CALCIUM 10 MG PO TABS: 10.0000 mg | ORAL_TABLET | Freq: Every day | ORAL | Status: DC

## 2015-08-14 MED ORDER — CLOPIDOGREL BISULFATE 75 MG PO TABS: 75.0000 mg | ORAL_TABLET | Freq: Every day | ORAL | Status: DC

## 2015-08-18 ENCOUNTER — Encounter: Payer: PRIVATE HEALTH INSURANCE | Admitting: Physician Assistant

## 2015-08-19 ENCOUNTER — Encounter: Payer: Self-pay | Admitting: Cardiovascular Disease

## 2015-08-20 NOTE — Telephone Encounter (Signed)
Spoke with scheduling at [facility] [phone]) and GXT scheduled for 08/26/15 at 9:00. Pt to arrive at 8:30.  He should be npo after midnight.  Order faxed to [phone].  I spoke with pt and gave him appt information and contact phone number for [facility] scheduling.

## 2015-08-26 DIAGNOSIS — I2510 Atherosclerotic heart disease of native coronary artery without angina pectoris: Secondary | ICD-10-CM | POA: Diagnosis not present

## 2015-09-09 ENCOUNTER — Encounter: Payer: Self-pay | Admitting: Cardiovascular Disease

## 2015-09-09 ENCOUNTER — Encounter: Payer: Self-pay | Admitting: Family Medicine

## 2015-09-20 ENCOUNTER — Encounter: Payer: Self-pay | Admitting: Family Medicine

## 2015-09-22 ENCOUNTER — Ambulatory Visit (INDEPENDENT_AMBULATORY_CARE_PROVIDER_SITE_OTHER): Payer: PRIVATE HEALTH INSURANCE | Admitting: Family Medicine

## 2015-09-22 ENCOUNTER — Encounter: Payer: Self-pay | Admitting: Family Medicine

## 2015-09-22 VITALS — BP 124/80 | HR 65 | Temp 98.5°F | Wt 196.0 lb

## 2015-09-22 DIAGNOSIS — M4692 Unspecified inflammatory spondylopathy, cervical region: Secondary | ICD-10-CM | POA: Diagnosis not present

## 2015-09-22 DIAGNOSIS — R51 Headache: Secondary | ICD-10-CM | POA: Diagnosis not present

## 2015-09-22 DIAGNOSIS — M47812 Spondylosis without myelopathy or radiculopathy, cervical region: Secondary | ICD-10-CM

## 2015-09-22 DIAGNOSIS — G8929 Other chronic pain: Secondary | ICD-10-CM

## 2015-09-22 DIAGNOSIS — R519 Headache, unspecified: Secondary | ICD-10-CM

## 2015-09-22 LAB — C-REACTIVE PROTEIN: CRP: 0.2 mg/dL — AB (ref 0.5–20.0)

## 2015-09-22 LAB — SEDIMENTATION RATE: SED RATE: 6 mm/h (ref 0–22)

## 2015-09-22 NOTE — Patient Instructions (Signed)
Let's get an ESR and CRP - if these are not elevated likelihood of temporal arteritis is high  I wonder if this could be more from cervical arthritis. We may consider option such as Dr. Tomi Likens with our neurology group or [facility] if headache returns off prednisone

## 2015-09-22 NOTE — Progress Notes (Signed)
Garret Reddish, MD  Subjective:  Richard Mahoney is a 70 y.o. year old very pleasant male patient who presents for/with See problem oriented charting ROS- see ROS below  Past Medical History-  Patient Active Problem List   Diagnosis Date Noted  . Prostate cancer ([place]) 08/18/2014    Priority: High  . Coronary atherosclerosis of native coronary artery 04/12/2012    Priority: High  . Bilateral occipital neuralgia 01/22/2014    Priority: Medium  . Hyperlipidemia 04/25/2012    Priority: Medium  . GERD (gastroesophageal reflux disease) 03/17/2015    Priority: Low  . Insomnia 04/22/2010    Priority: Low  . NEPHROLITHIASIS, HX OF 03/26/2008    Priority: Low    Medications- reviewed and updated Current Outpatient Prescriptions  Medication Sig Dispense Refill  . aspirin EC 81 MG tablet Take 81 mg by mouth at bedtime.    Marland Kitchen atorvastatin (LIPITOR) 10 MG tablet Take 1 tablet (10 mg total) by mouth daily. 90 tablet 2  . clopidogrel (PLAVIX) 75 MG tablet Take 1 tablet (75 mg total) by mouth daily. 90 tablet 2  . pantoprazole (PROTONIX) 40 MG tablet TAKE ONE TABLET BY MOUTH EVERY MORNING 90 tablet 3  . polyvinyl alcohol (LIQUIFILM TEARS) 1.4 % ophthalmic solution Place 1 drop into both eyes daily as needed for dry eyes.    . sodium chloride (OCEAN) 0.65 % SOLN nasal spray Place 1 spray into both nostrils daily as needed for congestion.    Marland Kitchen ALPRAZolam (XANAX) 0.5 MG tablet TAKE ONE TABLET (0.5 MG) BY MOUTH TWO TIMES DAILY AS NEEDED FOR ANXIETY (Patient not taking: Reported on 09/22/2015) 30 tablet 0  . nitroGLYCERIN (NITROSTAT) 0.4 MG SL tablet Place 1 tablet (0.4 mg total) under the tongue every 5 (five) minutes as needed for chest pain. (Patient not taking: Reported on 09/22/2015) 25 tablet 6   No current facility-administered medications for this visit.    Objective: BP 124/80 mmHg  Pulse 65  Temp(Src) 98.5 F (36.9 C)  Wt 196 lb (88.905 kg) Gen: NAD, resting comfortably Loud popping  of jaw with opening wide CV: RRR no murmurs rubs or gallops Lungs: CTAB no crackles, wheeze, rhonchi Abdomen: soft/nontender/nondistended/normal bowel sounds. No rebound or guarding.  Ext: no edema Skin: warm, dry Neuro: CN II-XII intact, sensation and reflexes normal throughout, 5/5 muscle strength in bilateral upper and lower extremities. Normal finger to nose. Normal rapid alternating movements. No pronator drift. Normal romberg. Normal gait.   Assessment/Plan:  Chronic Headaches Cervical arthritis S: Dealing with chronic headaches for 4 years along with neck pain. Seen by Dr. Domingo Cocking and Dr. Delice Lesch in the past. Took TCA but stopped on own. Had botox- mild help. MRI brain through [place]. DG cervical spine from Dr. Delice Lesch showed c5-c6 DDD. Chiropractor and accupunturist have not helped. Headaches are from ear to back of head. Sometimes scalp is very tender. Sometimes mild frontal headaches. Goes down into neck on both sides. Has jaw pain as well from severe TMJ.   Developed a tooth ache on the left side while at beach recently but had recent films that were normal- took some old prednisone and within 2 days of 20 mg and had resolution of toothache- also had resolution of headache for first time in years. neck pain and stiffness much better (also chronic issue) but not total resolution.  Can turn neck very easily at this point. Talked to a dermatologist friend of his who suggested temporal arteritis given improvement on prednisone and encouraged  PCP follow up. He is on 2nd to last day of prednisone at present  ROS- No changes in vision recently. Only issue is focusing after reading for a while. No fatigue. No fever/chills  A/P: Patient is concerned about temporal arteritis. I told him I strongly doubted this with 4 years of symptoms despite prednisone improving symptoms. Will get Esr, crp. While awaiting results take 1/2 of last pill tomorrow and next day. Will likely stop once medicine runs out  if ESR/CRP normal. I suspect this is cervical in origin (only improved on prednisone though and never on nsaids). If headaches return shortly after stopping- would refer to neurology (asks for Dr. Tomi Likens or [facility]) . If headaches remain gone for months may just use prednisone for flares. If chronic recurring issues- refer to neurosurgery  Return precautions advised.   Orders Placed This Encounter  Procedures  . Sedimentation rate    [place]  . C-reactive Protein   The duration of face-to-face time during this visit was 25 minutes. Greater than 50% of this time was spent in counseling, explanation of diagnosis, planning of further management, and/or coordination of care.

## 2015-09-25 ENCOUNTER — Encounter: Payer: Self-pay | Admitting: Family Medicine

## 2015-09-30 DIAGNOSIS — C61 Malignant neoplasm of prostate: Secondary | ICD-10-CM | POA: Diagnosis not present

## 2015-09-30 DIAGNOSIS — N200 Calculus of kidney: Secondary | ICD-10-CM | POA: Diagnosis not present

## 2015-10-08 IMAGING — CR DG CHEST 2V
2 series · 2 of 2 positions shown · non-contrast
Comparison: January 25, 2007

CLINICAL DATA: Preoperative prostate carcinoma surgery

EXAM:
CHEST  2 VIEW

[w chest pa]
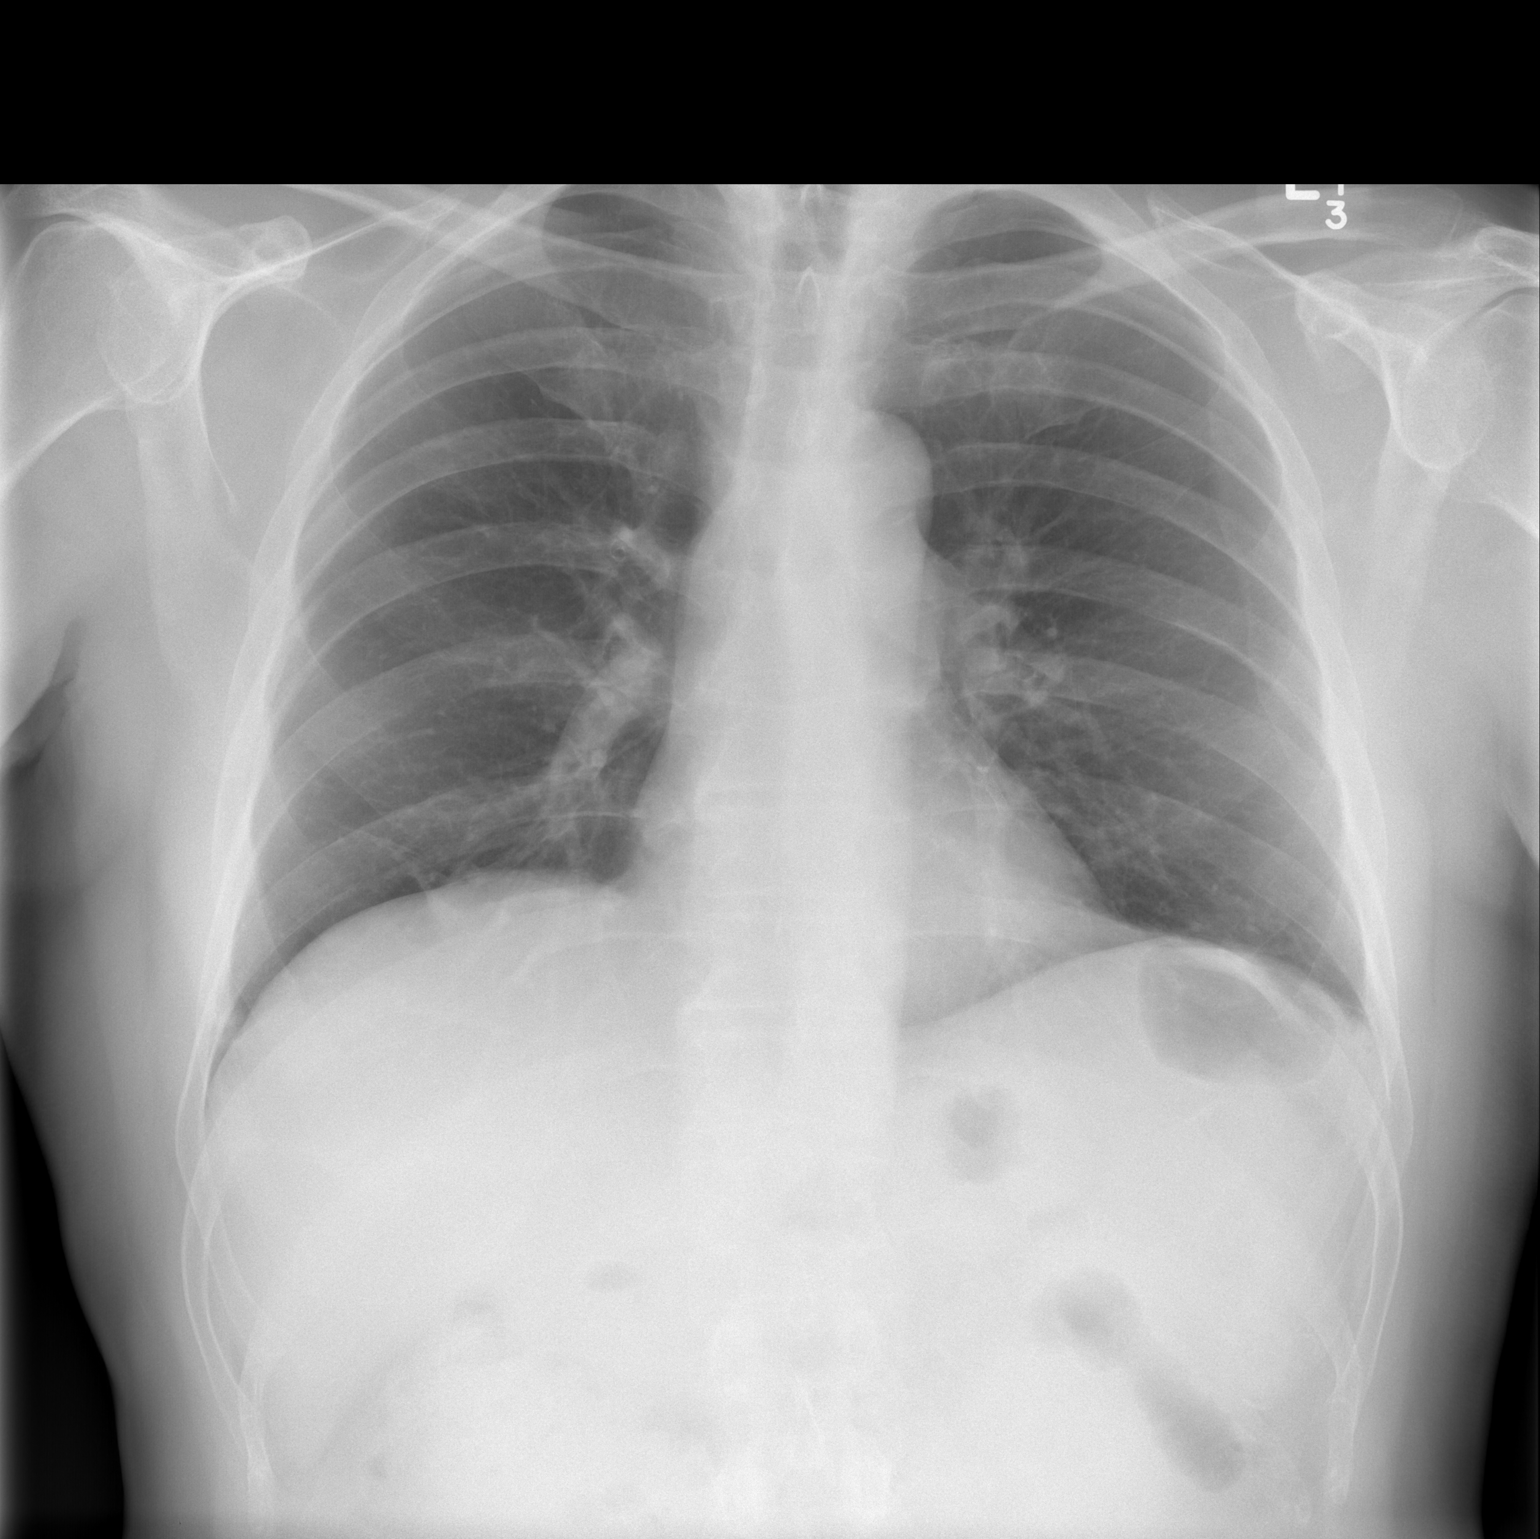

[w chest lat]
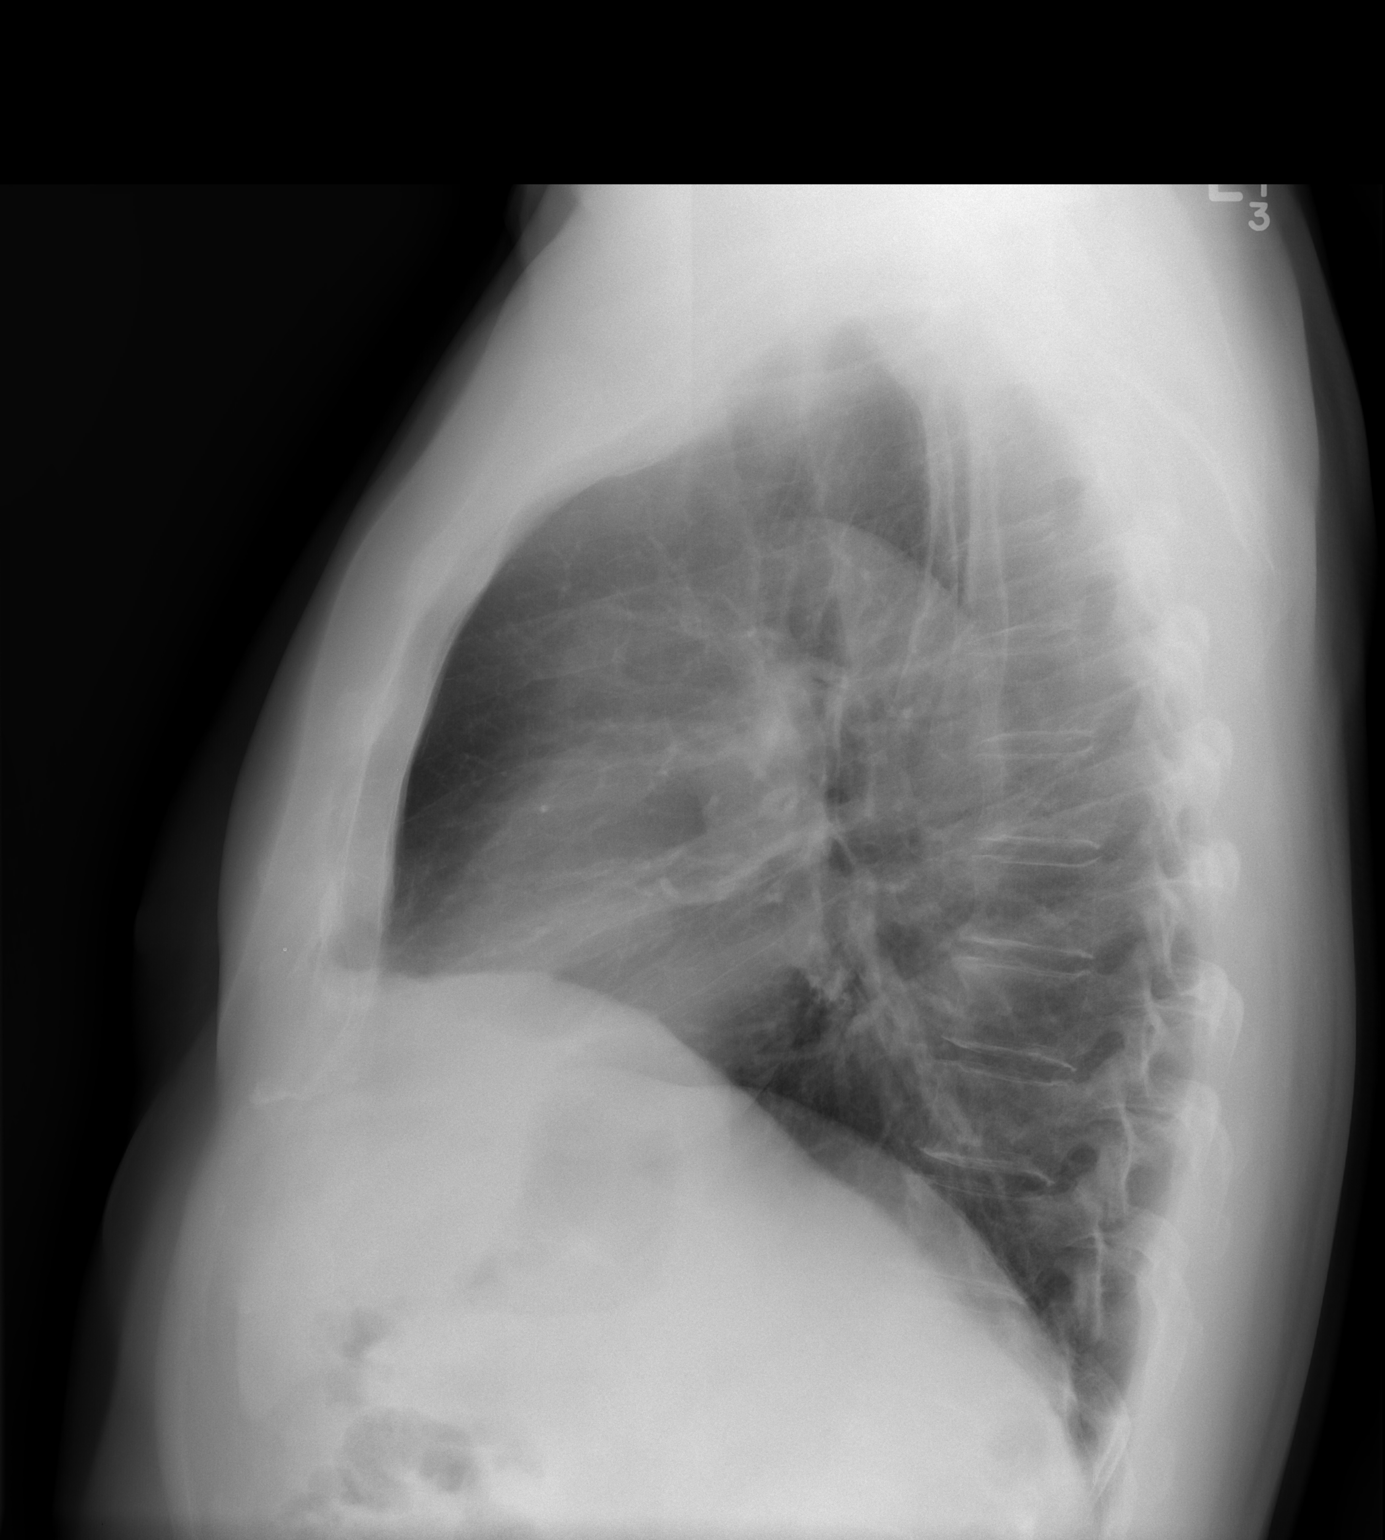

[2 of 2 positions shown; findings below may reference images not displayed]

FINDINGS: There is minimal scarring in the bases. There is no edema or
consolidation. No mass or adenopathy. No bone lesions. Heart size
and pulmonary vascularity are normal.
IMPRESSION: Minimal scarring in the bases.  No edema or consolidation.

## 2015-10-16 ENCOUNTER — Other Ambulatory Visit (INDEPENDENT_AMBULATORY_CARE_PROVIDER_SITE_OTHER): Payer: PRIVATE HEALTH INSURANCE

## 2015-10-16 DIAGNOSIS — E785 Hyperlipidemia, unspecified: Secondary | ICD-10-CM

## 2015-10-16 LAB — CBC
HCT: 41 % (ref 39.0–52.0)
Hemoglobin: 13.9 g/dL (ref 13.0–17.0)
MCHC: 33.8 g/dL (ref 30.0–36.0)
MCV: 91.6 fl (ref 78.0–100.0)
Platelets: 244 10*3/uL (ref 150.0–400.0)
RBC: 4.48 Mil/uL (ref 4.22–5.81)
RDW: 13 % (ref 11.5–15.5)
WBC: 4.5 10*3/uL (ref 4.0–10.5)

## 2015-10-16 LAB — COMPREHENSIVE METABOLIC PANEL
ALBUMIN: 4 g/dL (ref 3.5–5.2)
ALK PHOS: 62 U/L (ref 39–117)
ALT: 16 U/L (ref 0–53)
AST: 16 U/L (ref 0–37)
BILIRUBIN TOTAL: 0.6 mg/dL (ref 0.2–1.2)
BUN: 21 mg/dL (ref 6–23)
CO2: 30 mEq/L (ref 19–32)
CREATININE: 1.31 mg/dL (ref 0.40–1.50)
Calcium: 9.1 mg/dL (ref 8.4–10.5)
Chloride: 104 mEq/L (ref 96–112)
GFR: 57.15 mL/min — ABNORMAL LOW (ref >60.00)
GLUCOSE: 110 mg/dL — AB (ref 70–99)
Potassium: 4.2 mEq/L (ref 3.5–5.1)
SODIUM: 139 meq/L (ref 135–145)
TOTAL PROTEIN: 6.9 g/dL (ref 6.0–8.3)

## 2015-10-16 LAB — LIPID PANEL
Cholesterol: 157 mg/dL (ref 0–200)
HDL: 40.9 mg/dL (ref >39.00)
LDL Cholesterol: 83 mg/dL (ref 0–99)
NonHDL: 116.33
TRIGLYCERIDES: 169 mg/dL — AB (ref 0.0–149.0)
Total CHOL/HDL Ratio: 4
VLDL: 33.8 mg/dL (ref 0.0–40.0)

## 2015-10-16 LAB — LDL CHOLESTEROL, DIRECT: LDL DIRECT: 86 mg/dL

## 2015-10-20 ENCOUNTER — Other Ambulatory Visit: Payer: PRIVATE HEALTH INSURANCE

## 2015-10-20 ENCOUNTER — Encounter: Payer: Self-pay | Admitting: Family Medicine

## 2015-10-27 ENCOUNTER — Encounter: Payer: Self-pay | Admitting: Family Medicine

## 2015-10-27 ENCOUNTER — Ambulatory Visit (INDEPENDENT_AMBULATORY_CARE_PROVIDER_SITE_OTHER): Payer: PRIVATE HEALTH INSURANCE | Admitting: Family Medicine

## 2015-10-27 VITALS — BP 138/86 | HR 73 | Temp 98.5°F | Ht 72.0 in | Wt 194.0 lb

## 2015-10-27 DIAGNOSIS — G4700 Insomnia, unspecified: Secondary | ICD-10-CM | POA: Diagnosis not present

## 2015-10-27 DIAGNOSIS — K219 Gastro-esophageal reflux disease without esophagitis: Secondary | ICD-10-CM

## 2015-10-27 DIAGNOSIS — Z0000 Encounter for general adult medical examination without abnormal findings: Secondary | ICD-10-CM

## 2015-10-27 DIAGNOSIS — E785 Hyperlipidemia, unspecified: Secondary | ICD-10-CM

## 2015-10-27 DIAGNOSIS — C61 Malignant neoplasm of prostate: Secondary | ICD-10-CM | POA: Diagnosis not present

## 2015-10-27 MED ORDER — PANTOPRAZOLE SODIUM 20 MG PO TBEC: 20.0000 mg | DELAYED_RELEASE_TABLET | Freq: Every morning | ORAL | Status: DC

## 2015-10-27 NOTE — Progress Notes (Signed)
Richard Reddish, MD Phone: [phone]  Subjective:  Patient presents today for their annual physical. Chief complaint-noted.   See problem oriented charting- ROS- full  review of systems was completed and negative including No chest pain or shortness of breath. No headache or blurry vision.   The following were reviewed and entered/updated in epic: Past Medical History  Diagnosis Date  . Nephrolithiasis   . Coronary artery disease     a. ETT 9/13: + ischemic ECG changes;  b. ETT-MV 9/13: EF 62%;  + ischemic EKG changes and + inf ischemia;  c. LHC 9/13: pLAD 30%, mLAD 70-80%, oDx 40-50% ==> FFR hemodynamically significant stenosis in mLAD ==> PCI: Xience Xpedition DES to mLAD; Cath 06/01/12   single vessel CAD w/ patent mid LAD stent and normal LV systolic function  . GERD (gastroesophageal reflux disease)   . Hyperlipidemia LDL goal < 70   . Fibromyalgia     muscle aches in past in both forearms, before statin even  . Testosterone deficiency 04/28/2008    History treatment around 2009. No current treatment.      Patient Active Problem List   Diagnosis Date Noted  . Prostate cancer ([place]) 08/18/2014    Priority: High  . Coronary atherosclerosis of native coronary artery 04/12/2012    Priority: High  . GERD (gastroesophageal reflux disease) 03/17/2015    Priority: Medium  . Bilateral occipital neuralgia 01/22/2014    Priority: Medium  . Hyperlipidemia 04/25/2012    Priority: Medium  . Insomnia 04/22/2010    Priority: Low  . NEPHROLITHIASIS, HX OF 03/26/2008    Priority: Low   Past Surgical History  Procedure Laterality Date  . Inguinal hernia repair  1990's    right  . Knee arthroscopy  1990's    right  . Cataract extraction w/ intraocular lens implant  08/2011    right .Left 12/2014.   . Cardiac catheterization  04/06/2012; 06/01/2012  . Coronary angioplasty with stent placement  04/11/2012    DES LAD  . Cystoscopy w/ stone manipulation  2009  . Percutaneous coronary  stent intervention (pci-s) N/A 04/11/2012    Procedure: PERCUTANEOUS CORONARY STENT INTERVENTION (PCI-S);  Surgeon: Burnell Blanks, MD;  Location: [facility] CATH LAB;  Service: Cardiovascular;  Laterality: N/A;  . Left heart catheterization with coronary angiogram N/A 06/01/2012    Procedure: LEFT HEART CATHETERIZATION WITH CORONARY ANGIOGRAM;  Surgeon: Burnell Blanks, MD;  Location: [facility] CATH LAB;  Service: Cardiovascular;  Laterality: N/A;  . Robot assisted laparoscopic radical prostatectomy N/A 08/18/2014    Procedure: ROBOTIC ASSISTED LAPAROSCOPIC RADICAL PROSTATECTOMY LEVEL 2;  Surgeon: Raynelle Bring, MD;  Location: [facility] ORS;  Service: Urology;  Laterality: N/A;  . Lymphadenectomy Bilateral 08/18/2014    Procedure: LYMPHADENECTOMY;  Surgeon: Raynelle Bring, MD;  Location: [facility] ORS;  Service: Urology;  Laterality: Bilateral;    Family History  Problem Relation Age of Onset  . Diabetes Mother   . Heart attack Mother 36  . Diabetes Father   . Heart attack Father 74  . Diabetes Sister   . Arthritis Brother     Medications- reviewed and updated Current Outpatient Prescriptions  Medication Sig Dispense Refill  . ALPRAZolam (XANAX) 0.5 MG tablet TAKE ONE TABLET (0.5 MG) BY MOUTH TWO TIMES DAILY AS NEEDED FOR ANXIETY (Patient not taking: Reported on 09/22/2015) 30 tablet 0  . aspirin EC 81 MG tablet Take 81 mg by mouth at bedtime.    Marland Kitchen atorvastatin (LIPITOR) 10 MG tablet Take 1 tablet (10  mg total) by mouth daily. 90 tablet 2  . clopidogrel (PLAVIX) 75 MG tablet Take 1 tablet (75 mg total) by mouth daily. 90 tablet 2  . nitroGLYCERIN (NITROSTAT) 0.4 MG SL tablet Place 1 tablet (0.4 mg total) under the tongue every 5 (five) minutes as needed for chest pain. (Patient not taking: Reported on 09/22/2015) 25 tablet 6  . pantoprazole (PROTONIX) 40 MG tablet TAKE ONE TABLET BY MOUTH EVERY MORNING 90 tablet 3  . polyvinyl alcohol (LIQUIFILM TEARS) 1.4 % ophthalmic solution Place 1 drop into both eyes  daily as needed for dry eyes.    . sodium chloride (OCEAN) 0.65 % SOLN nasal spray Place 1 spray into both nostrils daily as needed for congestion.     No current facility-administered medications for this visit.    Allergies-reviewed and updated Allergies  Allergen Reactions  . Sulfa Antibiotics Rash    rash    Social History   Social History  . Marital Status: Married    Spouse Name: N/A  . Number of Children: 2  . Years of Education: N/A   Occupational History  . Retired    Social History Main Topics  . Smoking status: Never Smoker   . Smokeless tobacco: Never Used  . Alcohol Use: No  . Drug Use: No  . Sexual Activity: Not Currently   Other Topics Concern  . Not on file   Social History Narrative   Married (brother patient here, wife patient elsewhere). 2 children (daughter in [place], son in [place] with [employer]).       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 Oropharynx normal Neck: no thyromegaly CV: RRR no murmurs rubs or gallops Lungs: CTAB no crackles, wheeze, rhonchi Abdomen: soft/nontender/nondistended/normal bowel sounds. No rebound or guarding.  Ext: no edema Skin: warm, dry Neuro: grossly normal, moves all extremities, PERRLA  Assessment/Plan:  70 y.o. male presenting for annual physical.  Health Maintenance counseling: 1. Anticipatory guidance: Patient counseled regarding regular dental exams, eye exams, wearing seatbelts.  2. Risk factor reduction:  Advised patient of need for regular exercise and diet rich and fruits and vegetables to reduce risk of heart attack and stroke. Walks 2 miles a day and works in yard. Mild  weight loss. Would really like to be 89 but struggles to get there. 3. Immunizations/screenings/ancillary studies Immunization History  Administered Date(s) Administered  . Influenza Split 05/30/2011, 05/15/2012  . Influenza Whole 03/26/2008, 04/22/2010  . Influenza, High Dose Seasonal PF 04/26/2013, 04/15/2014  . Influenza,inj,Quad PF,36+ Mos 03/17/2015  . Pneumococcal Conjugate-13 04/15/2014  . Pneumococcal Polysaccharide-23 04/22/2010  . Td 07/12/1995, 03/26/2008  . Zoster 03/26/2008   4. Prostate cancer - history of prostate cancer treated with Radical prostatectomy 08/2014. Currently under Dr. Lynne Logan care.  Last of march had PSA and was <0.01.  Lab Results  Component Value Date   PSA 0.01 10/22/2014   PSA 5.74* 04/15/2014   PSA 3.76 12/06/2012   5. Colon cancer screening - 08/12/2007 with 10 year repeat  CAD- compliant with aspirin, plavix, atorvastatin. Has nitro on hand but does not use.   Keep an eye on GFR- under 60  slightly for first time. Reduce PPI dose  Hyperglycemia- chewed sugar gum on morning of visit-will monitor  Bilateral occipital neuralgia treated with prednisone recently- did improve but did not resolve. Consideration was to refer to Dr. Tomi Likens or [facility]. May be cervical in origin. Opts out at this time but is interested in rheumatology despite ESR and CRP not being elevated- he will reach out if changes his mind  Prostate cancer  history of prostate cancer treated with Radical prostatectomy 08/2014. Currently under Dr. Lynne Logan care.  Last of march had PSA and was <0.01.   Hyperlipidemia LDL above goal of 70 with CAD history, we opted not to increase at this time as may further increase diabetes risk and sugar was 110 (did chew gum with sugar that AM though). Cards did not push for LDL goal <70 last visit  Insomnia melatonin mostly works, uses xanax sparingly in past- off completely at this point  GERD (gastroesophageal reflux disease) on  protonix 75m, trial lower dose at 237m GFR just under 60- may try to change to zantac next visit if tolerates this change.     Return in about 6 months (around 04/27/2016) for annual wellness visit. He may opt out and return for CPE in 1 year.  Return precautions advised.   Meds ordered this encounter  Medications  . pantoprazole (PROTONIX) 20 MG tablet    Sig: Take 1 tablet (20 mg total) by mouth every morning.    Dispense:  90 tablet    Refill:  3

## 2015-10-27 NOTE — Assessment & Plan Note (Signed)
on protonix 40mg , trial lower dose at 20mg . GFR just under 60- may try to change to zantac next visit if tolerates this change.

## 2015-10-27 NOTE — Assessment & Plan Note (Signed)
history of prostate cancer treated with Radical prostatectomy 08/2014. Currently under Dr. Lynne Logan care.  Last of march had PSA and was <0.01.

## 2015-10-27 NOTE — Patient Instructions (Signed)
Trial 20mg  protonix to see if still controls symptoms May lower risk to kidneys If doesn't work we can increase back up  Sugar was slightly high but may have been the gum  Kidney function slightly worse than in the past. Avoid aleve, ibuprofen, motrin as much as possible- tylenol is ok. We will recheck next visit.

## 2015-10-27 NOTE — Assessment & Plan Note (Signed)
treated with prednisone recently- did improve but did not resolve. Consideration was to refer to Dr. Tomi Likens or [facility]. May be cervical in origin. Opts out at this time but is interested in rheumatology despite ESR and CRP not being elevated- he will reach out if changes his mind

## 2015-10-27 NOTE — Assessment & Plan Note (Signed)
melatonin mostly works, uses xanax sparingly in past- off completely at this point

## 2015-10-27 NOTE — Assessment & Plan Note (Signed)
LDL above goal of 70 with CAD history, we opted not to increase at this time as may further increase diabetes risk and sugar was 110 (did chew gum with sugar that AM though). Cards did not push for LDL goal <70 last visit

## 2015-11-02 ENCOUNTER — Encounter: Payer: Self-pay | Admitting: Cardiovascular Disease

## 2015-11-04 ENCOUNTER — Encounter: Payer: Self-pay | Admitting: Family Medicine

## 2015-11-04 ENCOUNTER — Other Ambulatory Visit: Payer: Self-pay | Admitting: Family Medicine

## 2015-11-04 DIAGNOSIS — R519 Headache, unspecified: Secondary | ICD-10-CM

## 2015-11-04 DIAGNOSIS — R51 Headache: Principal | ICD-10-CM

## 2015-11-04 NOTE — Telephone Encounter (Signed)
May refer to rheumatology under headaches. I think this is coming from his neck but he prefers rheum

## 2015-11-13 ENCOUNTER — Other Ambulatory Visit: Payer: Self-pay | Admitting: Family Medicine

## 2015-11-13 ENCOUNTER — Encounter: Payer: Self-pay | Admitting: Family Medicine

## 2015-11-13 DIAGNOSIS — K5900 Constipation, unspecified: Secondary | ICD-10-CM

## 2015-11-13 NOTE — Telephone Encounter (Signed)
May refer to GI.   Tell him every other night he can take 2 scoops of miralax for 1 week then return to once daily

## 2015-11-19 DIAGNOSIS — M542 Cervicalgia: Secondary | ICD-10-CM | POA: Diagnosis not present

## 2015-11-19 DIAGNOSIS — M353 Polymyalgia rheumatica: Secondary | ICD-10-CM | POA: Diagnosis not present

## 2015-11-19 DIAGNOSIS — R51 Headache: Secondary | ICD-10-CM | POA: Diagnosis not present

## 2015-11-20 ENCOUNTER — Encounter: Payer: Self-pay | Admitting: Family Medicine

## 2015-11-20 DIAGNOSIS — M353 Polymyalgia rheumatica: Secondary | ICD-10-CM | POA: Insufficient documentation

## 2015-12-10 ENCOUNTER — Encounter: Payer: Self-pay | Admitting: Family Medicine

## 2015-12-23 DIAGNOSIS — M353 Polymyalgia rheumatica: Secondary | ICD-10-CM | POA: Diagnosis not present

## 2016-01-13 ENCOUNTER — Encounter: Payer: Self-pay | Admitting: Cardiovascular Disease

## 2016-01-14 ENCOUNTER — Telehealth: Payer: Self-pay | Admitting: Cardiovascular Disease

## 2016-01-14 NOTE — Telephone Encounter (Signed)
**Note De-Identified Krystelle Prashad Obfuscation** The pt is advised per Nell Range, PA-c (Flex) that it is ok for him to start Stera pak of Prednisone for his abscessed tooth. He verbalized understanding and expressed gratitude for my assistance.

## 2016-01-14 NOTE — Telephone Encounter (Signed)
**Note De-Identified Richard Mahoney Obfuscation** The pt is advised per Nell Range, PA-c (Flex) that it is ok for him to start Stera pak of Prednisone for his abscessed tooth. He verbalized understanding and expressed gratitude for my assistance.

## 2016-01-14 NOTE — Telephone Encounter (Signed)
New Message  Pt requests call about med approval.  Pt sent my chart message concerning medication. Please advise. IB

## 2016-01-18 ENCOUNTER — Ambulatory Visit: Payer: PRIVATE HEALTH INSURANCE | Admitting: Gastroenterology

## 2016-03-24 DIAGNOSIS — R739 Hyperglycemia, unspecified: Secondary | ICD-10-CM | POA: Diagnosis not present

## 2016-03-24 DIAGNOSIS — Z7952 Long term (current) use of systemic steroids: Secondary | ICD-10-CM | POA: Diagnosis not present

## 2016-03-24 DIAGNOSIS — E559 Vitamin D deficiency, unspecified: Secondary | ICD-10-CM | POA: Diagnosis not present

## 2016-03-24 DIAGNOSIS — M353 Polymyalgia rheumatica: Secondary | ICD-10-CM | POA: Diagnosis not present

## 2016-04-14 DIAGNOSIS — Z8546 Personal history of malignant neoplasm of prostate: Secondary | ICD-10-CM | POA: Diagnosis not present

## 2016-04-14 DIAGNOSIS — N5201 Erectile dysfunction due to arterial insufficiency: Secondary | ICD-10-CM | POA: Diagnosis not present

## 2016-05-30 ENCOUNTER — Encounter: Payer: Self-pay | Admitting: Cardiovascular Disease

## 2016-06-15 ENCOUNTER — Encounter: Payer: Self-pay | Admitting: Family Medicine

## 2016-06-17 ENCOUNTER — Other Ambulatory Visit: Payer: Self-pay | Admitting: Cardiovascular Disease

## 2016-06-17 MED ORDER — ATORVASTATIN CALCIUM 10 MG PO TABS: 10.0000 mg | ORAL_TABLET | Freq: Every day | ORAL | 0 refills | Status: DC

## 2016-07-26 DIAGNOSIS — R609 Edema, unspecified: Secondary | ICD-10-CM | POA: Diagnosis not present

## 2016-08-08 ENCOUNTER — Other Ambulatory Visit: Payer: Self-pay | Admitting: *Deleted

## 2016-08-08 MED ORDER — ATORVASTATIN CALCIUM 10 MG PO TABS: 10.0000 mg | ORAL_TABLET | Freq: Every day | ORAL | 0 refills | Status: DC

## 2016-08-08 MED ORDER — CLOPIDOGREL BISULFATE 75 MG PO TABS: 75.0000 mg | ORAL_TABLET | Freq: Every day | ORAL | 0 refills | Status: DC

## 2016-08-16 ENCOUNTER — Encounter: Payer: Self-pay | Admitting: Cardiovascular Disease

## 2016-09-08 ENCOUNTER — Other Ambulatory Visit: Payer: Self-pay | Admitting: Cardiovascular Disease

## 2016-09-09 ENCOUNTER — Encounter: Payer: Self-pay | Admitting: Cardiovascular Disease

## 2016-09-09 ENCOUNTER — Ambulatory Visit (INDEPENDENT_AMBULATORY_CARE_PROVIDER_SITE_OTHER): Payer: Commercial Managed Care - PPO | Admitting: Cardiovascular Disease

## 2016-09-09 VITALS — BP 134/86 | HR 63 | Ht 72.0 in | Wt 199.2 lb

## 2016-09-09 DIAGNOSIS — I2510 Atherosclerotic heart disease of native coronary artery without angina pectoris: Secondary | ICD-10-CM

## 2016-09-09 DIAGNOSIS — E7800 Pure hypercholesterolemia, unspecified: Secondary | ICD-10-CM | POA: Diagnosis not present

## 2016-09-09 NOTE — Patient Instructions (Signed)

## 2016-09-09 NOTE — Progress Notes (Signed)
Chief Complaint  Patient presents with  . Follow-up    annual    History of Present Illness: 71 yo male with history of CAD, kidney stones, GERD here today for cardiac follow up. Initially seen in 2013 after abnormal stress test. Stress myoview 04/02/12 with ST depression at peak exercise and inferior wall defect concerning for ischemia. Cardiac cath on 04/06/12 with moderately severe mid LAD lesion involving the diagonal branch. He was brought back for PCI on 04/11/12 and FFR of the LAD was 0.77. A Xience Expedition (2.75 mm x 23 mm) drug eluting stent was placed in the mid LAD and angioplasty was performed on the ostium of the diagonal. Recurrent chest pain November 2013 with cardiac cath 06/01/12 demonstrating stable disease with patency of LAD stent. He has tolerated Lipitor 20 mg every other day. Stress test in February 2017 in [place] and he had no ischemia.   He is here today for cardiac follow up. He has been doing well. No chest pain or SOB. He has been active. He has been exercising every day and playing golf.    Primary Care Physician: Garret Reddish, MD  Past Medical History:  Diagnosis Date  . Coronary artery disease    a. ETT 9/13: + ischemic ECG changes;  b. ETT-MV 9/13: EF 62%;  + ischemic EKG changes and + inf ischemia;  c. LHC 9/13: pLAD 30%, mLAD 70-80%, oDx 40-50% ==> FFR hemodynamically significant stenosis in mLAD ==> PCI: Xience Xpedition DES to mLAD; Cath 06/01/12   single vessel CAD w/ patent mid LAD stent and normal LV systolic function  . Fibromyalgia    muscle aches in past in both forearms, before statin even  . GERD (gastroesophageal reflux disease)   . Hyperlipidemia LDL goal < 70   . Nephrolithiasis   . Testosterone deficiency 04/28/2008   History treatment around 2009. No current treatment.       Past Surgical History:  Procedure Laterality Date  . CARDIAC CATHETERIZATION  04/06/2012; 06/01/2012  . CATARACT EXTRACTION W/ INTRAOCULAR LENS IMPLANT   08/2011   right .Left 12/2014.   Marland Kitchen CORONARY ANGIOPLASTY WITH STENT PLACEMENT  04/11/2012   DES LAD  . CYSTOSCOPY W/ STONE MANIPULATION  2009  . INGUINAL HERNIA REPAIR  1990's   right  . KNEE ARTHROSCOPY  1990's   right  . LEFT HEART CATHETERIZATION WITH CORONARY ANGIOGRAM N/A 06/01/2012   Procedure: LEFT HEART CATHETERIZATION WITH CORONARY ANGIOGRAM;  Surgeon: Burnell Blanks, MD;  Location: [facility] CATH LAB;  Service: Cardiovascular;  Laterality: N/A;  . LYMPHADENECTOMY Bilateral 08/18/2014   Procedure: LYMPHADENECTOMY;  Surgeon: Raynelle Bring, MD;  Location: [facility];  Service: Urology;  Laterality: Bilateral;  . PERCUTANEOUS CORONARY STENT INTERVENTION (PCI-S) N/A 04/11/2012   Procedure: PERCUTANEOUS CORONARY STENT INTERVENTION (PCI-S);  Surgeon: Burnell Blanks, MD;  Location: [facility] CATH LAB;  Service: Cardiovascular;  Laterality: N/A;  . ROBOT ASSISTED LAPAROSCOPIC RADICAL PROSTATECTOMY N/A 08/18/2014   Procedure: ROBOTIC ASSISTED LAPAROSCOPIC RADICAL PROSTATECTOMY LEVEL 2;  Surgeon: Raynelle Bring, MD;  Location: [facility] ORS;  Service: Urology;  Laterality: N/A;    Current Outpatient Prescriptions  Medication Sig Dispense Refill  . aspirin EC 81 MG tablet Take 81 mg by mouth at bedtime.    Marland Kitchen atorvastatin (LIPITOR) 10 MG tablet Take 1 tablet (10 mg total) by mouth daily. 60 tablet 0  . clopidogrel (PLAVIX) 75 MG tablet Take 1 tablet (75 mg total) by mouth daily. 60 tablet 0  . Melatonin 3 MG  TABS Take 3 mg by mouth at bedtime.    . methotrexate (RHEUMATREX) 2.5 MG tablet Take 10 mg by mouth once a week. Caution:Chemotherapy. Protect from light.    . nitroGLYCERIN (NITROSTAT) 0.4 MG SL tablet Place 1 tablet (0.4 mg total) under the tongue every 5 (five) minutes as needed for chest pain. 25 tablet 6  . pantoprazole (PROTONIX) 20 MG tablet Take 1 tablet (20 mg total) by mouth every morning. 90 tablet 3  . polyvinyl alcohol (LIQUIFILM TEARS) 1.4 % ophthalmic solution Place 1 drop into both eyes  daily as needed for dry eyes.    . sodium chloride (OCEAN) 0.65 % SOLN nasal spray Place 1 spray into both nostrils daily as needed for congestion.     No current facility-administered medications for this visit.     Allergies  Allergen Reactions  . Sulfa Antibiotics Rash    rash    Social History   Social History  . Marital status: Married    Spouse name: N/A  . Number of children: 2  . Years of education: N/A   Occupational History  . Retired    Social History Main Topics  . Smoking status: Never Smoker  . Smokeless tobacco: Never Used  . Alcohol use No  . Drug use: No  . Sexual activity: Not Currently   Other Topics Concern  . Not on file   Social History Narrative   Married (brother patient here, wife patient elsewhere). 2 children (daughter in [place], son in [place] with [employer]).       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 Diabetes Mother   . Heart attack Mother 26  . Diabetes Father   . Heart attack Father 17  . Diabetes Sister   . Arthritis Brother     Review of Systems:  As stated in the HPI and otherwise negative.   BP 134/86 (BP Location: Left Arm)   Pulse 63   Ht 6' (1.829 m)   Wt 199 lb 3.2 oz (90.4 kg)   BMI 27.02 kg/m   Physical Examination: General: Well developed, well nourished, NAD  HEENT: OP clear, mucus membranes moist  SKIN: warm, dry. No rashes. Neuro: No focal deficits  Musculoskeletal: Muscle strength 5/5 all ext  Psychiatric: Mood and affect normal  Neck: No JVD, no carotid bruits, no thyromegaly, no lymphadenopathy.  Lungs:Clear bilaterally, no wheezes, rhonci, crackles Cardiovascular: Regular rate and rhythm. No murmurs, gallops or rubs. Abdomen:Soft. Bowel sounds present. Non-tender.  Extremities: No lower extremity edema. Pulses are 2 + in the  bilateral DP/PT.  Cardiac cath 06/01/12:  Left main: No obstructive disease noted.  Left Anterior Descending Artery: Moderate caliber vessel that courses to the apex. The proximal vessel has a 30% stenosis. The mid vessel has a patent stented segment with no evidence of restenosis. The distal LAD has no obstructive disease. The diagonal branch is covered by the LAD stent and has a 10% ostial stenosis. There is excellent flow down the diagonal branch.  Intermediate Artery: Small caliber vessel with 30% stenosis.  Circumflex Artery: Large caliber, co-dominant vessel with no obstructive disease noted. There are three small caliber obtuse marginal branches that have no obstructive disease noted.  Right Coronary Artery: Small caliber, co-dominant vessel with mild plaque in the proximal and mid segments.  Left Ventricular  Angiogram: LVEF=60-65%   EKG:  EKG is ordered today. The ekg ordered today demonstrates NSR, rate 62 bpm.   Recent Labs: 10/16/2015: ALT 16; BUN 21; Creatinine, Ser 1.31; Hemoglobin 13.9; Platelets 244.0; Potassium 4.2; Sodium 139   Lipid Panel    Component Value Date/Time   CHOL 157 10/16/2015 0841   TRIG 169.0 (H) 10/16/2015 0841   HDL 40.90 10/16/2015 0841   CHOLHDL 4 10/16/2015 0841   VLDL 33.8 10/16/2015 0841   LDLCALC 83 10/16/2015 0841   LDLDIRECT 86.0 10/16/2015 0841     Wt Readings from Last 3 Encounters:  09/09/16 199 lb 3.2 oz (90.4 kg)  10/27/15 194 lb (88 kg)  09/22/15 196 lb (88.9 kg)     Other studies Reviewed: Additional studies/ records that were reviewed today include: . Review of the above records demonstrates:    Assessment and Plan:   1. CAD without angina: He is doing well. No recent chest pain. Continue ASA and Plavix. No beta blocker with bradycardia. Xanax prn anxiety.   2.GERD: Continue PPI  3. HLD: Continue statin. Lipids well controlled. Followed in primary care.   Current medicines are reviewed at length with the patient today.   The patient does not have concerns regarding medicines.  The following changes have been made:  no change  Labs/ tests ordered today include:   Orders Placed This Encounter  Procedures  . EKG 12-Lead     Disposition:   FU with me in 12  months   Signed, Lauree Chandler, MD 09/09/2016 3:50 PM    [facility] [place], [place], [place]  [address] Phone: [phone]; Fax: [phone]

## 2016-10-04 ENCOUNTER — Ambulatory Visit (INDEPENDENT_AMBULATORY_CARE_PROVIDER_SITE_OTHER): Payer: Commercial Managed Care - PPO

## 2016-10-04 DIAGNOSIS — Z23 Encounter for immunization: Secondary | ICD-10-CM | POA: Diagnosis not present

## 2016-10-04 NOTE — Progress Notes (Signed)
Patient got a TDap injection for a newborn baby in the family, Patient was made aware that his insurance might not cover the vaccination, patient stated that was not a problem. Patient signed the CDC form confirming that he agrees to cover the cost if insurance denies the coverage. Given by Rosealee Albee CMA

## 2016-10-16 ENCOUNTER — Other Ambulatory Visit: Payer: Self-pay | Admitting: Cardiovascular Disease

## 2016-10-18 LAB — PSA: PSA: 1.61

## 2016-10-25 ENCOUNTER — Telehealth: Payer: Self-pay | Admitting: Family Medicine

## 2016-10-25 DIAGNOSIS — Z8546 Personal history of malignant neoplasm of prostate: Secondary | ICD-10-CM | POA: Diagnosis not present

## 2016-10-25 DIAGNOSIS — N5201 Erectile dysfunction due to arterial insufficiency: Secondary | ICD-10-CM | POA: Diagnosis not present

## 2016-10-25 LAB — PSA: PSA: UNDETECTABLE

## 2016-10-27 ENCOUNTER — Other Ambulatory Visit: Payer: Self-pay | Admitting: *Deleted

## 2016-10-27 DIAGNOSIS — Z0000 Encounter for general adult medical examination without abnormal findings: Secondary | ICD-10-CM

## 2016-10-27 DIAGNOSIS — E785 Hyperlipidemia, unspecified: Secondary | ICD-10-CM

## 2016-10-28 ENCOUNTER — Ambulatory Visit: Payer: Commercial Managed Care - PPO

## 2016-10-28 ENCOUNTER — Other Ambulatory Visit (INDEPENDENT_AMBULATORY_CARE_PROVIDER_SITE_OTHER): Payer: Commercial Managed Care - PPO

## 2016-10-28 DIAGNOSIS — Z0000 Encounter for general adult medical examination without abnormal findings: Secondary | ICD-10-CM

## 2016-10-28 DIAGNOSIS — E785 Hyperlipidemia, unspecified: Secondary | ICD-10-CM

## 2016-10-28 LAB — CBC WITH DIFFERENTIAL/PLATELET
BASOS PCT: 1.1 % (ref 0.0–3.0)
Basophils Absolute: 0 10*3/uL (ref 0.0–0.1)
EOS PCT: 2.8 % (ref 0.0–5.0)
Eosinophils Absolute: 0.1 10*3/uL (ref 0.0–0.7)
HCT: 41.6 % (ref 39.0–52.0)
Hemoglobin: 14.3 g/dL (ref 13.0–17.0)
LYMPHS ABS: 1.5 10*3/uL (ref 0.7–4.0)
Lymphocytes Relative: 37.1 % (ref 12.0–46.0)
MCHC: 34.4 g/dL (ref 30.0–36.0)
MCV: 92.8 fl (ref 78.0–100.0)
MONO ABS: 0.4 10*3/uL (ref 0.1–1.0)
MONOS PCT: 9.5 % (ref 3.0–12.0)
NEUTROS ABS: 2 10*3/uL (ref 1.4–7.7)
NEUTROS PCT: 49.5 % (ref 43.0–77.0)
Platelets: 252 10*3/uL (ref 150.0–400.0)
RBC: 4.48 Mil/uL (ref 4.22–5.81)
RDW: 13.3 % (ref 11.5–15.5)
WBC: 4.1 10*3/uL (ref 4.0–10.5)

## 2016-10-28 LAB — COMPREHENSIVE METABOLIC PANEL
ALK PHOS: 63 U/L (ref 39–117)
ALT: 21 U/L (ref 0–53)
AST: 17 U/L (ref 0–37)
Albumin: 4.3 g/dL (ref 3.5–5.2)
BUN: 20 mg/dL (ref 6–23)
CHLORIDE: 103 meq/L (ref 96–112)
CO2: 29 mEq/L (ref 19–32)
Calcium: 9.2 mg/dL (ref 8.4–10.5)
Creatinine, Ser: 1.14 mg/dL (ref 0.40–1.50)
GFR: 66.9 mL/min (ref >60.00)
GLUCOSE: 97 mg/dL (ref 70–99)
POTASSIUM: 4.1 meq/L (ref 3.5–5.1)
SODIUM: 140 meq/L (ref 135–145)
TOTAL PROTEIN: 6.6 g/dL (ref 6.0–8.3)
Total Bilirubin: 1 mg/dL (ref 0.2–1.2)

## 2016-10-28 LAB — LIPID PANEL
CHOLESTEROL: 145 mg/dL (ref 0–200)
HDL: 42.2 mg/dL (ref >39.00)
LDL Cholesterol: 75 mg/dL (ref 0–99)
NONHDL: 102.41
Total CHOL/HDL Ratio: 3
Triglycerides: 135 mg/dL (ref 0.0–149.0)
VLDL: 27 mg/dL (ref 0.0–40.0)

## 2016-11-01 ENCOUNTER — Encounter: Payer: Self-pay | Admitting: Family Medicine
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
## 2016-11-04 ENCOUNTER — Ambulatory Visit (INDEPENDENT_AMBULATORY_CARE_PROVIDER_SITE_OTHER): Payer: Commercial Managed Care - PPO | Admitting: Family Medicine

## 2016-11-04 ENCOUNTER — Ambulatory Visit: Payer: Commercial Managed Care - PPO

## 2016-11-04 ENCOUNTER — Encounter: Payer: Self-pay | Admitting: Family Medicine

## 2016-11-04 VITALS — BP 120/82 | HR 62 | Temp 98.1°F | Ht 72.0 in | Wt 198.4 lb

## 2016-11-04 DIAGNOSIS — K219 Gastro-esophageal reflux disease without esophagitis: Secondary | ICD-10-CM | POA: Diagnosis not present

## 2016-11-04 DIAGNOSIS — E7800 Pure hypercholesterolemia, unspecified: Secondary | ICD-10-CM | POA: Diagnosis not present

## 2016-11-04 DIAGNOSIS — M353 Polymyalgia rheumatica: Secondary | ICD-10-CM

## 2016-11-04 DIAGNOSIS — Z0000 Encounter for general adult medical examination without abnormal findings: Secondary | ICD-10-CM | POA: Diagnosis not present

## 2016-11-04 MED ORDER — NITROGLYCERIN 0.4 MG SL SUBL: 0.4000 mg | SUBLINGUAL_TABLET | SUBLINGUAL | 2 refills | Status: DC | PRN

## 2016-11-04 MED ORDER — ATORVASTATIN CALCIUM 20 MG PO TABS: 20.0000 mg | ORAL_TABLET | Freq: Every day | ORAL | 3 refills | Status: DC

## 2016-11-04 NOTE — Assessment & Plan Note (Signed)
GERD- on protonix and well controlled. Down from 74 to 5 without adverse effects

## 2016-11-04 NOTE — Progress Notes (Signed)
Phone: [phone]  Subjective:  Patient presents today for their annual physical. Chief complaint-noted.   See problem oriented charting- ROS- full  review of systems was completed and negative including No chest pain or shortness of breath. No headache or blurry vision.   The following were reviewed and entered/updated in epic: Past Medical History:  Diagnosis Date  . Coronary artery disease    a. ETT 9/13: + ischemic ECG changes;  b. ETT-MV 9/13: EF 62%;  + ischemic EKG changes and + inf ischemia;  c. LHC 9/13: pLAD 30%, mLAD 70-80%, oDx 40-50% ==> FFR hemodynamically significant stenosis in mLAD ==> PCI: Xience Xpedition DES to mLAD; Cath 06/01/12   single vessel CAD w/ patent mid LAD stent and normal LV systolic function  . Fibromyalgia    muscle aches in past in both forearms, before statin even  . GERD (gastroesophageal reflux disease)   . Hyperlipidemia LDL goal < 70   . Nephrolithiasis   . Testosterone deficiency 04/28/2008   History treatment around 2009. No current treatment.      Patient Active Problem List   Diagnosis Date Noted  . PMR (polymyalgia rheumatica) (HCC) 11/20/2015    Priority: High  . History of prostate cancer 08/18/2014    Priority: High  . Coronary atherosclerosis of native coronary artery 04/12/2012    Priority: High  . GERD (gastroesophageal reflux disease) 03/17/2015    Priority: Medium  . Bilateral occipital neuralgia 01/22/2014    Priority: Medium  . Hyperlipidemia 04/25/2012    Priority: Medium  . Insomnia 04/22/2010    Priority: Low  . NEPHROLITHIASIS, HX OF 03/26/2008    Priority: Low   Past Surgical History:  Procedure Laterality Date  . CARDIAC CATHETERIZATION  04/06/2012; 06/01/2012  . CATARACT EXTRACTION W/ INTRAOCULAR LENS IMPLANT  08/2011   right .Left 12/2014.   Marland Kitchen CORONARY ANGIOPLASTY WITH STENT PLACEMENT  04/11/2012   DES LAD  . CYSTOSCOPY W/ STONE MANIPULATION  2009  . INGUINAL HERNIA REPAIR  1990's   right  . KNEE  ARTHROSCOPY  1990's   right  . LEFT HEART CATHETERIZATION WITH CORONARY ANGIOGRAM N/A 06/01/2012   Procedure: LEFT HEART CATHETERIZATION WITH CORONARY ANGIOGRAM;  Surgeon: Burnell Blanks, MD;  Location: [facility] CATH LAB;  Service: Cardiovascular;  Laterality: N/A;  . LYMPHADENECTOMY Bilateral 08/18/2014   Procedure: LYMPHADENECTOMY;  Surgeon: Raynelle Bring, MD;  Location: [facility] ORS;  Service: Urology;  Laterality: Bilateral;  . PERCUTANEOUS CORONARY STENT INTERVENTION (PCI-S) N/A 04/11/2012   Procedure: PERCUTANEOUS CORONARY STENT INTERVENTION (PCI-S);  Surgeon: Burnell Blanks, MD;  Location: [facility] CATH LAB;  Service: Cardiovascular;  Laterality: N/A;  . ROBOT ASSISTED LAPAROSCOPIC RADICAL PROSTATECTOMY N/A 08/18/2014   Procedure: ROBOTIC ASSISTED LAPAROSCOPIC RADICAL PROSTATECTOMY LEVEL 2;  Surgeon: Raynelle Bring, MD;  Location: [facility] ORS;  Service: Urology;  Laterality: N/A;    Family History  Problem Relation Age of Onset  . Diabetes Mother   . Heart attack Mother 39  . Diabetes Father   . Heart attack Father 56  . Diabetes Sister   . Arthritis Brother     Medications- reviewed and updated Current Outpatient Prescriptions  Medication Sig Dispense Refill  . aspirin EC 81 MG tablet Take 81 mg by mouth at bedtime.    . clopidogrel (PLAVIX) 75 MG tablet TAKE 1 TABLET BY MOUTH  DAILY 90 tablet 3  . Melatonin 3 MG TABS Take 3 mg by mouth at bedtime.    . methotrexate (RHEUMATREX) 2.5 MG tablet Take 10 mg  by mouth once a week. Caution:Chemotherapy. Protect from light.    . nitroGLYCERIN (NITROSTAT) 0.4 MG SL tablet Place 1 tablet (0.4 mg total) under the tongue every 5 (five) minutes as needed for chest pain. 25 tablet 2  . pantoprazole (PROTONIX) 20 MG tablet Take 1 tablet (20 mg total) by mouth every morning. 90 tablet 3  . polyvinyl alcohol (LIQUIFILM TEARS) 1.4 % ophthalmic solution Place 1 drop into both eyes daily as needed for dry eyes.    . sodium chloride (OCEAN) 0.65 % SOLN nasal  spray Place 1 spray into both nostrils daily as needed for congestion.    Marland Kitchen atorvastatin (LIPITOR) 20 MG tablet Take 1 tablet (20 mg total) by mouth daily. 90 tablet 3   No current facility-administered medications for this visit.     Allergies-reviewed and updated Allergies  Allergen Reactions  . Sulfa Antibiotics Rash    rash    Social History   Social History  . Marital status: Married    Spouse name: N/A  . Number of children: 2  . Years of education: N/A   Occupational History  . Retired    Social History Main Topics  . Smoking status: Never Smoker  . Smokeless tobacco: Never Used  . Alcohol use No  . Drug use: No  . Sexual activity: Not Currently   Other Topics Concern  . None   Social History Narrative   Married (brother patient here, wife patient elsewhere). 2 children (daughter in [place], son in [place] with ticketmaster).       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 Oropharynx normal Neck: no thyromegaly CV: RRR no murmurs rubs or gallops Lungs: CTAB no crackles, wheeze, rhonchi Abdomen: soft/nontender/nondistended/normal bowel sounds. No rebound or guarding.  Ext: no edema Skin: warm, dry Neuro: grossly normal, moves all extremities, PERRLA No rectal exam as prostatectomy  Assessment/Plan:  71 y.o. male presenting for annual physical.  Health Maintenance counseling: 1. Anticipatory guidance: Patient counseled regarding regular dental exams q6 months, eye exams yearly, wearing seatbelts.  2. Risk factor reduction:  Advised patient of need for regular exercise  and diet rich and fruits and vegetables to reduce risk of heart attack and stroke. Exercise- pretty reasonable- 3 days a week walking. Diet-reasonable- balanced.  Wants to lose about 5-10 lbs.  Wt Readings from Last 3 Encounters:  11/04/16 198 lb 6.4 oz (90 kg)  09/09/16 199 lb 3.2 oz (90.4 kg)  10/27/15 194 lb (88 kg)  3. Immunizations/screenings/ancillary studies- declines shingrix for now Immunization History  Administered Date(s) Administered  . Influenza Split 05/30/2011, 05/15/2012  . Influenza Whole 03/26/2008, 04/22/2010  . Influenza, High Dose Seasonal PF 04/26/2013, 04/15/2014, 04/28/2016  . Influenza,inj,Quad PF,36+ Mos 03/17/2015  . Pneumococcal Conjugate-13 04/15/2014  . Pneumococcal Polysaccharide-23 04/22/2010  . Td 07/12/1995, 03/26/2008  . Tdap 10/04/2016  . Zoster 03/26/2008  4. Prostate cancer screening- brings copy of labs from urology and PSA was undetectable. Not clear how abstracted into chart wrong on 10/18/16. We will work on Company secretary.  Lab Results  Component Value Date   PSA undetectable 10/25/2016   PSA 1.61 10/18/2016   PSA 0.01 10/22/2014   5. Colon cancer screening - 08/12/2007. At [place]. Will update with them next year 6. Skin cancer screening- [place] dermatology Dr. Jimmye Norman every few years  Status of chronic or acute concerns   CAD- follows with cardiology Dr. Angelena Form. Compliant with aspirin, plavix, atorvastatin. Refilled nitroglycerin but never uses hankfully  Insmonia- melatonin helps at 3 mg  Hyperlipidemia HLD- LDL at 75 on atorvastatin 10mg . We discussed 20mg  option - he agrees to this Lab Results  Component Value Date   CHOL 145 10/28/2016   HDL 42.20 10/28/2016   LDLCALC 75 10/28/2016   LDLDIRECT 86.0 10/16/2015   TRIG 135.0 10/28/2016   CHOLHDL 3 10/28/2016     GERD (gastroesophageal reflux disease) GERD- on protonix and well controlled. Down from 65 to 40 without adverse effects  PMR (polymyalgia rheumatica)  (HCC) PMR- diagnosed Dr. Dossie Der- methotrexate only.  prednisone started about a year ago now off   Return in about 1 year (around 11/04/2017) for physical.  Orders Placed This Encounter  Procedures  . PSA    This external order was created through the Results Console.    Meds ordered this encounter  Medications  . atorvastatin (LIPITOR) 20 MG tablet    Sig: Take 1 tablet (20 mg total) by mouth daily.    Dispense:  90 tablet    Refill:  3  . nitroGLYCERIN (NITROSTAT) 0.4 MG SL tablet    Sig: Place 1 tablet (0.4 mg total) under the tongue every 5 (five) minutes as needed for chest pain.    Dispense:  25 tablet    Refill:  2    Return precautions advised.   Garret Reddish, MD

## 2016-11-04 NOTE — Assessment & Plan Note (Signed)
HLD- LDL at 75 on atorvastatin 10mg . We discussed 20mg  option - he agrees to this Lab Results  Component Value Date   CHOL 145 10/28/2016   HDL 42.20 10/28/2016   LDLCALC 75 10/28/2016   LDLDIRECT 86.0 10/16/2015   TRIG 135.0 10/28/2016   CHOLHDL 3 10/28/2016

## 2016-11-04 NOTE — Progress Notes (Signed)
Pre visit review using our clinic review tool, if applicable. No additional management support is needed unless otherwise documented below in the visit note. 

## 2016-11-04 NOTE — Progress Notes (Signed)
Subjective:   Richard Mahoney is a 71 y.o. male who presents for Medicare Annual/Subsequent preventive examination.   The Patient was informed that the wellness visit is to identify future health risk and educate and initiate measures that can reduce risk for increased disease through the lifespan.    NO ROS; Medicare Wellness Visit  Visit with Dr. Yong Channel today  Describes health as good, fair or great? Good   Preventive Screening -Counseling & Management  PSA undetectable  Colonoscopy 08/2007 may recall 08/2017  Smoking history - never smoked  Second Hand Smoke status; No Smokers in the home ETOH -no/ rare beer socially   Medication adherence or issues? No issues with meds   RISK FACTORS Diet States wife fixes nutritious meal No fried foods No sweet tooth   Regular exercise  Wife and he walk 3 days a week 62min to 60 min Work in the yard [employer]   Cardiac Risk Factors:  Advanced aged > 55 in men; >65 in women Hyperlipidemia - ratio is 3; chol 145; LDL 75; trig 135 Diabetes - neg Family History - DM and MI; mother and father  Obesity neg  Fall risk no Given education on "Fall Prevention in the Home" for more safety tips the patient can apply as appropriate.  Long term goal is to "age in place" at this time    Mobility of Functional changes this year? no Safety reviewed  Community no issues wears sunscreen, states he should wear more; no hx of skin concerns; recommended putting sunscreen in golf bag safe place for firearms if they exist  Motor vehicle accidents; none  Mental Health:  Any emotional problems? Anxious, depressed, irritable, sad or blue? no Denies feeling depressed or hopeless; voices pleasure in daily life How many social activities have you been engaged in within the last 2 weeks? no    Hearing Screening   125Hz  250Hz  500Hz  1000Hz  2000Hz  3000Hz  4000Hz  6000Hz  8000Hz   Right ear:     100      Left ear:       100    Comments: Can have hearing  screen   Vision Screening Comments: Vision checks once a years Dr. Janne Napoleon    Activities of Daily Living - See functional screen   Cognitive testing; Ad8 score; 0 or less than 2  MMSE deferred or completed if AD8 + 2 issues  Advanced Directives will bring a copy at next OV  Patient Care Team: Marin Olp, MD as PCP - General (Family Medicine)   Immunization History  Administered Date(s) Administered  . Influenza Split 05/30/2011, 05/15/2012  . Influenza Whole 03/26/2008, 04/22/2010  . Influenza, High Dose Seasonal PF 04/26/2013, 04/15/2014, 04/28/2016  . Influenza,inj,Quad PF,36+ Mos 03/17/2015  . Pneumococcal Conjugate-13 04/15/2014  . Pneumococcal Polysaccharide-23 04/22/2010  . Td 07/12/1995, 03/26/2008  . Tdap 10/04/2016  . Zoster 03/26/2008   Required Immunizations needed today  Screening test up to date or reviewed for plan of completion There are no preventive care reminders to display for this patient.  Cardiac Risk Factors include: advanced age (>18men, >40 women);family history of premature cardiovascular disease     Objective:    Vitals: BP 120/82 (BP Location: Left Arm, Patient Position: Sitting, Cuff Size: Large)   Pulse 62   Temp 98.1 F (36.7 C) (Oral)   Ht 6' (1.829 m)   Wt 198 lb 6.4 oz (90 kg)   SpO2 97%   BMI 26.91 kg/m   Body mass index  is 26.91 kg/m.  Tobacco History  Smoking Status  . Never Smoker  Smokeless Tobacco  . Never Used     Counseling given: Not Answered   Past Medical History:  Diagnosis Date  . Coronary artery disease    a. ETT 9/13: + ischemic ECG changes;  b. ETT-MV 9/13: EF 62%;  + ischemic EKG changes and + inf ischemia;  c. LHC 9/13: pLAD 30%, mLAD 70-80%, oDx 40-50% ==> FFR hemodynamically significant stenosis in mLAD ==> PCI: Xience Xpedition DES to mLAD; Cath 06/01/12   single vessel CAD w/ patent mid LAD stent and normal LV systolic function  . Fibromyalgia    muscle aches in past in both  forearms, before statin even  . GERD (gastroesophageal reflux disease)   . Hyperlipidemia LDL goal < 70   . Nephrolithiasis   . Testosterone deficiency 04/28/2008   History treatment around 2009. No current treatment.      Past Surgical History:  Procedure Laterality Date  . CARDIAC CATHETERIZATION  04/06/2012; 06/01/2012  . CATARACT EXTRACTION W/ INTRAOCULAR LENS IMPLANT  08/2011   right .Left 12/2014.   Marland Kitchen CORONARY ANGIOPLASTY WITH STENT PLACEMENT  04/11/2012   DES LAD  . CYSTOSCOPY W/ STONE MANIPULATION  2009  . INGUINAL HERNIA REPAIR  1990's   right  . KNEE ARTHROSCOPY  1990's   right  . LEFT HEART CATHETERIZATION WITH CORONARY ANGIOGRAM N/A 06/01/2012   Procedure: LEFT HEART CATHETERIZATION WITH CORONARY ANGIOGRAM;  Surgeon: Burnell Blanks, MD;  Location: [facility] CATH LAB;  Service: Cardiovascular;  Laterality: N/A;  . LYMPHADENECTOMY Bilateral 08/18/2014   Procedure: LYMPHADENECTOMY;  Surgeon: Raynelle Bring, MD;  Location: [facility] ORS;  Service: Urology;  Laterality: Bilateral;  . PERCUTANEOUS CORONARY STENT INTERVENTION (PCI-S) N/A 04/11/2012   Procedure: PERCUTANEOUS CORONARY STENT INTERVENTION (PCI-S);  Surgeon: Burnell Blanks, MD;  Location: [facility] CATH LAB;  Service: Cardiovascular;  Laterality: N/A;  . ROBOT ASSISTED LAPAROSCOPIC RADICAL PROSTATECTOMY N/A 08/18/2014   Procedure: ROBOTIC ASSISTED LAPAROSCOPIC RADICAL PROSTATECTOMY LEVEL 2;  Surgeon: Raynelle Bring, MD;  Location: [facility];  Service: Urology;  Laterality: N/A;   Family History  Problem Relation Age of Onset  . Diabetes Mother   . Heart attack Mother 73  . Diabetes Father   . Heart attack Father 52  . Diabetes Sister   . Arthritis Brother    History  Sexual Activity  . Sexual activity: Not Currently    Outpatient Encounter Prescriptions as of 11/04/2016  Medication Sig  . aspirin EC 81 MG tablet Take 81 mg by mouth at bedtime.  . clopidogrel (PLAVIX) 75 MG tablet TAKE 1 TABLET BY MOUTH  DAILY  . Melatonin  3 MG TABS Take 3 mg by mouth at bedtime.  . methotrexate (RHEUMATREX) 2.5 MG tablet Take 10 mg by mouth once a week. Caution:Chemotherapy. Protect from light.  . nitroGLYCERIN (NITROSTAT) 0.4 MG SL tablet Place 1 tablet (0.4 mg total) under the tongue every 5 (five) minutes as needed for chest pain.  . pantoprazole (PROTONIX) 20 MG tablet Take 1 tablet (20 mg total) by mouth every morning.  . polyvinyl alcohol (LIQUIFILM TEARS) 1.4 % ophthalmic solution Place 1 drop into both eyes daily as needed for dry eyes.  . sodium chloride (OCEAN) 0.65 % SOLN nasal spray Place 1 spray into both nostrils daily as needed for congestion.  . [DISCONTINUED] atorvastatin (LIPITOR) 10 MG tablet TAKE 1 TABLET BY MOUTH  DAILY  . [DISCONTINUED] nitroGLYCERIN (NITROSTAT) 0.4 MG SL tablet Place 1 tablet (  0.4 mg total) under the tongue every 5 (five) minutes as needed for chest pain.  Marland Kitchen atorvastatin (LIPITOR) 20 MG tablet Take 1 tablet (20 mg total) by mouth daily.   No facility-administered encounter medications on file as of 11/04/2016.     Activities of Daily Living In your present state of health, do you have any difficulty performing the following activities: 11/04/2016  Hearing? N  Vision? N  Difficulty concentrating or making decisions? N  Walking or climbing stairs? N  Dressing or bathing? N  Doing errands, shopping? N  Preparing Food and eating ? N  Using the Toilet? N  In the past six months, have you accidently leaked urine? N  Do you have problems with loss of bowel control? N  Managing your Medications? N  Managing your Finances? N  Housekeeping or managing your Housekeeping? N  Some recent data might be hidden    Patient Care Team: Marin Olp, MD as PCP - General (Family Medicine)   Assessment:     Exercise Activities and Dietary recommendations Current Exercise Habits: Home exercise routine, Type of exercise: walking;strength training/weights, Time (Minutes): > 60, Frequency  (Times/Week): 4, Weekly Exercise (Minutes/Week): 0, Intensity: Moderate  Goals    . Weight (lb) < 190 lb (86.2 kg)          One year ago, was 185 Careful with diet, cut out sugar; bread, soft drinks  Does not add salt   Check out  online nutrition programs as GumSearch.nl and http://vang.com/; fit77me; Look for foods with "whole" wheat; bran; oatmeal etc Shot at the farmer's markets in season for fresher choices  Watch for "hydrogenated" on the label of oils which are trans-fats.  Watch for "high fructose corn syrup" in snacks, yogurt or ketchup  Meats have less marbling; bright colored fruits and vegetables;  Canned; dump out liquid and wash vegetables. Be mindful of what we are eating  Portion control is essential to a health weight! Sit down; take a break and enjoy your meal; take smaller bites; put the fork down between bites;  It takes 20 minutes to get full; so check in with your fullness cues and stop eating when you start to fill full             Fall Risk Fall Risk  11/04/2016 11/04/2016 10/27/2015 04/15/2014  Falls in the past year? No No No No   Depression Screen PHQ 2/9 Scores 11/04/2016 11/04/2016 10/27/2015 04/15/2014  PHQ - 2 Score 0 0 0 0    Cognitive Function MMSE - Mini Mental State Exam 11/04/2016  Not completed: (No Data)        Immunization History  Administered Date(s) Administered  . Influenza Split 05/30/2011, 05/15/2012  . Influenza Whole 03/26/2008, 04/22/2010  . Influenza, High Dose Seasonal PF 04/26/2013, 04/15/2014, 04/28/2016  . Influenza,inj,Quad PF,36+ Mos 03/17/2015  . Pneumococcal Conjugate-13 04/15/2014  . Pneumococcal Polysaccharide-23 04/22/2010  . Td 07/12/1995, 03/26/2008  . Tdap 10/04/2016  . Zoster 03/26/2008   Screening Tests Health Maintenance  Topic Date Due  . Hepatitis C Screening  04/12/2060 (Originally April 30, 1946)  . INFLUENZA VACCINE  02/08/2017  . COLONOSCOPY  08/11/2017  . TETANUS/TDAP  10/05/2026  . PNA vac  Low Risk Adult  Completed      Plan:      PCP Notes  Health Maintenance May have repeat colonoscopy next year   Abnormal Screens hearing was 2000 in right ear but has no issues; educated regarding free medicare screen;  Referrals none  Patient concerns; none  Nurse Concerns; none  Next PCP apt was today     I have personally reviewed and noted the following in the patient's chart:   . Medical and social history . Use of alcohol, tobacco or illicit drugs  . Current medications and supplements . Functional ability and status . Nutritional status . Physical activity . Advanced directives . List of other physicians . Hospitalizations, surgeries, and ER visits in previous 12 months . Vitals . Screenings to include cognitive, depression, and falls . Referrals and appointments  In addition, I have reviewed and discussed with patient certain preventive protocols, quality metrics, and best practice recommendations. A written personalized care plan for preventive services as well as general preventive health recommendations were provided to patient.     MMNOT,RRNHA, RN  11/04/2016  I have reviewed and agree with note, evaluation, plan.   Garret Reddish, MD

## 2016-11-04 NOTE — Patient Instructions (Addendum)
Increase atorvastatin to 20mg . Otherwise no changes  Things look great  Follow up 1 year   Richard Mahoney , Thank you for taking time to come for your Medicare Wellness Visit. I appreciate your ongoing commitment to your health goals. Please review the following plan we discussed and let me know if I can assist you in the future.   To bring copy of HCPOA and living will at next office visit   These are the goals we discussed: Goals    . Weight (lb) < 190 lb (86.2 kg)          One year ago, was 185 Careful with diet, cut out sugar; bread, soft drinks  Does not add salt   Check out  online nutrition programs as GumSearch.nl and http://vang.com/; fit61me; Look for foods with "whole" wheat; bran; oatmeal etc Shot at the farmer's markets in season for fresher choices  Watch for "hydrogenated" on the label of oils which are trans-fats.  Watch for "high fructose corn syrup" in snacks, yogurt or ketchup  Meats have less marbling; bright colored fruits and vegetables;  Canned; dump out liquid and wash vegetables. Be mindful of what we are eating  Portion control is essential to a health weight! Sit down; take a break and enjoy your meal; take smaller bites; put the fork down between bites;  It takes 20 minutes to get full; so check in with your fullness cues and stop eating when you start to fill full              This is a list of the screening recommended for you and due dates:  Health Maintenance  Topic Date Due  .  Hepatitis C: One time screening is recommended by Center for Disease Control  (CDC) for  adults born from [birth year] through 1967.   04/12/2060*  . Flu Shot  02/08/2017  . Colon Cancer Screening  08/11/2017  . Tetanus Vaccine  10/05/2026  . Pneumonia vaccines  Completed  *Topic was postponed. The date shown is not the original due date.    Health Maintenance, Male A healthy lifestyle and preventive care is important for your health and wellness. Ask your health  care provider about what schedule of regular examinations is right for you. What should I know about weight and diet?  Eat a Healthy Diet  Eat plenty of vegetables, fruits, whole grains, low-fat dairy products, and lean protein.  Do not eat a lot of foods high in solid fats, added sugars, or salt. Maintain a Healthy Weight  Regular exercise can help you achieve or maintain a healthy weight. You should:  Do at least 150 minutes of exercise each week. The exercise should increase your heart rate and make you sweat (moderate-intensity exercise).  Do strength-training exercises at least twice a week. Watch Your Levels of Cholesterol and Blood Lipids  Have your blood tested for lipids and cholesterol every 5 years starting at 73 years of age. If you are at high risk for heart disease, you should start having your blood tested when you are 73 years old. You may need to have your cholesterol levels checked more often if:  Your lipid or cholesterol levels are high.  You are older than 73 years of age.  You are at high risk for heart disease. What should I know about cancer screening? Many types of cancers can be detected early and may often be prevented. Lung Cancer  You should be screened every year for lung cancer  if:  You are a current smoker who has smoked for at least 30 years.  You are a former smoker who has quit within the past 15 years.  Talk to your health care provider about your screening options, when you should start screening, and how often you should be screened. Colorectal Cancer  Routine colorectal cancer screening usually begins at 73 years of age and should be repeated every 5-10 years until you are 73 years old. You may need to be screened more often if early forms of precancerous polyps or small growths are found. Your health care provider may recommend screening at an earlier age if you have risk factors for colon cancer.  Your health care provider may recommend  using home test kits to check for hidden blood in the stool.  A small camera at the end of a tube can be used to examine your colon (sigmoidoscopy or colonoscopy). This checks for the earliest forms of colorectal cancer. Prostate and Testicular Cancer  Depending on your age and overall health, your health care provider may do certain tests to screen for prostate and testicular cancer.  Talk to your health care provider about any symptoms or concerns you have about testicular or prostate cancer. Skin Cancer  Check your skin from head to toe regularly.  Tell your health care provider about any new moles or changes in moles, especially if:  There is a change in a mole's size, shape, or color.  You have a mole that is larger than a pencil eraser.  Always use sunscreen. Apply sunscreen liberally and repeat throughout the day.  Protect yourself by wearing long sleeves, pants, a wide-brimmed hat, and sunglasses when outside. What should I know about heart disease, diabetes, and high blood pressure?  If you are 98-49 years of age, have your blood pressure checked every 3-5 years. If you are 21 years of age or older, have your blood pressure checked every year. You should have your blood pressure measured twice-once when you are at a hospital or clinic, and once when you are not at a hospital or clinic. Record the average of the two measurements. To check your blood pressure when you are not at a hospital or clinic, you can use:  An automated blood pressure machine at a pharmacy.  A home blood pressure monitor.  Talk to your health care provider about your target blood pressure.  If you are between 69-72 years old, ask your health care provider if you should take aspirin to prevent heart disease.  Have regular diabetes screenings by checking your fasting blood sugar level.  If you are at a normal weight and have a low risk for diabetes, have this test once every three years after the age of  78.  If you are overweight and have a high risk for diabetes, consider being tested at a younger age or more often.  A one-time screening for abdominal aortic aneurysm (AAA) by ultrasound is recommended for men aged 19-75 years who are current or former smokers. What should I know about preventing infection? Hepatitis B  If you have a higher risk for hepatitis B, you should be screened for this virus. Talk with your health care provider to find out if you are at risk for hepatitis B infection. Hepatitis C  Blood testing is recommended for:  Everyone born from [birth year] through 1967.  Anyone with known risk factors for hepatitis C. Sexually Transmitted Diseases (STDs)  You should be screened each year for  STDs including gonorrhea and chlamydia if:  You are sexually active and are younger than 73 years of age.  You are older than 73 years of age and your health care provider tells you that you are at risk for this type of infection.  Your sexual activity has changed since you were last screened and you are at an increased risk for chlamydia or gonorrhea. Ask your health care provider if you are at risk.  Talk with your health care provider about whether you are at high risk of being infected with HIV. Your health care provider may recommend a prescription medicine to help prevent HIV infection. What else can I do?  Schedule regular health, dental, and eye exams.  Stay current with your vaccines (immunizations).  Do not use any tobacco products, such as cigarettes, chewing tobacco, and e-cigarettes. If you need help quitting, ask your health care provider.  Limit alcohol intake to no more than 2 drinks per day. One drink equals 12 ounces of beer, 5 ounces of wine, or 1 ounces of hard liquor.  Do not use street drugs.  Do not share needles.  Ask your health care provider for help if you need support or information about quitting drugs.  Tell your health care provider if you often  feel depressed.  Tell your health care provider if you have ever been abused or do not feel safe at home. This information is not intended to replace advice given to you by your health care provider. Make sure you discuss any questions you have with your health care provider. Document Released: 12/24/2007 Document Revised: 02/24/2016 Document Reviewed: 03/31/2015 Elsevier Interactive Patient Education  2017 [place] Prevention in the Home Falls can cause injuries and can affect people from all age groups. There are many simple things that you can do to make your home safe and to help prevent falls. What can I do on the outside of my home?  Regularly repair the edges of walkways and driveways and fix any cracks.  Remove high doorway thresholds.  Trim any shrubbery on the main path into your home.  Use bright outdoor lighting.  Clear walkways of debris and clutter, including tools and rocks.  Regularly check that handrails are securely fastened and in good repair. Both sides of any steps should have handrails.  Install guardrails along the edges of any raised decks or porches.  Have leaves, snow, and ice cleared regularly.  Use sand or salt on walkways during winter months.  In the garage, clean up any spills right away, including grease or oil spills. What can I do in the bathroom?  Use night lights.  Install grab bars by the toilet and in the tub and shower. Do not use towel bars as grab bars.  Use non-skid mats or decals on the floor of the tub or shower.  If you need to sit down while you are in the shower, use a plastic, non-slip stool.  Keep the floor dry. Immediately clean up any water that spills on the floor.  Remove soap buildup in the tub or shower on a regular basis.  Attach bath mats securely with double-sided non-slip rug tape.  Remove throw rugs and other tripping hazards from the floor. What can I do in the bedroom?  Use night  lights.  Make sure that a bedside light is easy to reach.  Do not use oversized bedding that drapes onto the floor.  Have a firm chair that has  side arms to use for getting dressed.  Remove throw rugs and other tripping hazards from the floor. What can I do in the kitchen?  Clean up any spills right away.  Avoid walking on wet floors.  Place frequently used items in easy-to-reach places.  If you need to reach for something above you, use a sturdy step stool that has a grab bar.  Keep electrical cables out of the way.  Do not use floor polish or wax that makes floors slippery. If you have to use wax, make sure that it is non-skid floor wax.  Remove throw rugs and other tripping hazards from the floor. What can I do in the stairways?  Do not leave any items on the stairs.  Make sure that there are handrails on both sides of the stairs. Fix handrails that are broken or loose. Make sure that handrails are as long as the stairways.  Check any carpeting to make sure that it is firmly attached to the stairs. Fix any carpet that is loose or worn.  Avoid having throw rugs at the top or bottom of stairways, or secure the rugs with carpet tape to prevent them from moving.  Make sure that you have a light switch at the top of the stairs and the bottom of the stairs. If you do not have them, have them installed. What are some other fall prevention tips?  Wear closed-toe shoes that fit well and support your feet. Wear shoes that have rubber soles or low heels.  When you use a stepladder, make sure that it is completely opened and that the sides are firmly locked. Have someone hold the ladder while you are using it. Do not climb a closed stepladder.  Add color or contrast paint or tape to grab bars and handrails in your home. Place contrasting color strips on the first and last steps.  Use mobility aids as needed, such as canes, walkers, scooters, and crutches.  Turn on lights if it is  dark. Replace any light bulbs that burn out.  Set up furniture so that there are clear paths. Keep the furniture in the same spot.  Fix any uneven floor surfaces.  Choose a carpet design that does not hide the edge of steps of a stairway.  Be aware of any and all pets.  Review your medicines with your healthcare provider. Some medicines can cause dizziness or changes in blood pressure, which increase your risk of falling. Talk with your health care provider about other ways that you can decrease your risk of falls. This may include working with a physical therapist or trainer to improve your strength, balance, and endurance. This information is not intended to replace advice given to you by your health care provider. Make sure you discuss any questions you have with your health care provider. Document Released: 06/17/2002 Document Revised: 11/24/2015 Document Reviewed: 08/01/2014 Elsevier Interactive Patient Education  2017 [employer].

## 2016-11-04 NOTE — Assessment & Plan Note (Signed)
PMR- diagnosed Dr. Dossie Der- methotrexate only.  prednisone started about a year ago now off

## 2016-11-07 ENCOUNTER — Encounter: Payer: Self-pay | Admitting: Family Medicine

## 2016-11-07 ENCOUNTER — Telehealth: Payer: Self-pay | Admitting: Family Medicine

## 2016-11-07 NOTE — Telephone Encounter (Signed)
Discussed with patient who verbalized understanding. Patient states that he received an alert at around 1:00am yesterday stating his PSA is 1.61. That is believed to be an old reading. Confirmed with patient that record shows undetectable level currently.

## 2016-11-07 NOTE — Telephone Encounter (Signed)
Pt was seen on 11/04/16 and is calling to discuss PSA discrepancies between our office and urologist dr borden. Pt would like jamie to return his call

## 2016-11-09 ENCOUNTER — Other Ambulatory Visit: Payer: Self-pay | Admitting: Family Medicine

## 2016-11-09 MED ORDER — PANTOPRAZOLE SODIUM 20 MG PO TBEC: 20.0000 mg | DELAYED_RELEASE_TABLET | Freq: Every morning | ORAL | 1 refills | Status: DC

## 2016-11-09 NOTE — Telephone Encounter (Signed)
Received fax request from [facility].  Filled for 6 months per protocol.  Pt had last cpx on 11/04/16.

## 2016-11-10 ENCOUNTER — Encounter: Payer: Self-pay | Admitting: Family Medicine

## 2016-11-22 NOTE — Telephone Encounter (Signed)
Called to see if pt wanted to schedule awv - left message.  

## 2017-01-23 ENCOUNTER — Other Ambulatory Visit: Payer: Self-pay | Admitting: Family Medicine

## 2017-01-25 ENCOUNTER — Telehealth: Payer: Self-pay | Admitting: Cardiovascular Disease

## 2017-01-25 NOTE — Telephone Encounter (Signed)
Pt is calling requesting a refill on Alprazolam 0.5 mg tablet. Dr. Angelena Form refilled this medication in the past. Would you like to refill this medication/ please advise

## 2017-01-25 NOTE — Telephone Encounter (Signed)
°*  STAT* If patient is at the pharmacy, call can be transferred to refill team.   1. Which medications need to be refilled? (please list name of each medication and dose if known) Alparzolam 0.5mg  tablets . 1 Tablet twice a day   2. Which pharmacy/location (including street and city if local pharmacy) is medication to be sent to?[facility]   3. Do they need a 30 day or 90 day supply? [place]

## 2017-01-27 MED ORDER — ALPRAZOLAM 0.5 MG PO TABS: 0.5000 mg | ORAL_TABLET | Freq: Two times a day (BID) | ORAL | 0 refills | Status: DC | PRN

## 2017-01-27 MED ORDER — ALPRAZOLAM 0.5 MG PO TABS: 0.5000 mg | ORAL_TABLET | Freq: Every evening | ORAL | 2 refills | Status: DC | PRN

## 2017-01-27 NOTE — Telephone Encounter (Signed)
Per Dr. Angelena Form, pt's Alprazolam 0.5 mg tablet was faxed to pt's pharmacy as requested. [facility] mail order.  Confirmation received.

## 2017-01-27 NOTE — Telephone Encounter (Signed)
I can refill this for him. Richard Mahoney

## 2017-04-13 DIAGNOSIS — E559 Vitamin D deficiency, unspecified: Secondary | ICD-10-CM | POA: Diagnosis not present

## 2017-04-13 DIAGNOSIS — Z23 Encounter for immunization: Secondary | ICD-10-CM | POA: Diagnosis not present

## 2017-04-13 DIAGNOSIS — R739 Hyperglycemia, unspecified: Secondary | ICD-10-CM | POA: Diagnosis not present

## 2017-04-13 DIAGNOSIS — M353 Polymyalgia rheumatica: Secondary | ICD-10-CM | POA: Diagnosis not present

## 2017-04-13 DIAGNOSIS — Z7952 Long term (current) use of systemic steroids: Secondary | ICD-10-CM | POA: Diagnosis not present

## 2017-04-17 LAB — BASIC METABOLIC PANEL
BUN: 21 (ref 4–21)
CREATININE: 1.2 (ref 0.6–1.3)
GLUCOSE: 94
Potassium: 4.4 (ref 3.4–5.3)
Sodium: 142 (ref 137–147)

## 2017-04-17 LAB — HEPATIC FUNCTION PANEL
ALT: 25 (ref 10–40)
AST: 22 (ref 14–40)
Alkaline Phosphatase: 74 (ref 25–125)
Bilirubin, Total: 0.6

## 2017-04-17 LAB — CBC AND DIFFERENTIAL
HEMATOCRIT: 42 (ref 41–53)
Hemoglobin: 14.2 (ref 13.5–17.5)
PLATELETS: 241 (ref 150–399)
WBC: 5.8

## 2017-04-25 ENCOUNTER — Encounter: Payer: Self-pay | Admitting: Family Medicine

## 2017-05-03 DIAGNOSIS — N5201 Erectile dysfunction due to arterial insufficiency: Secondary | ICD-10-CM | POA: Diagnosis not present

## 2017-05-03 DIAGNOSIS — Z8546 Personal history of malignant neoplasm of prostate: Secondary | ICD-10-CM | POA: Diagnosis not present

## 2017-05-09 ENCOUNTER — Encounter: Payer: Self-pay | Admitting: Family Medicine

## 2017-08-04 ENCOUNTER — Encounter: Payer: Self-pay | Admitting: Cardiovascular Disease

## 2017-08-04 ENCOUNTER — Encounter: Payer: Self-pay | Admitting: Family Medicine

## 2017-08-04 NOTE — Telephone Encounter (Signed)
I called pt and scheduled him to see Dr. Angelena Form on 09/11/17 at 1:40

## 2017-08-16 DIAGNOSIS — Z01818 Encounter for other preprocedural examination: Secondary | ICD-10-CM | POA: Diagnosis not present

## 2017-08-16 DIAGNOSIS — Z1211 Encounter for screening for malignant neoplasm of colon: Secondary | ICD-10-CM | POA: Diagnosis not present

## 2017-08-17 ENCOUNTER — Telehealth: Payer: Self-pay | Admitting: *Deleted

## 2017-08-17 NOTE — Telephone Encounter (Signed)
   Primary Cardiologist:No primary care provider on file.  Chart reviewed as part of pre-operative protocol coverage. Because of Richard Mahoney's past medical history and time since last visit, he/she will require a follow-up visit in order to better assess preoperative cardiovascular risk.  Pre-op covering staff: - Please schedule appointment and call patient to inform them. - Please contact requesting surgeon's office via preferred method (i.e, phone, fax) to inform them of need for appointment prior to surgery.  I will also route this to Dr. Angelena Form so he can comment on holding ASA and plavix for 5 days as requested.  Tami Lin Duke, PA  08/17/2017, 2:21 PM

## 2017-08-17 NOTE — Telephone Encounter (Signed)
OK to hold ASA and Plavix for the procedure. Meral Geissinger  

## 2017-08-17 NOTE — Telephone Encounter (Signed)
Left message for patient to call back to schedule appointment for a sooner date with either MD or care team APP Rosita Fire, [place]; Idolina Primer, [place]; [place], [place])  Called GI office and notified staff that patient will need appointment for clearance - waiting on call back from patient to schedule.

## 2017-08-17 NOTE — Telephone Encounter (Signed)
   [facility] Pre-operative Risk Assessment    Request for surgical clearance:  1. What type of surgery is being performed? COLONOSCOPY    2. When is this surgery scheduled? 09/07/17   3. What type of clearance is required (medical clearance vs. Pharmacy clearance to hold med vs. Both)? MEDICATION  4. Are there any medications that need to be held prior to surgery and how long? ASPRIN AND PLAVIX TO BE HELD STARTING ON Sep 02, 2017   5. Practice name and name of physician performing surgery? [facility] --DR. Thornton Park MD   6. What is your office phone and fax number? [phone] FAX--[phone]   7. Anesthesia type (None, local, MAC, general) ? NOT SPECIFIED   Nuala Alpha 08/17/2017, 12:13 PM  _________________________________________________________________   (provider comments below)

## 2017-08-18 NOTE — Telephone Encounter (Signed)
Called patient and made him aware that he will need appointment in order to be cleared for his colonoscopy on 2/28. patient states that his colonoscopy has been rescheduled to March 19 th and he already has appointment with Dr. Angelena Form on March 4th. Dr. Angelena Form will address clearance at that appointment.

## 2017-08-24 DIAGNOSIS — L821 Other seborrheic keratosis: Secondary | ICD-10-CM | POA: Diagnosis not present

## 2017-08-24 DIAGNOSIS — L814 Other melanin hyperpigmentation: Secondary | ICD-10-CM | POA: Diagnosis not present

## 2017-08-31 ENCOUNTER — Telehealth: Payer: Self-pay | Admitting: Cardiovascular Disease

## 2017-08-31 NOTE — Telephone Encounter (Signed)
New message    Call patient to R/s appt due to MD schedule change on  3/4.   Patient is not happy his appt is been move out to May.   Offer APP appt - patient refuse.   Patient states he needs to be release from MD to have a colonoscopy.   Patient advise to have a message sent to MD nurse.

## 2017-08-31 NOTE — Telephone Encounter (Signed)
Called patient to let him know his message would be sent to [facility], Dr. Camillia Herter nurse, and she would get in touch with him when she is back in the office. Patient verbalized understanding.

## 2017-09-01 NOTE — Telephone Encounter (Signed)
I spoke with pt and told him first day Dr. Angelena Form is back in office is March 13,2019.  I scheduled pt to see Dr. Angelena Form on March 13 at 11:00

## 2017-09-11 ENCOUNTER — Ambulatory Visit: Payer: Commercial Managed Care - PPO | Admitting: Cardiovascular Disease

## 2017-09-13 ENCOUNTER — Telehealth: Payer: Self-pay

## 2017-09-13 NOTE — Telephone Encounter (Signed)
   [facility] Pre-operative Risk Assessment    Request for surgical clearance:  1. What type of surgery is being performed? Colonoscopy  2. When is this surgery scheduled? 09/26/17   3. What type of clearance is required (medical clearance vs. Pharmacy clearance to hold med vs. Both)? Medical and Pharmacy to hold Plavix and Aspirin  4. Are there any medications that need to be held prior to surgery and how long? Plavix and Aspirin  5. Practice name and name of physician performing surgery? [facility]; Dr. Christia Reading M[place]   6. What is your office phone and fax number? Phone [phone] and Fax [phone]   7. Anesthesia type (None, local, MAC, general) ? Not stated   Mady Haagensen 09/13/2017, 12:30 PM  _________________________________________________________________   (provider comments below)

## 2017-09-14 NOTE — Telephone Encounter (Signed)
Patient has not been seen for the past year but has upcoming visit with Dr. Angelena Form on 09/20/2017, this preop and recommendation for ASA and plavix can be addressed during the cardiology visit

## 2017-09-15 NOTE — Telephone Encounter (Signed)
Left message at [facility] informing them that pateint has an appointment with 3.13.19 with Dr Angelena Form who will discuss patients cardiac clearance at that time advised to contact office for additional questions or concerns.

## 2017-09-18 NOTE — Telephone Encounter (Signed)
Thanks

## 2017-09-20 ENCOUNTER — Ambulatory Visit (INDEPENDENT_AMBULATORY_CARE_PROVIDER_SITE_OTHER): Payer: Commercial Managed Care - PPO | Admitting: Cardiovascular Disease

## 2017-09-20 ENCOUNTER — Encounter: Payer: Self-pay | Admitting: Cardiovascular Disease

## 2017-09-20 VITALS — BP 118/72 | HR 60 | Ht 72.0 in | Wt 195.0 lb

## 2017-09-20 DIAGNOSIS — I2510 Atherosclerotic heart disease of native coronary artery without angina pectoris: Secondary | ICD-10-CM | POA: Diagnosis not present

## 2017-09-20 DIAGNOSIS — Z01810 Encounter for preprocedural cardiovascular examination: Secondary | ICD-10-CM | POA: Diagnosis not present

## 2017-09-20 DIAGNOSIS — E7800 Pure hypercholesterolemia, unspecified: Secondary | ICD-10-CM

## 2017-09-20 NOTE — Progress Notes (Signed)
Chief Complaint  Patient presents with  . Follow-up    CAD    History of Present Illness: 72 yo male with history of CAD, kidney stones, GERD here today for cardiac follow up. Initially seen in 2013 after abnormal stress test. Stress myoview 04/02/12 with ST depression at peak exercise and inferior wall defect concerning for ischemia. Cardiac cath on 04/06/12 with moderately severe mid LAD lesion involving the diagonal branch. He was brought back for PCI on 04/11/12 and FFR of the LAD was 0.77. A Xience Expedition (2.75 mm x 23 mm) drug eluting stent was placed in the mid LAD and angioplasty was performed on the ostium of the diagonal. Recurrent chest pain November 2013 with cardiac cath 06/01/12 demonstrating stable disease with patency of LAD stent. Stress test in February 2017 in [place] and he had no ischemia.   He is here today for follow up. The patient denies any chest pain, dyspnea, palpitations, lower extremity edema, orthopnea, PND, dizziness, near syncope or syncope. He is exercising every day.    Primary Care Physician: Marin Olp, MD  Past Medical History:  Diagnosis Date  . Coronary artery disease    a. ETT 9/13: + ischemic ECG changes;  b. ETT-MV 9/13: EF 62%;  + ischemic EKG changes and + inf ischemia;  c. LHC 9/13: pLAD 30%, mLAD 70-80%, oDx 40-50% ==> FFR hemodynamically significant stenosis in mLAD ==> PCI: Xience Xpedition DES to mLAD; Cath 06/01/12   single vessel CAD w/ patent mid LAD stent and normal LV systolic function  . Fibromyalgia    muscle aches in past in both forearms, before statin even  . GERD (gastroesophageal reflux disease)   . Hyperlipidemia LDL goal < 70   . Nephrolithiasis   . Testosterone deficiency 04/28/2008   History treatment around 2009. No current treatment.       Past Surgical History:  Procedure Laterality Date  . CARDIAC CATHETERIZATION  04/06/2012; 06/01/2012  . CATARACT EXTRACTION W/ INTRAOCULAR LENS IMPLANT  08/2011   right  .Left 12/2014.   Marland Kitchen CORONARY ANGIOPLASTY WITH STENT PLACEMENT  04/11/2012   DES LAD  . CYSTOSCOPY W/ STONE MANIPULATION  2009  . INGUINAL HERNIA REPAIR  1990's   right  . KNEE ARTHROSCOPY  1990's   right  . LEFT HEART CATHETERIZATION WITH CORONARY ANGIOGRAM N/A 06/01/2012   Procedure: LEFT HEART CATHETERIZATION WITH CORONARY ANGIOGRAM;  Surgeon: Burnell Blanks, MD;  Location: [facility] CATH LAB;  Service: Cardiovascular;  Laterality: N/A;  . LYMPHADENECTOMY Bilateral 08/18/2014   Procedure: LYMPHADENECTOMY;  Surgeon: Raynelle Bring, MD;  Location: [facility] ORS;  Service: Urology;  Laterality: Bilateral;  . PERCUTANEOUS CORONARY STENT INTERVENTION (PCI-S) N/A 04/11/2012   Procedure: PERCUTANEOUS CORONARY STENT INTERVENTION (PCI-S);  Surgeon: Burnell Blanks, MD;  Location: [facility] CATH LAB;  Service: Cardiovascular;  Laterality: N/A;  . ROBOT ASSISTED LAPAROSCOPIC RADICAL PROSTATECTOMY N/A 08/18/2014   Procedure: ROBOTIC ASSISTED LAPAROSCOPIC RADICAL PROSTATECTOMY LEVEL 2;  Surgeon: Raynelle Bring, MD;  Location: [facility] ORS;  Service: Urology;  Laterality: N/A;    Current Outpatient Medications  Medication Sig Dispense Refill  . aspirin EC 81 MG tablet Take 81 mg by mouth at bedtime.    Marland Kitchen atorvastatin (LIPITOR) 20 MG tablet Take 1 tablet (20 mg total) by mouth daily. 90 tablet 3  . clopidogrel (PLAVIX) 75 MG tablet TAKE 1 TABLET BY MOUTH  DAILY 90 tablet 3  . Melatonin 3 MG TABS Take 3 mg by mouth at bedtime.    Marland Kitchen  nitroGLYCERIN (NITROSTAT) 0.4 MG SL tablet Place 1 tablet (0.4 mg total) under the tongue every 5 (five) minutes as needed for chest pain. 25 tablet 2  . pantoprazole (PROTONIX) 20 MG tablet TAKE 1 TABLET BY MOUTH  EVERY MORNING 90 tablet 1  . polyvinyl alcohol (LIQUIFILM TEARS) 1.4 % ophthalmic solution Place 1 drop into both eyes daily as needed for dry eyes.    . sodium chloride (OCEAN) 0.65 % SOLN nasal spray Place 1 spray into both nostrils daily as needed for congestion.     No current  facility-administered medications for this visit.     Allergies  Allergen Reactions  . Sulfa Antibiotics Rash    rash    Social History   Socioeconomic History  . Marital status: Married    Spouse name: Not on file  . Number of children: 2  . Years of education: Not on file  . Highest education level: Not on file  Social Needs  . Financial resource strain: Not on file  . Food insecurity - worry: Not on file  . Food insecurity - inability: Not on file  . Transportation needs - medical: Not on file  . Transportation needs - non-medical: Not on file  Occupational History  . Occupation: Retired  Tobacco Use  . Smoking status: Never Smoker  . Smokeless tobacco: Never Used  Substance and Sexual Activity  . Alcohol use: No  . Drug use: No  . Sexual activity: Not Currently  Other Topics Concern  . Not on file  Social History Narrative   Married (brother patient here, wife patient elsewhere). 2 children (daughter in [place], son in [place] with ticketmaster).       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 Diabetes Mother   . Heart attack Mother 41  . Diabetes Father   . Heart attack Father 20  . Diabetes Sister   . Arthritis Brother     Review of Systems:  As stated in the HPI and otherwise negative.   BP 118/72 (BP Location: Left Arm, Patient Position: Sitting, Cuff Size: Normal)   Pulse 60   Ht 6' (1.829 m)   Wt 195 lb (88.5 kg)   SpO2 96%   BMI 26.45 kg/m   Physical Examination:  General: Well developed, well nourished, NAD  HEENT: OP clear, mucus membranes moist  SKIN: warm, dry. No rashes. Neuro: No focal deficits  Musculoskeletal: Muscle strength 5/5 all ext  Psychiatric: Mood and affect normal  Neck: No JVD, no carotid bruits, no thyromegaly, no lymphadenopathy.  Lungs:Clear bilaterally, no  wheezes, rhonci, crackles Cardiovascular: Regular rate and rhythm. No murmurs, gallops or rubs. Abdomen:Soft. Bowel sounds present. Non-tender.  Extremities: No lower extremity edema. Pulses are 2 + in the bilateral DP/PT.  Cardiac cath 06/01/12:  Left main: No obstructive disease noted.  Left Anterior Descending Artery: Moderate caliber vessel that courses to the apex. The proximal vessel has a 30% stenosis. The mid vessel has a patent stented segment with no evidence of restenosis. The distal LAD has no obstructive disease. The diagonal branch is covered by the LAD stent and has a 10% ostial stenosis. There is excellent flow down the diagonal branch.  Intermediate Artery: Small caliber vessel with 30% stenosis.  Circumflex Artery: Large caliber, co-dominant vessel with no obstructive disease noted. There are  three small caliber obtuse marginal branches that have no obstructive disease noted.  Right Coronary Artery: Small caliber, co-dominant vessel with mild plaque in the proximal and mid segments.  Left Ventricular Angiogram: LVEF=60-65%   EKG:  EKG is ordered today. The ekg ordered today demonstrates  NSR, rate 60 bpm.   Recent Labs: 04/17/2017: ALT 25; BUN 21; Creatinine 1.2; Hemoglobin 14.2; Platelets 241; Potassium 4.4; Sodium 142   Lipid Panel    Component Value Date/Time   CHOL 145 10/28/2016 0855   TRIG 135.0 10/28/2016 0855   HDL 42.20 10/28/2016 0855   CHOLHDL 3 10/28/2016 0855   VLDL 27.0 10/28/2016 0855   LDLCALC 75 10/28/2016 0855   LDLDIRECT 86.0 10/16/2015 0841     Wt Readings from Last 3 Encounters:  09/20/17 195 lb (88.5 kg)  11/04/16 198 lb 6.4 oz (90 kg)  09/09/16 199 lb 3.2 oz (90.4 kg)     Other studies Reviewed: Additional studies/ records that were reviewed today include: . Review of the above records demonstrates:    Assessment and Plan:   1. CAD without angina: No chest pain suggestive of angina. Will continue Plavix. He is not on a beta blocker  due to bradycardia. Will stop ASA since he is five years out from his stent.   2. HLD: Lipids are followed in primary care and well controlled. Continue statin  3. Pre-operative cardiovascular examination: He can proceed with his planned colonoscopy. OK to hold ASA and Plavix five days before his procedure.   Current medicines are reviewed at length with the patient today.  The patient does not have concerns regarding medicines.  The following changes have been made:  no change  Labs/ tests ordered today include:   Orders Placed This Encounter  Procedures  . EKG 12-Lead     Disposition:   FU with me in 12  months   Signed, Lauree Chandler, MD 09/20/2017 11:42 AM    [facility] [place], [place], [place]  [address] Phone: [phone]; Fax: [phone]

## 2017-09-20 NOTE — Telephone Encounter (Signed)
Left message on nurse line that pt saw Dr. Angelena Form today and has been cleared for colonoscopy. OK for pt to hold ASA and Plavix 5 days prior to procedure.  Message left that office note from today had been faxed.  Left message to call office if questions.

## 2017-09-20 NOTE — Patient Instructions (Signed)
Medication Instructions:  Your physician recommends that you continue on your current medications as directed. Please refer to the Current Medication list given to you today.   Labwork: none  Testing/Procedures: none  Follow-Up: Your physician recommends that you schedule a follow-up appointment in: about 12 months. Please call our office in December to schedule this appointment.     Any Other Special Instructions Will Be Listed Below (If Applicable).     If you need a refill on your cardiac medications before your next appointment, please call your pharmacy.

## 2017-09-23 ENCOUNTER — Other Ambulatory Visit: Payer: Self-pay | Admitting: Cardiovascular Disease

## 2017-09-23 ENCOUNTER — Other Ambulatory Visit: Payer: Self-pay | Admitting: Family Medicine

## 2017-09-26 ENCOUNTER — Encounter: Payer: Self-pay | Admitting: Family Medicine

## 2017-09-26 LAB — HM COLONOSCOPY

## 2017-09-29 ENCOUNTER — Encounter: Payer: Self-pay | Admitting: Family Medicine

## 2017-10-30 ENCOUNTER — Ambulatory Visit: Payer: Commercial Managed Care - PPO | Admitting: Cardiovascular Disease

## 2017-10-31 ENCOUNTER — Encounter: Payer: Self-pay | Admitting: Family Medicine

## 2017-10-31 ENCOUNTER — Ambulatory Visit (INDEPENDENT_AMBULATORY_CARE_PROVIDER_SITE_OTHER): Payer: Commercial Managed Care - PPO | Admitting: Family Medicine

## 2017-10-31 VITALS — BP 134/82 | HR 75 | Temp 97.9°F | Ht 72.0 in | Wt 197.6 lb

## 2017-10-31 DIAGNOSIS — M353 Polymyalgia rheumatica: Secondary | ICD-10-CM | POA: Diagnosis not present

## 2017-10-31 DIAGNOSIS — Z0000 Encounter for general adult medical examination without abnormal findings: Secondary | ICD-10-CM | POA: Diagnosis not present

## 2017-10-31 DIAGNOSIS — K219 Gastro-esophageal reflux disease without esophagitis: Secondary | ICD-10-CM

## 2017-10-31 DIAGNOSIS — E785 Hyperlipidemia, unspecified: Secondary | ICD-10-CM | POA: Diagnosis not present

## 2017-10-31 DIAGNOSIS — Z79899 Other long term (current) drug therapy: Secondary | ICD-10-CM | POA: Diagnosis not present

## 2017-10-31 DIAGNOSIS — E538 Deficiency of other specified B group vitamins: Secondary | ICD-10-CM | POA: Insufficient documentation

## 2017-10-31 LAB — COMPREHENSIVE METABOLIC PANEL
ALBUMIN: 4.2 g/dL (ref 3.5–5.2)
ALT: 20 U/L (ref 0–53)
AST: 19 U/L (ref 0–37)
Alkaline Phosphatase: 64 U/L (ref 39–117)
BUN: 19 mg/dL (ref 6–23)
CALCIUM: 9.2 mg/dL (ref 8.4–10.5)
CHLORIDE: 102 meq/L (ref 96–112)
CO2: 31 mEq/L (ref 19–32)
Creatinine, Ser: 1.09 mg/dL (ref 0.40–1.50)
GFR: 70.25 mL/min (ref >60.00)
Glucose, Bld: 94 mg/dL (ref 70–99)
POTASSIUM: 4.7 meq/L (ref 3.5–5.1)
Sodium: 138 mEq/L (ref 135–145)
Total Bilirubin: 0.8 mg/dL (ref 0.2–1.2)
Total Protein: 7.1 g/dL (ref 6.0–8.3)

## 2017-10-31 LAB — CBC
HCT: 43.7 % (ref 39.0–52.0)
Hemoglobin: 15 g/dL (ref 13.0–17.0)
MCHC: 34.2 g/dL (ref 30.0–36.0)
MCV: 91.3 fl (ref 78.0–100.0)
PLATELETS: 241 10*3/uL (ref 150.0–400.0)
RBC: 4.79 Mil/uL (ref 4.22–5.81)
RDW: 12.8 % (ref 11.5–15.5)
WBC: 4.8 10*3/uL (ref 4.0–10.5)

## 2017-10-31 LAB — LIPID PANEL
CHOLESTEROL: 138 mg/dL (ref 0–200)
HDL: 47 mg/dL (ref >39.00)
LDL CALC: 63 mg/dL (ref 0–99)
NonHDL: 90.74
TRIGLYCERIDES: 137 mg/dL (ref 0.0–149.0)
Total CHOL/HDL Ratio: 3
VLDL: 27.4 mg/dL (ref 0.0–40.0)

## 2017-10-31 LAB — VITAMIN B12: Vitamin B-12: 183 pg/mL — ABNORMAL LOW (ref 211–911)

## 2017-10-31 MED ORDER — NITROGLYCERIN 0.4 MG SL SUBL: 0.4000 mg | SUBLINGUAL_TABLET | SUBLINGUAL | 2 refills | Status: DC | PRN

## 2017-10-31 NOTE — Progress Notes (Signed)
Your B12 was in fact low.  This can cause symptoms like fatigue or numbness and tingling.  I would suggest weekly injections for 4 weeks to get the level up.  Then would be reasonable to take a B12 supplement with 1000 mcg/day for a month.  Our team can set you up for a repeat B12 level in 8 weeks or so.   Your CBC was normal (blood counts, infection fighting cells, platelets). Your CMET was normal (kidney, liver, and electrolytes, blood sugar).  Your cholesterol looks fantastic and is at ideal levels

## 2017-10-31 NOTE — Assessment & Plan Note (Signed)
PMR- diagnosed Dr. Dossie Der- methotrexate only last year- now he is completley off that and has been off prednisone for well over a year now. Still checks in every 6 months. No recurrence of headaches

## 2017-10-31 NOTE — Assessment & Plan Note (Signed)
he is on 20mg  atorvastatin. Update LDL- hopeful under 70. Leave dose the same unless LDL above 80.

## 2017-10-31 NOTE — Progress Notes (Signed)
Phone: [phone]  Subjective:  Patient presents today for their annual physical. Chief complaint-noted.   See problem oriented charting- ROS- full  review of systems was completed and negative including No chest pain or shortness of breath. No headache or blurry vision.    The following were reviewed and entered/updated in epic: Past Medical History:  Diagnosis Date  . Coronary artery disease    a. ETT 9/13: + ischemic ECG changes;  b. ETT-MV 9/13: EF 62%;  + ischemic EKG changes and + inf ischemia;  c. LHC 9/13: pLAD 30%, mLAD 70-80%, oDx 40-50% ==> FFR hemodynamically significant stenosis in mLAD ==> PCI: Xience Xpedition DES to mLAD; Cath 06/01/12   single vessel CAD w/ patent mid LAD stent and normal LV systolic function  . Fibromyalgia    muscle aches in past in both forearms, before statin even  . GERD (gastroesophageal reflux disease)   . Hyperlipidemia LDL goal < 70   . Nephrolithiasis   . Testosterone deficiency 04/28/2008   History treatment around 2009. No current treatment.      Patient Active Problem List   Diagnosis Date Noted  . PMR (polymyalgia rheumatica) (HCC) 11/20/2015    Priority: High  . History of prostate cancer 08/18/2014    Priority: High  . Coronary atherosclerosis of native coronary artery 04/12/2012    Priority: High  . GERD (gastroesophageal reflux disease) 03/17/2015    Priority: Medium  . Bilateral occipital neuralgia 01/22/2014    Priority: Medium  . Hyperlipidemia 04/25/2012    Priority: Medium  . Insomnia 04/22/2010    Priority: Low  . NEPHROLITHIASIS, HX OF 03/26/2008    Priority: Low   Past Surgical History:  Procedure Laterality Date  . CARDIAC CATHETERIZATION  04/06/2012; 06/01/2012  . CATARACT EXTRACTION W/ INTRAOCULAR LENS IMPLANT  08/2011   right .Left 12/2014.   Marland Kitchen CORONARY ANGIOPLASTY WITH STENT PLACEMENT  04/11/2012   DES LAD  . CYSTOSCOPY W/ STONE MANIPULATION  2009  . INGUINAL HERNIA REPAIR  1990's   right  . KNEE  ARTHROSCOPY  1990's   right  . LEFT HEART CATHETERIZATION WITH CORONARY ANGIOGRAM N/A 06/01/2012   Procedure: LEFT HEART CATHETERIZATION WITH CORONARY ANGIOGRAM;  Surgeon: Burnell Blanks, MD;  Location: [facility] CATH LAB;  Service: Cardiovascular;  Laterality: N/A;  . LYMPHADENECTOMY Bilateral 08/18/2014   Procedure: LYMPHADENECTOMY;  Surgeon: Raynelle Bring, MD;  Location: [facility] ORS;  Service: Urology;  Laterality: Bilateral;  . PERCUTANEOUS CORONARY STENT INTERVENTION (PCI-S) N/A 04/11/2012   Procedure: PERCUTANEOUS CORONARY STENT INTERVENTION (PCI-S);  Surgeon: Burnell Blanks, MD;  Location: [facility] CATH LAB;  Service: Cardiovascular;  Laterality: N/A;  . ROBOT ASSISTED LAPAROSCOPIC RADICAL PROSTATECTOMY N/A 08/18/2014   Procedure: ROBOTIC ASSISTED LAPAROSCOPIC RADICAL PROSTATECTOMY LEVEL 2;  Surgeon: Raynelle Bring, MD;  Location: [facility] ORS;  Service: Urology;  Laterality: N/A;    Family History  Problem Relation Age of Onset  . Diabetes Mother   . Heart attack Mother 40  . Diabetes Father   . Heart attack Father 55  . Diabetes Sister   . Arthritis Brother     Medications- reviewed and updated Current Outpatient Medications  Medication Sig Dispense Refill  . atorvastatin (LIPITOR) 20 MG tablet Take 1 tablet (20 mg total) by mouth daily. 90 tablet 3  . clopidogrel (PLAVIX) 75 MG tablet TAKE 1 TABLET BY MOUTH  DAILY 90 tablet 3  . Melatonin 3 MG TABS Take 3 mg by mouth at bedtime.    . nitroGLYCERIN (NITROSTAT) 0.4 MG  SL tablet Place 1 tablet (0.4 mg total) under the tongue every 5 (five) minutes as needed for chest pain. 25 tablet 2  . pantoprazole (PROTONIX) 20 MG tablet TAKE 1 TABLET BY MOUTH  EVERY MORNING 90 tablet 1  . polyvinyl alcohol (LIQUIFILM TEARS) 1.4 % ophthalmic solution Place 1 drop into both eyes daily as needed for dry eyes.    . sodium chloride (OCEAN) 0.65 % SOLN nasal spray Place 1 spray into both nostrils daily as needed for congestion.     No current  facility-administered medications for this visit.     Allergies-reviewed and updated Allergies  Allergen Reactions  . Sulfa Antibiotics Rash    rash    Social History   Social History Narrative   Married (brother patient here, wife patient elsewhere). 2 children (daughter in [place], son in [place] with [employer]).       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 Oropharynx normal Neck: no thyromegaly CV: RRR no murmurs rubs or gallops Lungs: CTAB no crackles, wheeze, rhonchi Abdomen: soft/nontender/nondistended/normal bowel sounds. No rebound or guarding.  Ext: no edema Skin: warm, dry Neuro: grossly normal, moves all extremities, PERRLA  Assessment/Plan:  72 y.o. male presenting for annual physical.  Health Maintenance counseling: 1. Anticipatory guidance: Patient counseled regarding regular dental exams -q6 months, eye exams -yearly, wearing seatbelts.  2. Risk factor reduction:  Advised patient of need for regular exercise and diet rich and fruits and vegetables to reduce risk of heart attack and stroke. Weight stable. Walk on treadmill every morning and walking in afternoon with wife weather permitting- most days. .  Wt Readings from Last 3 Encounters:  10/31/17 197 lb 9.6 oz (89.6 kg)  09/20/17 195 lb (88.5 kg)  11/04/16 198 lb 6.4 oz (90 kg)  3. Immunizations/screenings/ancillary studies- declines shingrix but discussed pharmacy option  Immunization History  Administered Date(s) Administered  . Influenza Split 05/30/2011, 05/15/2012  . Influenza  Whole 03/26/2008, 04/22/2010  . Influenza, High Dose Seasonal PF 04/26/2013, 04/15/2014, 04/28/2016  . Influenza,inj,Quad PF,6+ Mos 03/17/2015  . Influenza-Unspecified 04/13/2017  . Pneumococcal Conjugate-13 04/15/2014  . Pneumococcal Polysaccharide-23 04/22/2010  . Td 07/12/1995, 03/26/2008  . Tdap 10/04/2016  . Zoster 03/26/2008  4. Prostate cancer screening-  follows with urology. Sees them may 1st.  Lab Results  Component Value Date   PSA undetectable 10/25/2016   PSA 1.61 10/18/2016   PSA 0.01 10/22/2014   5. Colon cancer screening - 09/26/17 at [facility]. Was told 10 year repeat 6. Skin cancer screening- [facility] derm Dr. Jimmye Norman every few years- saw 2 months ago- had a few spots removed but luckily benign. advised regular sunscreen use. Denies worrisome, changing, or new skin lesions.   Status of chronic or acute concerns   CAD- follows with cardiology Dr. Angelena Form. Compliant with plavix, atorvastatin. recently taken off asa. Refilled nitroglycerin but never uses.   Insmonia- melatonin helps at 3 mg  Also check b12 with long term PPI use  Hyperlipidemia he is on 20mg   atorvastatin. Update LDL- hopeful under 70. Leave dose the same unless LDL above 80.   GERD (gastroesophageal reflux disease) on protonix and well controlled at 20mg . Had been on 40mg  a few years ago. We discussed zantac option- he is pleased where he is at (despite potential risk reduction) and BID dosing would be hard  PMR (polymyalgia rheumatica) (HCC) PMR- diagnosed Dr. Dossie Der- methotrexate only last year- now he is completley off that and has been off prednisone for well over a year now. Still checks in every 6 months. No recurrence of headaches  Return in about 1 year (around 11/01/2018) for physical.  Lab/Order associations: High risk medication use - Plan: Vitamin B12  Hyperlipidemia, unspecified hyperlipidemia type - Plan: CBC, Comprehensive metabolic panel, Lipid panel  Preventative health  care - Plan: CBC, Comprehensive metabolic panel, Lipid panel, Vitamin B12  Meds ordered this encounter  Medications  . nitroGLYCERIN (NITROSTAT) 0.4 MG SL tablet    Sig: Place 1 tablet (0.4 mg total) under the tongue every 5 (five) minutes as needed for chest pain.    Dispense:  25 tablet    Refill:  2   Return precautions advised.  Garret Reddish, MD

## 2017-10-31 NOTE — Patient Instructions (Addendum)
Get shingrix at your pharmacy if you can find it and let us know  Refilled nitroglycerin  Please stop by lab before you go You are fasting correct? If not we can order as future fasting labs for you and schedule an appointment.

## 2017-10-31 NOTE — Assessment & Plan Note (Signed)
on protonix and well controlled at 20mg . Had been on 40mg  a few years ago. We discussed zantac option- he is pleased where he is at (despite potential risk reduction) and BID dosing would be hard

## 2017-11-02 ENCOUNTER — Other Ambulatory Visit: Payer: Self-pay | Admitting: Family Medicine

## 2017-11-03 ENCOUNTER — Ambulatory Visit (INDEPENDENT_AMBULATORY_CARE_PROVIDER_SITE_OTHER): Payer: Commercial Managed Care - PPO

## 2017-11-03 DIAGNOSIS — E538 Deficiency of other specified B group vitamins: Secondary | ICD-10-CM | POA: Diagnosis not present

## 2017-11-03 MED ORDER — CYANOCOBALAMIN 1000 MCG/ML IJ SOLN
1000.0000 ug | Freq: Once | INTRAMUSCULAR | Status: AC
  Administered 2017-11-03: 1000 ug via INTRAMUSCULAR

## 2017-11-03 NOTE — Progress Notes (Signed)
Patient received vitamin B12 1000 mcg IM in left deltoid.  Tolerated without difficulty.  Already scheduled for second B12 injection in 1 week.

## 2017-11-08 DIAGNOSIS — Z8546 Personal history of malignant neoplasm of prostate: Secondary | ICD-10-CM | POA: Diagnosis not present

## 2017-11-08 LAB — PSA: PSA: 0.015

## 2017-11-10 ENCOUNTER — Ambulatory Visit (INDEPENDENT_AMBULATORY_CARE_PROVIDER_SITE_OTHER): Payer: Commercial Managed Care - PPO

## 2017-11-10 DIAGNOSIS — E538 Deficiency of other specified B group vitamins: Secondary | ICD-10-CM | POA: Diagnosis not present

## 2017-11-10 MED ORDER — CYANOCOBALAMIN 1000 MCG/ML IJ SOLN
1000.0000 ug | Freq: Once | INTRAMUSCULAR | Status: AC
  Administered 2017-11-10: 1000 ug via INTRAMUSCULAR

## 2017-11-10 NOTE — Progress Notes (Signed)
Cyanocobalamin 1000 mcg/mL, 1 mL given IM, right deltoid, mfg: [employer], lot#: [id number], exp: [id number], ndc:[id number], pt tolerated well

## 2017-11-14 ENCOUNTER — Encounter: Payer: Self-pay | Admitting: Family Medicine

## 2017-11-17 ENCOUNTER — Ambulatory Visit (INDEPENDENT_AMBULATORY_CARE_PROVIDER_SITE_OTHER): Payer: Commercial Managed Care - PPO | Admitting: *Deleted

## 2017-11-17 DIAGNOSIS — E538 Deficiency of other specified B group vitamins: Secondary | ICD-10-CM | POA: Diagnosis not present

## 2017-11-17 MED ORDER — CYANOCOBALAMIN 1000 MCG/ML IJ SOLN
1000.0000 ug | Freq: Once | INTRAMUSCULAR | Status: AC
  Administered 2017-11-17: 1000 ug via INTRAMUSCULAR

## 2017-11-17 NOTE — Progress Notes (Signed)
Per orders of Dr. Yong Channel injection of Cyanocobalamin 1000 mcg given in Left Deltoid by Anselmo Pickler, LPN Patient tolerated injection well. Pt told to return in one week for next injection. Pt verbalized understanding.

## 2017-11-24 ENCOUNTER — Ambulatory Visit (INDEPENDENT_AMBULATORY_CARE_PROVIDER_SITE_OTHER): Payer: Commercial Managed Care - PPO

## 2017-11-24 DIAGNOSIS — E538 Deficiency of other specified B group vitamins: Secondary | ICD-10-CM

## 2017-11-24 MED ORDER — CYANOCOBALAMIN 1000 MCG/ML IJ SOLN
1000.0000 ug | Freq: Once | INTRAMUSCULAR | Status: AC
Start: 2017-11-24 — End: 2017-11-24
  Administered 2017-11-24: 1000 ug via INTRAMUSCULAR

## 2017-11-24 NOTE — Progress Notes (Signed)
Per orders of Dr. Yong Channel, injection of b-12 given by Francella Solian into right deltoid.  Patient tolerated injection well.

## 2017-11-27 DIAGNOSIS — M25561 Pain in right knee: Secondary | ICD-10-CM | POA: Diagnosis not present

## 2017-12-22 ENCOUNTER — Telehealth: Payer: Self-pay

## 2017-12-22 ENCOUNTER — Other Ambulatory Visit: Payer: Self-pay

## 2017-12-22 DIAGNOSIS — E538 Deficiency of other specified B group vitamins: Secondary | ICD-10-CM

## 2017-12-22 NOTE — Telephone Encounter (Signed)
Pt coming for labs 12/25/17. Note says B12 check. Please place future order. Thank you.

## 2017-12-22 NOTE — Telephone Encounter (Signed)
Order placed as requested.

## 2017-12-25 ENCOUNTER — Encounter: Payer: Self-pay | Admitting: Family Medicine

## 2017-12-25 ENCOUNTER — Other Ambulatory Visit (INDEPENDENT_AMBULATORY_CARE_PROVIDER_SITE_OTHER): Payer: Commercial Managed Care - PPO

## 2017-12-25 DIAGNOSIS — E538 Deficiency of other specified B group vitamins: Secondary | ICD-10-CM

## 2017-12-25 LAB — VITAMIN B12: VITAMIN B 12: 1068 pg/mL — AB (ref 211–911)

## 2017-12-25 NOTE — Progress Notes (Signed)
Please make sure patient is taking 1000 mcg of B12 daily by mouth.  His numbers look great.  Have him continue current medication if he is taking this.  Let me know if he is not

## 2018-01-10 DIAGNOSIS — M353 Polymyalgia rheumatica: Secondary | ICD-10-CM | POA: Diagnosis not present

## 2018-01-10 DIAGNOSIS — M1990 Unspecified osteoarthritis, unspecified site: Secondary | ICD-10-CM | POA: Diagnosis not present

## 2018-01-10 DIAGNOSIS — E559 Vitamin D deficiency, unspecified: Secondary | ICD-10-CM | POA: Diagnosis not present

## 2018-01-10 DIAGNOSIS — R739 Hyperglycemia, unspecified: Secondary | ICD-10-CM | POA: Diagnosis not present

## 2018-03-20 DIAGNOSIS — L821 Other seborrheic keratosis: Secondary | ICD-10-CM | POA: Diagnosis not present

## 2018-03-20 DIAGNOSIS — D1801 Hemangioma of skin and subcutaneous tissue: Secondary | ICD-10-CM | POA: Diagnosis not present

## 2018-03-26 ENCOUNTER — Encounter: Payer: Self-pay | Admitting: Family Medicine

## 2018-03-27 ENCOUNTER — Ambulatory Visit (INDEPENDENT_AMBULATORY_CARE_PROVIDER_SITE_OTHER): Payer: Commercial Managed Care - PPO

## 2018-03-27 DIAGNOSIS — Z23 Encounter for immunization: Secondary | ICD-10-CM

## 2018-03-27 NOTE — Patient Instructions (Signed)
Health Maintenance Due  Topic Date Due  . INFLUENZA VACCINE  02/08/2018    Depression screen [facility] 2/9 11/04/2016 11/04/2016 10/27/2015  Decreased Interest 0 0 0  Down, Depressed, Hopeless 0 0 0  PHQ - 2 Score 0 0 0

## 2018-03-27 NOTE — Progress Notes (Signed)
Patient in today for Flu shot. Administered in right arm. VIS given. Patient tolerated well.

## 2018-03-31 ENCOUNTER — Other Ambulatory Visit: Payer: Self-pay | Admitting: Family Medicine

## 2018-08-06 ENCOUNTER — Encounter: Payer: Self-pay | Admitting: Family Medicine

## 2018-09-23 ENCOUNTER — Other Ambulatory Visit: Payer: Self-pay | Admitting: Cardiovascular Disease

## 2018-09-23 ENCOUNTER — Other Ambulatory Visit: Payer: Self-pay | Admitting: Family Medicine

## 2018-10-29 ENCOUNTER — Ambulatory Visit: Payer: Commercial Managed Care - PPO | Admitting: Cardiovascular Disease

## 2018-10-29 ENCOUNTER — Encounter: Payer: Self-pay | Admitting: Family Medicine

## 2018-10-29 NOTE — Telephone Encounter (Signed)
Please see message from patient

## 2018-11-01 DIAGNOSIS — M1990 Unspecified osteoarthritis, unspecified site: Secondary | ICD-10-CM | POA: Diagnosis not present

## 2018-11-01 DIAGNOSIS — E559 Vitamin D deficiency, unspecified: Secondary | ICD-10-CM | POA: Diagnosis not present

## 2018-11-01 DIAGNOSIS — H538 Other visual disturbances: Secondary | ICD-10-CM | POA: Diagnosis not present

## 2018-11-01 DIAGNOSIS — M353 Polymyalgia rheumatica: Secondary | ICD-10-CM | POA: Diagnosis not present

## 2018-11-01 DIAGNOSIS — R739 Hyperglycemia, unspecified: Secondary | ICD-10-CM | POA: Diagnosis not present

## 2018-11-01 DIAGNOSIS — E785 Hyperlipidemia, unspecified: Secondary | ICD-10-CM | POA: Diagnosis not present

## 2018-11-01 DIAGNOSIS — I2510 Atherosclerotic heart disease of native coronary artery without angina pectoris: Secondary | ICD-10-CM | POA: Diagnosis not present

## 2018-11-02 ENCOUNTER — Encounter: Payer: Commercial Managed Care - PPO | Admitting: Family Medicine

## 2018-11-19 ENCOUNTER — Encounter: Payer: Self-pay | Admitting: Family Medicine

## 2018-11-21 DIAGNOSIS — N5201 Erectile dysfunction due to arterial insufficiency: Secondary | ICD-10-CM | POA: Diagnosis not present

## 2018-11-21 DIAGNOSIS — Z8546 Personal history of malignant neoplasm of prostate: Secondary | ICD-10-CM | POA: Diagnosis not present

## 2018-11-21 LAB — PSA

## 2018-11-29 ENCOUNTER — Encounter: Payer: Self-pay | Admitting: Family Medicine

## 2018-12-08 ENCOUNTER — Other Ambulatory Visit: Payer: Self-pay | Admitting: Family Medicine

## 2018-12-30 ENCOUNTER — Encounter: Payer: Self-pay | Admitting: Family Medicine

## 2018-12-31 DIAGNOSIS — H26493 Other secondary cataract, bilateral: Secondary | ICD-10-CM | POA: Diagnosis not present

## 2018-12-31 DIAGNOSIS — H26491 Other secondary cataract, right eye: Secondary | ICD-10-CM | POA: Diagnosis not present

## 2019-01-02 ENCOUNTER — Encounter: Payer: Self-pay | Admitting: Family Medicine

## 2019-01-02 ENCOUNTER — Telehealth: Payer: Self-pay | Admitting: Family Medicine

## 2019-01-02 NOTE — Telephone Encounter (Signed)
Thanks so much Lea-I also left him a [employer]

## 2019-01-02 NOTE — Telephone Encounter (Signed)
I called to follow up on patient message. He states that he has no other issues except left arm pain. No SOB or chest pain. He was unable to schedule for today. I scheduled patient an appointment for tomorrow with Dr. Elease Hashimoto. I also explained that if he has any worsening symptoms to go to the emergency room. He stated that he appreciated the call and would be ok to see Dr. Elease Hashimoto tomorrow. He verbalized understanding that he would go the ER if symptoms worsened.

## 2019-01-03 ENCOUNTER — Ambulatory Visit (INDEPENDENT_AMBULATORY_CARE_PROVIDER_SITE_OTHER): Payer: Commercial Managed Care - PPO | Admitting: Family Medicine

## 2019-01-03 ENCOUNTER — Encounter: Payer: Self-pay | Admitting: Family Medicine

## 2019-01-03 ENCOUNTER — Other Ambulatory Visit: Payer: Self-pay

## 2019-01-03 VITALS — BP 150/70 | HR 77 | Temp 98.1°F | Wt 202.8 lb

## 2019-01-03 DIAGNOSIS — M79632 Pain in left forearm: Secondary | ICD-10-CM

## 2019-01-03 MED ORDER — PREDNISONE 10 MG PO TABS: ORAL_TABLET | ORAL | 0 refills | Status: DC

## 2019-01-03 NOTE — Progress Notes (Signed)
Subjective:     Patient ID: Richard Mahoney, male   DOB: [birth year], 73 y.o.   MRN: [id number]  HPI  Patient is seen with left upper extremity pain.  Onset about 2 weeks ago.  No injury.  He describes a "throbbing "pain which comes and goes which radiates basically from the elbow down toward the hand.  He has involvement of the dorsum of the hand as well as the volar aspect and includes also the ulnar aspect of the hand.  He thinks he may have had some intermittent numbness involving much of the hand but not in the arm.  No neck pain.  He does not have any pain in the shoulder or arm region.  He has not noted any temperature changes or color changes of hand/forearm.    He has taken Advil and Aleve without relief.  He had rheumatologist with [facility] that recommend he take prednisone 40 mg for 1 day last week and his symptoms were fully resolved for about 24 hours after taking the prednisone.  They then promptly recurred.  Is right-hand dominant.  He has not had any chest pain.  He walks up to 4 miles per day and states he has had no arm pain with walking or activity.  No dyspnea.  Arm pain has actually been better with walking.    Past Medical History:  Diagnosis Date  . Coronary artery disease    a. ETT 9/13: + ischemic ECG changes;  b. ETT-MV 9/13: EF 62%;  + ischemic EKG changes and + inf ischemia;  c. LHC 9/13: pLAD 30%, mLAD 70-80%, oDx 40-50% ==> FFR hemodynamically significant stenosis in mLAD ==> PCI: Xience Xpedition DES to mLAD; Cath 06/01/12   single vessel CAD w/ patent mid LAD stent and normal LV systolic function  . Fibromyalgia    muscle aches in past in both forearms, before statin even  . GERD (gastroesophageal reflux disease)   . Hyperlipidemia LDL goal < 70   . Nephrolithiasis   . Testosterone deficiency 04/28/2008   History treatment around 2009. No current treatment.      Past Surgical History:  Procedure Laterality Date  . CARDIAC CATHETERIZATION  04/06/2012;  06/01/2012  . CATARACT EXTRACTION W/ INTRAOCULAR LENS IMPLANT  08/2011   right .Left 12/2014.   Marland Kitchen CORONARY ANGIOPLASTY WITH STENT PLACEMENT  04/11/2012   DES LAD  . CYSTOSCOPY W/ STONE MANIPULATION  2009  . INGUINAL HERNIA REPAIR  1990's   right  . KNEE ARTHROSCOPY  1990's   right  . LEFT HEART CATHETERIZATION WITH CORONARY ANGIOGRAM N/A 06/01/2012   Procedure: LEFT HEART CATHETERIZATION WITH CORONARY ANGIOGRAM;  Surgeon: Burnell Blanks, MD;  Location: [facility] CATH LAB;  Service: Cardiovascular;  Laterality: N/A;  . LYMPHADENECTOMY Bilateral 08/18/2014   Procedure: LYMPHADENECTOMY;  Surgeon: Raynelle Bring, MD;  Location: [facility];  Service: Urology;  Laterality: Bilateral;  . PERCUTANEOUS CORONARY STENT INTERVENTION (PCI-S) N/A 04/11/2012   Procedure: PERCUTANEOUS CORONARY STENT INTERVENTION (PCI-S);  Surgeon: Burnell Blanks, MD;  Location: [facility] CATH LAB;  Service: Cardiovascular;  Laterality: N/A;  . ROBOT ASSISTED LAPAROSCOPIC RADICAL PROSTATECTOMY N/A 08/18/2014   Procedure: ROBOTIC ASSISTED LAPAROSCOPIC RADICAL PROSTATECTOMY LEVEL 2;  Surgeon: Raynelle Bring, MD;  Location: [facility] ORS;  Service: Urology;  Laterality: N/A;    reports that he has never smoked. He has never used smokeless tobacco. He reports that he does not drink alcohol or use drugs. family history includes Arthritis in his brother; Diabetes in his father,  mother, and sister; Heart attack (age of onset: 33) in his father; Heart attack (age of onset: 24) in his mother. Allergies  Allergen Reactions  . Sulfa Antibiotics Rash    rash      Review of Systems  Constitutional: Negative for chills and fever.  Respiratory: Negative for cough, shortness of breath and wheezing.   Cardiovascular: Negative for chest pain, palpitations and leg swelling.  Musculoskeletal: Negative for neck pain.  Skin: Negative for rash.  Neurological: Negative for weakness and headaches.  Hematological: Negative for adenopathy.        Objective:   Physical Exam Constitutional:      Appearance: Normal appearance.  Neck:     Musculoskeletal: Neck supple.  Cardiovascular:     Rate and Rhythm: Normal rate and regular rhythm.     Comments: Good capillary refill both hands.  He has good radial and ulnar pulses Pulmonary:     Effort: Pulmonary effort is normal.     Breath sounds: Normal breath sounds.  Musculoskeletal:        General: No swelling.     Comments: No edema upper extremities.  No warmth.  No erythema.  Full range of motion all joints.  He has some mild tenderness in palpating around the wrist area but no visible swelling or ecchymosis.  Neurological:     Mental Status: He is alert.     Comments: Deep tendon reflexes are 2+ throughout upper extremities.  Full strength throughout.  Normal sensory function to touch        Assessment:     Left forearm pain.  No evidence of any circulatory issues.  No visible edema.  No infectious issues.  Distribution does not likely reflect carpal tunnel as he has some ulnar sided pain and dorsal hand pain.  Questionable cervical nerve root impingement-though denies any neck pain currently    Plan:     -Recommend trial of prednisone taper starting at 40 mg daily and treat for 8 days -Follow-up with primary if he has either no relief with prednisone or breakthrough pain after stopping the prednisone. -Consider cervical imaging if pain persists  Eulas Post MD [facility]

## 2019-01-03 NOTE — Patient Instructions (Signed)
Please let me or Dr Yong Channel know if pain not better in one week with the prednisone.  Follow up sooner for any weakness or progressive pain.

## 2019-01-04 ENCOUNTER — Encounter: Payer: Self-pay | Admitting: Family Medicine

## 2019-01-07 ENCOUNTER — Ambulatory Visit: Payer: Commercial Managed Care - PPO | Admitting: Family Medicine

## 2019-01-11 ENCOUNTER — Encounter: Payer: Self-pay | Admitting: Family Medicine

## 2019-01-16 NOTE — Progress Notes (Signed)
Phone: [phone]   Subjective:  Patient presents today for their annual physical. Chief complaint-noted.   See problem oriented charting- ROS- full  review of systems was completed and negative except for: left arm/hand pain which has resolved now  The following were reviewed and entered/updated in epic: Past Medical History:  Diagnosis Date  . Coronary artery disease    a. ETT 9/13: + ischemic ECG changes;  b. ETT-MV 9/13: EF 62%;  + ischemic EKG changes and + inf ischemia;  c. LHC 9/13: pLAD 30%, mLAD 70-80%, oDx 40-50% ==> FFR hemodynamically significant stenosis in mLAD ==> PCI: Xience Xpedition DES to mLAD; Cath 06/01/12   single vessel CAD w/ patent mid LAD stent and normal LV systolic function  . Fibromyalgia    muscle aches in past in both forearms, before statin even  . GERD (gastroesophageal reflux disease)   . Hyperlipidemia LDL goal < 70   . Nephrolithiasis   . Testosterone deficiency 04/28/2008   History treatment around 2009. No current treatment.      Patient Active Problem List   Diagnosis Date Noted  . PMR (polymyalgia rheumatica) (HCC) 11/20/2015    Priority: High  . History of prostate cancer 08/18/2014    Priority: High  . Coronary atherosclerosis of native coronary artery 04/12/2012    Priority: High  . Vitamin B12 deficiency 10/31/2017    Priority: Medium  . GERD (gastroesophageal reflux disease) 03/17/2015    Priority: Medium  . Bilateral occipital neuralgia 01/22/2014    Priority: Medium  . Hyperlipidemia 04/25/2012    Priority: Medium  . Insomnia 04/22/2010    Priority: Low  . NEPHROLITHIASIS, HX OF 03/26/2008    Priority: Low   Past Surgical History:  Procedure Laterality Date  . CARDIAC CATHETERIZATION  04/06/2012; 06/01/2012  . CATARACT EXTRACTION W/ INTRAOCULAR LENS IMPLANT  08/2011   right .Left 12/2014.   Marland Kitchen CORONARY ANGIOPLASTY WITH STENT PLACEMENT  04/11/2012   DES LAD  . CYSTOSCOPY W/ STONE MANIPULATION  2009  . INGUINAL HERNIA  REPAIR  1990's   right  . KNEE ARTHROSCOPY  1990's   right  . LEFT HEART CATHETERIZATION WITH CORONARY ANGIOGRAM N/A 06/01/2012   Procedure: LEFT HEART CATHETERIZATION WITH CORONARY ANGIOGRAM;  Surgeon: Burnell Blanks, MD;  Location: [facility] CATH LAB;  Service: Cardiovascular;  Laterality: N/A;  . LYMPHADENECTOMY Bilateral 08/18/2014   Procedure: LYMPHADENECTOMY;  Surgeon: Raynelle Bring, MD;  Location: [facility] ORS;  Service: Urology;  Laterality: Bilateral;  . PERCUTANEOUS CORONARY STENT INTERVENTION (PCI-S) N/A 04/11/2012   Procedure: PERCUTANEOUS CORONARY STENT INTERVENTION (PCI-S);  Surgeon: Burnell Blanks, MD;  Location: [facility] CATH LAB;  Service: Cardiovascular;  Laterality: N/A;  . ROBOT ASSISTED LAPAROSCOPIC RADICAL PROSTATECTOMY N/A 08/18/2014   Procedure: ROBOTIC ASSISTED LAPAROSCOPIC RADICAL PROSTATECTOMY LEVEL 2;  Surgeon: Raynelle Bring, MD;  Location: [facility] ORS;  Service: Urology;  Laterality: N/A;    Family History  Problem Relation Age of Onset  . Diabetes Mother   . Heart attack Mother 5  . Diabetes Father   . Heart attack Father 98  . Diabetes Sister   . Arthritis Brother     Medications- reviewed and updated Current Outpatient Medications  Medication Sig Dispense Refill  . atorvastatin (LIPITOR) 20 MG tablet TAKE 1 TABLET BY MOUTH  DAILY (Patient taking differently: At night) 90 tablet 3  . clopidogrel (PLAVIX) 75 MG tablet TAKE 1 TABLET BY MOUTH  DAILY (Patient taking differently: Pt taking 1 tablet in the morning) 90 tablet 1  .  cyanocobalamin 1000 MCG tablet Take 1,000 mcg by mouth daily.    . Melatonin 3 MG TABS Take 5 mg by mouth at bedtime. Patient taking 5 mg at bedtime    . nitroGLYCERIN (NITROSTAT) 0.4 MG SL tablet Place 1 tablet (0.4 mg total) under the tongue every 5 (five) minutes as needed for chest pain. 25 tablet 2  . pantoprazole (PROTONIX) 20 MG tablet TAKE 1 TABLET BY MOUTH  EVERY MORNING 90 tablet 1  . polyvinyl alcohol (LIQUIFILM TEARS) 1.4 %  ophthalmic solution Place 1 drop into both eyes daily as needed for dry eyes.    . sildenafil (VIAGRA) 100 MG tablet     . sodium chloride (OCEAN) 0.65 % SOLN nasal spray Place 1 spray into both nostrils daily as needed for congestion.     No current facility-administered medications for this visit.     Allergies-reviewed and updated Allergies  Allergen Reactions  . Sulfa Antibiotics Rash    rash    Social History   Social History Narrative   Married (brother patient here, wife patient elsewhere). 2 children (daughter in [place], son in [place] with ticket[employer]).       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 Oropharynx normal Neck: no thyromegaly or cervical lymphadenopathy. No carotid bruits CV: RRR no murmurs rubs or gallops Lungs: CTAB no crackles, wheeze, rhonchi Abdomen: soft/nontender/nondistended/normal bowel sounds. No rebound or guarding.  Ext: no edema, 2+ Pt pulses Skin: warm, dry Neuro: grossly normal, moves all extremities, PERRLA    Assessment and Plan  73 y.o. male presenting for annual physical.  Health Maintenance counseling: 1. Anticipatory guidance: Patient counseled regarding regular dental exams -q6 months with recent implant- his 1st, eye exams -yearly,  avoiding smoking and second hand smoke , limiting alcohol to 2 beverages per day - well under that.   2. Risk factor reduction:  Advised patient of need for regular exercise and diet rich and fruits and vegetables to reduce risk of heart attack and stroke. Exercise- walking 2 miles daily. Diet-reasonably healthy.  Weight largely stable over the last year- he has turned  it around last few weeks after noting 202 on scales with Dr. Lauralee Evener Readings from Last 3 Encounters:  01/17/19 198 lb (89.8 kg)  01/03/19 202 lb 12.8 oz (92 kg)  10/31/17 197 lb 9.6 oz (89.6 kg)  3. Immunizations/screenings/ancillary studies-discussed Shingrix at pharmacy but also overlap of COVID-19 symptoms and side effects of Shingrix-he defers for now  Immunization History  Administered Date(s) Administered  . Influenza Split 05/30/2011, 05/15/2012  . Influenza Whole 03/26/2008, 04/22/2010  . Influenza, High Dose Seasonal PF 04/26/2013, 04/15/2014, 04/28/2016, 03/27/2018  . Influenza,inj,Quad PF,6+ Mos 03/17/2015  . Influenza-Unspecified 04/13/2017  . Pneumococcal Conjugate-13 04/15/2014  . Pneumococcal Polysaccharide-23 04/22/2010  . Td 07/12/1995, 03/26/2008  . Tdap 10/04/2016  . Zoster 03/26/2008  4. Prostate cancer follow-up- follows regularly with urology due to prostate cancer history. Doing really well after prostatectomy Lab Results  Component Value Date   PSA 0.015ng 11/26/2018   PSA 0.015 11/08/2017   PSA undetectable 10/25/2016  5. Colon cancer screening - September 26, 2017 at [facility] told no further follow up 6. Skin cancer screening-[facility] Dr. Jimmye Norman every few years. advised regular sunscreen use. Denies worrisome, changing, or new skin lesions.  7.  Never smoker  Status of chronic or acute concerns  Hyperlipidemia/CAD - Taking Atorvastatin 20 mg daily.  Also compliant with Plavix-remains off aspirin.  Has never had to use nitroglycerin.  Follows with Dr. Angelena Form.  Target LDL under 70   GERD - Taking Pantoprazole 20 mg daily-prefers this over Pepcid twice daily trial despite possible risks.  Likely contributed to low B12 levels- wants to stick with current meds  Insomnia -melatonin at 5 mg is helpful  B12 deficiency- remains on over-the-counter B12.  Update B12 level today . Also taking vitamin D and feels like it gives him some  energy- 10k units per day.  since on high dose will check vitamin D.   Polymyalgia rheumatica history- remains off prednisone and methotrexate for well over a year. Did mention left arm issues to Dr. Dossie Der and she did not think related  Patient had lingering issues with left arm/hand pain after prednisone but now has fully resolved. May have had irritated nerve root in neck.   1 year physical Future Appointments  Date Time Provider [place]  03/07/2019  9:20 AM Burnell Blanks, MD [facility]   Lab/Order associations:fasting   ICD-10-CM   1. Preventative health care  Z00.00 VITAMIN D 25 Hydroxy (Vit-D Deficiency, Fractures)  2. Atherosclerosis of native coronary artery of native heart without angina pectoris  I25.10   3. Vitamin B12 deficiency  E53.8 Vitamin B12  4. Hyperlipidemia, unspecified hyperlipidemia type  E78.5 CBC    Comprehensive metabolic panel    Lipid panel  5. PMR (polymyalgia rheumatica) (HCC)  M35.3   6. Overweight  E66.3   7. High risk medication use  Z79.899 VITAMIN D 25 Hydroxy (Vit-D Deficiency, Fractures)   Return precautions advised.  Garret Reddish, MD

## 2019-01-17 ENCOUNTER — Ambulatory Visit (INDEPENDENT_AMBULATORY_CARE_PROVIDER_SITE_OTHER): Payer: Commercial Managed Care - PPO | Admitting: Family Medicine

## 2019-01-17 ENCOUNTER — Encounter: Payer: Self-pay | Admitting: Family Medicine

## 2019-01-17 ENCOUNTER — Other Ambulatory Visit: Payer: Self-pay

## 2019-01-17 VITALS — BP 130/80 | HR 52 | Temp 97.7°F | Ht 71.75 in | Wt 198.0 lb

## 2019-01-17 DIAGNOSIS — E785 Hyperlipidemia, unspecified: Secondary | ICD-10-CM | POA: Diagnosis not present

## 2019-01-17 DIAGNOSIS — E538 Deficiency of other specified B group vitamins: Secondary | ICD-10-CM

## 2019-01-17 DIAGNOSIS — E663 Overweight: Secondary | ICD-10-CM

## 2019-01-17 DIAGNOSIS — Z0000 Encounter for general adult medical examination without abnormal findings: Secondary | ICD-10-CM | POA: Diagnosis not present

## 2019-01-17 DIAGNOSIS — Z79899 Other long term (current) drug therapy: Secondary | ICD-10-CM | POA: Diagnosis not present

## 2019-01-17 DIAGNOSIS — I2510 Atherosclerotic heart disease of native coronary artery without angina pectoris: Secondary | ICD-10-CM | POA: Diagnosis not present

## 2019-01-17 DIAGNOSIS — M353 Polymyalgia rheumatica: Secondary | ICD-10-CM

## 2019-01-17 LAB — CBC
HCT: 43.6 % (ref 39.0–52.0)
Hemoglobin: 14.6 g/dL (ref 13.0–17.0)
MCHC: 33.6 g/dL (ref 30.0–36.0)
MCV: 92.9 fl (ref 78.0–100.0)
Platelets: 201 10*3/uL (ref 150.0–400.0)
RBC: 4.69 Mil/uL (ref 4.22–5.81)
RDW: 12.8 % (ref 11.5–15.5)
WBC: 4.3 10*3/uL (ref 4.0–10.5)

## 2019-01-17 LAB — COMPREHENSIVE METABOLIC PANEL
ALT: 19 U/L (ref 0–53)
AST: 16 U/L (ref 0–37)
Albumin: 4.3 g/dL (ref 3.5–5.2)
Alkaline Phosphatase: 58 U/L (ref 39–117)
BUN: 24 mg/dL — ABNORMAL HIGH (ref 6–23)
CO2: 29 mEq/L (ref 19–32)
Calcium: 9.1 mg/dL (ref 8.4–10.5)
Chloride: 104 mEq/L (ref 96–112)
Creatinine, Ser: 1.15 mg/dL (ref 0.40–1.50)
GFR: 61.93 mL/min (ref >60.00)
Glucose, Bld: 102 mg/dL — ABNORMAL HIGH (ref 70–99)
Potassium: 4.5 mEq/L (ref 3.5–5.1)
Sodium: 141 mEq/L (ref 135–145)
Total Bilirubin: 0.9 mg/dL (ref 0.2–1.2)
Total Protein: 6.7 g/dL (ref 6.0–8.3)

## 2019-01-17 LAB — LIPID PANEL
Cholesterol: 155 mg/dL (ref 0–200)
HDL: 49.9 mg/dL (ref >39.00)
LDL Cholesterol: 78 mg/dL (ref 0–99)
NonHDL: 105.31
Total CHOL/HDL Ratio: 3
Triglycerides: 138 mg/dL (ref 0.0–149.0)
VLDL: 27.6 mg/dL (ref 0.0–40.0)

## 2019-01-17 LAB — VITAMIN D 25 HYDROXY (VIT D DEFICIENCY, FRACTURES): VITD: 38.69 ng/mL (ref 30.00–100.00)

## 2019-01-17 LAB — VITAMIN B12: Vitamin B-12: 1220 pg/mL — ABNORMAL HIGH (ref 211–911)

## 2019-01-17 MED ORDER — PANTOPRAZOLE SODIUM 20 MG PO TBEC: 20.0000 mg | DELAYED_RELEASE_TABLET | Freq: Every morning | ORAL | 3 refills | Status: DC

## 2019-01-17 NOTE — Progress Notes (Signed)
Phone: [phone]    Subjective:   Patient presents today for their annual wellness visit.    Preventive Screening-Counseling & Management  Smoking Status: Never Smoker Second Hand Smoking status: No smokers in home Limits alcohol well under 14 per week  Risk Factors Regular exercise: walks 2 miles per day Diet: reasonably healthy- has trimmed a few lbs after weight got over 200  Fall Risk: None  Fall Risk  01/17/2019 11/04/2016 11/04/2016 10/27/2015 04/15/2014  Falls in the past year? 0 No No No No  Opioid use history:  no long term opioids use  Cardiac risk factors:  Known CAD and follows with cardiology advanced age (older than 14 for men, 98 for women)  treated Hyperlipidemia  No  Hypertension  No diabetes.  Family History: mom and dad with CAD   Depression Screen None. PHQ2 0  Depression screen [facility] 2/9 01/17/2019 01/17/2019 11/04/2016 11/04/2016 10/27/2015  Decreased Interest 0 0 0 0 0  Down, Depressed, Hopeless 0 0 0 0 0  PHQ - 2 Score 0 0 0 0 0   Activities of Daily Living Independent ADLs and IADLs   Hearing Difficulties: -patient declines  Cognitive Testing             No reported trouble.   Normal 3 word recall and clock draw with mini cog test  List the Names of Other Physician/Practitioners you currently use: -Dr. Angelena Form with cardiology -Dr. Loyal Jacobson with [facility] - Dr. Ovid Curd optho  Immunization History  Administered Date(s) Administered  . Influenza Split 05/30/2011, 05/15/2012  . Influenza Whole 03/26/2008, 04/22/2010  . Influenza, High Dose Seasonal PF 04/26/2013, 04/15/2014, 04/28/2016, 03/27/2018  . Influenza,inj,Quad PF,6+ Mos 03/17/2015  . Influenza-Unspecified 04/13/2017  . Pneumococcal Conjugate-13 04/15/2014  . Pneumococcal Polysaccharide-23 04/22/2010  . Td 07/12/1995, 03/26/2008  . Tdap 10/04/2016  . Zoster 03/26/2008   Required Immunizations needed today - defers shingrix Health Maintenance  Topic Date Due  .   Hepatitis C: One time screening is recommended by Center for Disease Control  (CDC) for  adults born from [birth year] through 1965.   04/12/2060*  . Flu Shot  02/09/2019  . Tetanus Vaccine  10/05/2026  . Colon Cancer Screening  09/27/2027  . Pneumonia vaccines  Completed  *Topic was postponed. The date shown is not the original due date.    Screening tests- up to date 1. Colon cancer screening had 2019 and no repeat needed 2. Prostate cancer follow up- with urology 3. Lung cancer screening not needed as never smoker  ROS- No pertinent positives discovered in course of AWV- see ROS for physical  The following were reviewed and entered/updated in epic: Past Medical History:  Diagnosis Date  . Coronary artery disease    a. ETT 9/13: + ischemic ECG changes;  b. ETT-MV 9/13: EF 62%;  + ischemic EKG changes and + inf ischemia;  c. LHC 9/13: pLAD 30%, mLAD 70-80%, oDx 40-50% ==> FFR hemodynamically significant stenosis in mLAD ==> PCI: Xience Xpedition DES to mLAD; Cath 06/01/12   single vessel CAD w/ patent mid LAD stent and normal LV systolic function  . Fibromyalgia    muscle aches in past in both forearms, before statin even  . GERD (gastroesophageal reflux disease)   . Hyperlipidemia LDL goal < 70   . Nephrolithiasis   . Testosterone deficiency 04/28/2008   History treatment around 2009. No current treatment.      Patient Active Problem List   Diagnosis Date Noted  . PMR (polymyalgia  rheumatica) ([place]) 11/20/2015    Priority: High  . History of prostate cancer 08/18/2014    Priority: High  . Coronary atherosclerosis of native coronary artery 04/12/2012    Priority: High  . Vitamin B12 deficiency 10/31/2017    Priority: Medium  . GERD (gastroesophageal reflux disease) 03/17/2015    Priority: Medium  . Bilateral occipital neuralgia 01/22/2014    Priority: Medium  . Hyperlipidemia 04/25/2012    Priority: Medium  . Insomnia 04/22/2010    Priority: Low  . NEPHROLITHIASIS, HX OF  03/26/2008    Priority: Low   Past Surgical History:  Procedure Laterality Date  . CARDIAC CATHETERIZATION  04/06/2012; 06/01/2012  . CATARACT EXTRACTION W/ INTRAOCULAR LENS IMPLANT  08/2011   right .Left 12/2014.   Marland Kitchen CORONARY ANGIOPLASTY WITH STENT PLACEMENT  04/11/2012   DES LAD  . CYSTOSCOPY W/ STONE MANIPULATION  2009  . INGUINAL HERNIA REPAIR  1990's   right  . KNEE ARTHROSCOPY  1990's   right  . LEFT HEART CATHETERIZATION WITH CORONARY ANGIOGRAM N/A 06/01/2012   Procedure: LEFT HEART CATHETERIZATION WITH CORONARY ANGIOGRAM;  Surgeon: Burnell Blanks, MD;  Location: [facility] CATH LAB;  Service: Cardiovascular;  Laterality: N/A;  . LYMPHADENECTOMY Bilateral 08/18/2014   Procedure: LYMPHADENECTOMY;  Surgeon: Raynelle Bring, MD;  Location: [facility];  Service: Urology;  Laterality: Bilateral;  . PERCUTANEOUS CORONARY STENT INTERVENTION (PCI-S) N/A 04/11/2012   Procedure: PERCUTANEOUS CORONARY STENT INTERVENTION (PCI-S);  Surgeon: Burnell Blanks, MD;  Location: [facility] CATH LAB;  Service: Cardiovascular;  Laterality: N/A;  . ROBOT ASSISTED LAPAROSCOPIC RADICAL PROSTATECTOMY N/A 08/18/2014   Procedure: ROBOTIC ASSISTED LAPAROSCOPIC RADICAL PROSTATECTOMY LEVEL 2;  Surgeon: Raynelle Bring, MD;  Location: [facility] ORS;  Service: Urology;  Laterality: N/A;    Family History  Problem Relation Age of Onset  . Diabetes Mother   . Heart attack Mother 40  . Diabetes Father   . Heart attack Father 39  . Diabetes Sister   . Arthritis Brother     Medications- reviewed and updated Current Outpatient Medications  Medication Sig Dispense Refill  . atorvastatin (LIPITOR) 20 MG tablet TAKE 1 TABLET BY MOUTH  DAILY (Patient taking differently: At night) 90 tablet 3  . clopidogrel (PLAVIX) 75 MG tablet TAKE 1 TABLET BY MOUTH  DAILY (Patient taking differently: Pt taking 1 tablet in the morning) 90 tablet 1  . cyanocobalamin 1000 MCG tablet Take 1,000 mcg by mouth daily.    . Melatonin 3 MG TABS Take 5 mg by  mouth at bedtime. Patient taking 5 mg at bedtime    . nitroGLYCERIN (NITROSTAT) 0.4 MG SL tablet Place 1 tablet (0.4 mg total) under the tongue every 5 (five) minutes as needed for chest pain. 25 tablet 2  . pantoprazole (PROTONIX) 20 MG tablet TAKE 1 TABLET BY MOUTH  EVERY MORNING 90 tablet 1  . polyvinyl alcohol (LIQUIFILM TEARS) 1.4 % ophthalmic solution Place 1 drop into both eyes daily as needed for dry eyes.    . sildenafil (VIAGRA) 100 MG tablet     . sodium chloride (OCEAN) 0.65 % SOLN nasal spray Place 1 spray into both nostrils daily as needed for congestion.     No current facility-administered medications for this visit.     Allergies-reviewed and updated Allergies  Allergen Reactions  . Sulfa Antibiotics Rash    rash    Social History   Socioeconomic History  . Marital status: Married    Spouse name: Not on file  . Number of  children: 2  . Years of education: Not on file  . Highest education level: Not on file  Occupational History  . Occupation: Retired  Scientific laboratory technician  . Financial resource strain: Not on file  . Food insecurity    Worry: Not on file    Inability: Not on file  . Transportation needs    Medical: Not on file    Non-medical: Not on file  Tobacco Use  . Smoking status: Never Smoker  . Smokeless tobacco: Never Used  Substance and Sexual Activity  . Alcohol use: No  . Drug use: No  . Sexual activity: Not Currently  Lifestyle  . Physical activity    Days per week: Not on file    Minutes per session: Not on file  . Stress: Not on file  Relationships  . Social Herbalist on phone: Not on file    Gets together: Not on file    Attends religious service: Not on file    Active member of club or organization: Not on file    Attends meetings of clubs or organizations: Not on file    Relationship status: Not on file  Other Topics Concern  . Not on file  Social History Narrative   Married (brother patient here, wife patient elsewhere). 2  children (daughter in [place], son in [place] with ticket[employer]).       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 Oropharynx normal Neck: no thyromegaly CV: RRR no murmurs rubs or gallops Lungs: CTAB no crackles, wheeze, rhonchi Abdomen: soft/nontender/nondistended/normal bowel sounds. No rebound or guarding.  Ext: no edema Skin: warm, dry Neuro: grossly normal, moves all extremities, PERRLA   Assessment/Plan:  AWV completed- discussed recommended screenings and documented any personalized health advice and referrals for preventive counseling. See AVS as well which was given to patient.   Future Appointments  Date Time Provider [place]  03/07/2019  9:20 AM Burnell Blanks, MD [facility]   1 year physical and AWV Lab/Order associations:   ICD-10-CM   1. Preventative health care  Z00.00 VITAMIN D 25 Hydroxy (Vit-D Deficiency, Fractures)  2. Atherosclerosis of native coronary artery of native heart without angina pectoris  I25.10   3. Vitamin B12 deficiency  E53.8 Vitamin B12  4. Hyperlipidemia, unspecified hyperlipidemia type  E78.5 CBC    Comprehensive metabolic panel    Lipid panel  5. PMR (polymyalgia rheumatica) (HCC)  M35.3   6. Overweight  E66.3   7. High risk medication use  Z79.899 VITAMIN D 25 Hydroxy (Vit-D Deficiency, Fractures)   Return precautions advised. Garret Reddish, MD

## 2019-01-17 NOTE — Patient Instructions (Addendum)
Please stop by lab before you go If you do not have mychart- we will call you about results within 5 business days of [place] receiving them.  If you have mychart- we will send your results within 3 business days of [place] receiving them.  If abnormal or we want to clarify a result, we will call or mychart you to make sure you receive the message.  If you have questions or concerns or don't hear within 5-7 days, please send [place] a message or call us.     Richard Mahoney , Thank you for taking time to come for your Medicare Wellness Visit. I appreciate your ongoing commitment to your health goals. Please review the following plan we discussed and let me know if I can assist you in the future.   These are the goals we discussed: 1. Continue regular walking 2. Maintain weight in the 190s 3. Physical in 1 year   This is a list of the screening recommended for you and due dates:  Health Maintenance  Topic Date Due  .  Hepatitis C: One time screening is recommended by Center for Disease Control  (CDC) for  adults born from [birth year] through 1967.   04/12/2060*  . Flu Shot  02/09/2019  . Tetanus Vaccine  10/05/2026  . Colon Cancer Screening  09/27/2027  . Pneumonia vaccines  Completed  *Topic was postponed. The date shown is not the original due date.

## 2019-01-18 ENCOUNTER — Other Ambulatory Visit: Payer: Self-pay | Admitting: *Deleted

## 2019-01-18 ENCOUNTER — Encounter: Payer: Self-pay | Admitting: Family Medicine

## 2019-01-18 MED ORDER — ATORVASTATIN CALCIUM 40 MG PO TABS: 40.0000 mg | ORAL_TABLET | Freq: Every day | ORAL | 3 refills | Status: DC

## 2019-02-25 ENCOUNTER — Encounter: Payer: Commercial Managed Care - PPO | Admitting: Family Medicine

## 2019-02-26 DIAGNOSIS — H26492 Other secondary cataract, left eye: Secondary | ICD-10-CM | POA: Diagnosis not present

## 2019-03-06 NOTE — Progress Notes (Signed)
Chief Complaint  Patient presents with  . Follow-up    CAD   History of Present Illness: 73 yo male with history of CAD, kidney stones, GERD here today for cardiac follow up. Initially seen in 2013 after abnormal stress test. Stress myoview 04/02/12 with ST depression at peak exercise and inferior wall defect concerning for ischemia. Cardiac cath on 04/06/12 with moderately severe mid LAD lesion involving the diagonal branch. He was brought back for PCI on 04/11/12 and FFR of the LAD was 0.77. A Xience Expedition (2.75 mm x 23 mm) drug eluting stent was placed in the mid LAD and angioplasty was performed on the ostium of the diagonal. Recurrent chest pain November 2013 with cardiac cath 06/01/12 demonstrating stable disease with patency of LAD stent. Stress test in February 2017 in [place] and he had no ischemia.   He is here today for follow up. The patient denies any chest pain, dyspnea, palpitations, lower extremity edema, orthopnea, PND, dizziness, near syncope or syncope.    Primary Care Physician: Marin Olp, MD  Past Medical History:  Diagnosis Date  . Coronary artery disease    a. ETT 9/13: + ischemic ECG changes;  b. ETT-MV 9/13: EF 62%;  + ischemic EKG changes and + inf ischemia;  c. LHC 9/13: pLAD 30%, mLAD 70-80%, oDx 40-50% ==> FFR hemodynamically significant stenosis in mLAD ==> PCI: Xience Xpedition DES to mLAD; Cath 06/01/12   single vessel CAD w/ patent mid LAD stent and normal LV systolic function  . Fibromyalgia    muscle aches in past in both forearms, before statin even  . GERD (gastroesophageal reflux disease)   . Hyperlipidemia LDL goal < 70   . Nephrolithiasis   . Testosterone deficiency 04/28/2008   History treatment around 2009. No current treatment.       Past Surgical History:  Procedure Laterality Date  . CARDIAC CATHETERIZATION  04/06/2012; 06/01/2012  . CATARACT EXTRACTION W/ INTRAOCULAR LENS IMPLANT  08/2011   right .Left 12/2014.   Marland Kitchen CORONARY  ANGIOPLASTY WITH STENT PLACEMENT  04/11/2012   DES LAD  . CYSTOSCOPY W/ STONE MANIPULATION  2009  . INGUINAL HERNIA REPAIR  1990's   right  . KNEE ARTHROSCOPY  1990's   right  . LEFT HEART CATHETERIZATION WITH CORONARY ANGIOGRAM N/A 06/01/2012   Procedure: LEFT HEART CATHETERIZATION WITH CORONARY ANGIOGRAM;  Surgeon: Burnell Blanks, MD;  Location: [facility] CATH LAB;  Service: Cardiovascular;  Laterality: N/A;  . LYMPHADENECTOMY Bilateral 08/18/2014   Procedure: LYMPHADENECTOMY;  Surgeon: Raynelle Bring, MD;  Location: [facility] ORS;  Service: Urology;  Laterality: Bilateral;  . PERCUTANEOUS CORONARY STENT INTERVENTION (PCI-S) N/A 04/11/2012   Procedure: PERCUTANEOUS CORONARY STENT INTERVENTION (PCI-S);  Surgeon: Burnell Blanks, MD;  Location: [facility] CATH LAB;  Service: Cardiovascular;  Laterality: N/A;  . ROBOT ASSISTED LAPAROSCOPIC RADICAL PROSTATECTOMY N/A 08/18/2014   Procedure: ROBOTIC ASSISTED LAPAROSCOPIC RADICAL PROSTATECTOMY LEVEL 2;  Surgeon: Raynelle Bring, MD;  Location: [facility] ORS;  Service: Urology;  Laterality: N/A;    Current Outpatient Medications  Medication Sig Dispense Refill  . atorvastatin (LIPITOR) 40 MG tablet Take 1 tablet (40 mg total) by mouth daily. 90 tablet 3  . clopidogrel (PLAVIX) 75 MG tablet TAKE 1 TABLET BY MOUTH  DAILY (Patient taking differently: Pt taking 1 tablet in the morning) 90 tablet 1  . cyanocobalamin 1000 MCG tablet Take 1,000 mcg by mouth daily.    . Melatonin 3 MG TABS Take 5 mg by mouth at bedtime. Patient  taking 5 mg at bedtime    . nitroGLYCERIN (NITROSTAT) 0.4 MG SL tablet Place 1 tablet (0.4 mg total) under the tongue every 5 (five) minutes as needed for chest pain. 25 tablet 2  . pantoprazole (PROTONIX) 20 MG tablet Take 1 tablet (20 mg total) by mouth every morning. 90 tablet 3  . polyvinyl alcohol (LIQUIFILM TEARS) 1.4 % ophthalmic solution Place 1 drop into both eyes daily as needed for dry eyes.    . sildenafil (VIAGRA) 100 MG tablet     .  sodium chloride (OCEAN) 0.65 % SOLN nasal spray Place 1 spray into both nostrils daily as needed for congestion.     No current facility-administered medications for this visit.     Allergies  Allergen Reactions  . Sulfa Antibiotics Rash    rash    Social History   Socioeconomic History  . Marital status: Married    Spouse name: Not on file  . Number of children: 2  . Years of education: Not on file  . Highest education level: Not on file  Occupational History  . Occupation: Retired  Scientific laboratory technician  . Financial resource strain: Not on file  . Food insecurity    Worry: Not on file    Inability: Not on file  . Transportation needs    Medical: Not on file    Non-medical: Not on file  Tobacco Use  . Smoking status: Never Smoker  . Smokeless tobacco: Never Used  Substance and Sexual Activity  . Alcohol use: No  . Drug use: No  . Sexual activity: Not Currently  Lifestyle  . Physical activity    Days per week: Not on file    Minutes per session: Not on file  . Stress: Not on file  Relationships  . Social Herbalist on phone: Not on file    Gets together: Not on file    Attends religious service: Not on file    Active member of club or organization: Not on file    Attends meetings of clubs or organizations: Not on file    Relationship status: Not on file  . Intimate partner violence    Fear of current or ex partner: Not on file    Emotionally abused: Not on file    Physically abused: Not on file    Forced sexual activity: Not on file  Other Topics Concern  . Not on file  Social History Narrative   Married (brother patient here, wife patient elsewhere). 2 children (daughter in [place], son in [place] with ticketmaster).       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 Diabetes Mother   . Heart  attack Mother 8  . Diabetes Father   . Heart attack Father 65  . Diabetes Sister   . Arthritis Brother     Review of Systems:  As stated in the HPI and otherwise negative.   BP 120/70   Pulse 63   Ht 5' 11.75" (1.822 m)   Wt 198 lb 12.8 oz (90.2 kg)   SpO2 95%   BMI 27.15 kg/m   Physical Examination:  General: Well developed, well nourished, NAD  HEENT: OP clear, mucus membranes moist  SKIN: warm, dry. No rashes. Neuro: No focal deficits  Musculoskeletal: Muscle strength 5/5 all ext  Psychiatric: Mood and affect normal  Neck: No JVD, no carotid bruits, no thyromegaly, no lymphadenopathy.  Lungs:Clear bilaterally, no wheezes, rhonci, crackles Cardiovascular: Regular rate and rhythm. No murmurs, gallops or rubs. Abdomen:Soft. Bowel sounds present. Non-tender.  Extremities: No lower extremity edema. Pulses are 2 + in the bilateral DP/PT.  Cardiac cath 06/01/12:  Left main: No obstructive disease noted.  Left Anterior Descending Artery: Moderate caliber vessel that courses to the apex. The proximal vessel has a 30% stenosis. The mid vessel has a patent stented segment with no evidence of restenosis. The distal LAD has no obstructive disease. The diagonal branch is covered by the LAD stent and has a 10% ostial stenosis. There is excellent flow down the diagonal branch.  Intermediate Artery: Small caliber vessel with 30% stenosis.  Circumflex Artery: Large caliber, co-dominant vessel with no obstructive disease noted. There are three small caliber obtuse marginal branches that have no obstructive disease noted.  Right Coronary Artery: Small caliber, co-dominant vessel with mild plaque in the proximal and mid segments.  Left Ventricular Angiogram: LVEF=60-65%   EKG:  EKG is ordered today. The ekg ordered today demonstrates NSR, rate 63 bpm  Recent Labs: 01/17/2019: ALT 19; BUN 24; Creatinine, Ser 1.15; Hemoglobin 14.6; Platelets 201.0; Potassium 4.5; Sodium 141   Lipid Panel     Component Value Date/Time   CHOL 155 01/17/2019 1136   TRIG 138.0 01/17/2019 1136   HDL 49.90 01/17/2019 1136   CHOLHDL 3 01/17/2019 1136   VLDL 27.6 01/17/2019 1136   LDLCALC 78 01/17/2019 1136   LDLDIRECT 86.0 10/16/2015 0841     Wt Readings from Last 3 Encounters:  03/07/19 198 lb 12.8 oz (90.2 kg)  01/17/19 198 lb (89.8 kg)  01/03/19 202 lb 12.8 oz (92 kg)     Other studies Reviewed: Additional studies/ records that were reviewed today include: . Review of the above records demonstrates:    Assessment and Plan:   1. CAD without angina: No chest pain. Continue Plavix and statin. He is not on a beta blocker due to bradycardia.   2. HLD: Lipids are followed in primary care. LDL near goal. Continue statin.   Current medicines are reviewed at length with the patient today.  The patient does not have concerns regarding medicines.  The following changes have been made:  no change  Labs/ tests ordered today include:   No orders of the defined types were placed in this encounter.    Disposition:   FU with me in 12  months   Signed, Lauree Chandler, MD 03/07/2019 9:29 AM    [facility] [address], [place], [place]  [address] Phone: [phone]; Fax: [phone]

## 2019-03-07 ENCOUNTER — Other Ambulatory Visit: Payer: Self-pay

## 2019-03-07 ENCOUNTER — Ambulatory Visit (INDEPENDENT_AMBULATORY_CARE_PROVIDER_SITE_OTHER): Payer: Commercial Managed Care - PPO | Admitting: Cardiovascular Disease

## 2019-03-07 ENCOUNTER — Encounter: Payer: Self-pay | Admitting: Cardiovascular Disease

## 2019-03-07 VITALS — BP 120/70 | HR 63 | Ht 71.75 in | Wt 198.8 lb

## 2019-03-07 DIAGNOSIS — I2510 Atherosclerotic heart disease of native coronary artery without angina pectoris: Secondary | ICD-10-CM

## 2019-03-07 DIAGNOSIS — E7800 Pure hypercholesterolemia, unspecified: Secondary | ICD-10-CM | POA: Diagnosis not present

## 2019-03-07 NOTE — Patient Instructions (Signed)

## 2019-03-29 ENCOUNTER — Encounter: Payer: Commercial Managed Care - PPO | Admitting: Family Medicine

## 2019-04-04 ENCOUNTER — Ambulatory Visit (INDEPENDENT_AMBULATORY_CARE_PROVIDER_SITE_OTHER): Payer: Commercial Managed Care - PPO

## 2019-04-04 ENCOUNTER — Other Ambulatory Visit: Payer: Self-pay

## 2019-04-04 DIAGNOSIS — Z23 Encounter for immunization: Secondary | ICD-10-CM

## 2019-04-30 DIAGNOSIS — M1990 Unspecified osteoarthritis, unspecified site: Secondary | ICD-10-CM | POA: Diagnosis not present

## 2019-04-30 DIAGNOSIS — I2510 Atherosclerotic heart disease of native coronary artery without angina pectoris: Secondary | ICD-10-CM | POA: Diagnosis not present

## 2019-04-30 DIAGNOSIS — M25439 Effusion, unspecified wrist: Secondary | ICD-10-CM | POA: Diagnosis not present

## 2019-04-30 DIAGNOSIS — M353 Polymyalgia rheumatica: Secondary | ICD-10-CM | POA: Diagnosis not present

## 2019-04-30 DIAGNOSIS — M79602 Pain in left arm: Secondary | ICD-10-CM | POA: Diagnosis not present

## 2019-04-30 DIAGNOSIS — E785 Hyperlipidemia, unspecified: Secondary | ICD-10-CM | POA: Diagnosis not present

## 2019-05-25 ENCOUNTER — Other Ambulatory Visit: Payer: Self-pay | Admitting: Cardiovascular Disease

## 2019-11-29 ENCOUNTER — Other Ambulatory Visit: Payer: Self-pay | Admitting: Family Medicine

## 2019-12-26 ENCOUNTER — Encounter: Payer: Self-pay | Admitting: Family Medicine

## 2020-01-20 ENCOUNTER — Encounter: Payer: Self-pay | Admitting: Family Medicine

## 2020-02-07 ENCOUNTER — Other Ambulatory Visit: Payer: Self-pay | Admitting: Cardiovascular Disease

## 2020-02-17 NOTE — Telephone Encounter (Signed)
Patient has been contacted and scheduled

## 2020-02-25 ENCOUNTER — Ambulatory Visit (INDEPENDENT_AMBULATORY_CARE_PROVIDER_SITE_OTHER): Payer: Commercial Managed Care - PPO | Admitting: Family Medicine

## 2020-02-25 ENCOUNTER — Encounter: Payer: Self-pay | Admitting: Family Medicine

## 2020-02-25 ENCOUNTER — Other Ambulatory Visit: Payer: Self-pay

## 2020-02-25 VITALS — BP 124/64 | HR 73 | Temp 98.1°F | Ht 71.75 in | Wt 195.0 lb

## 2020-02-25 DIAGNOSIS — E785 Hyperlipidemia, unspecified: Secondary | ICD-10-CM | POA: Diagnosis not present

## 2020-02-25 DIAGNOSIS — E538 Deficiency of other specified B group vitamins: Secondary | ICD-10-CM

## 2020-02-25 DIAGNOSIS — Z0000 Encounter for general adult medical examination without abnormal findings: Secondary | ICD-10-CM

## 2020-02-25 DIAGNOSIS — Z79899 Other long term (current) drug therapy: Secondary | ICD-10-CM

## 2020-02-25 DIAGNOSIS — Z125 Encounter for screening for malignant neoplasm of prostate: Secondary | ICD-10-CM | POA: Diagnosis not present

## 2020-02-25 DIAGNOSIS — E559 Vitamin D deficiency, unspecified: Secondary | ICD-10-CM

## 2020-02-25 NOTE — Patient Instructions (Addendum)
Health Maintenance Due  Topic Date Due  . INFLUENZA VACCINE - will complete later in flu season (please let us know if you get this at another location so we can update your chart) . We should have vaccination here in 1-2 months - can call back for an appointment.   02/09/2020   Please stop by lab before you go If you have mychart- we will send your results within 3 business days of [place] receiving them.  If you do not have mychart- we will call you about results within 5 business days of [place] receiving them.  *please note we are currently using [facility] which has a longer processing time than [place] typically so labs may not come back as quickly as in the past *please also note that you will see labs on mychart as soon as they post. I will later go in and write notes on them- will say "notes from Dr. Yong Channel"    Richard Mahoney , Thank you for taking time to come for your Medicare Wellness Visit. I appreciate your ongoing commitment to your health goals. Please review the following plan we discussed and let me know if I can assist you in the future.   These are the goals we discussed: 1.  Continue to walk regularly 2.  Maintain healthy diet rich in fruits and vegetables and low in processed food 3. Consider shingrix shot- Please check with your pharmacy to see if they have the shingrix vaccine. If they do- please get this immunization and update [place] by phone call or mychart with dates you receive the vaccine  This is a list of the screening recommended for you and due dates:  Health Maintenance  Topic Date Due  . Flu Shot  02/09/2020  .  Hepatitis C: One time screening is recommended by Center for Disease Control  (CDC) for  adults born from [birth year] through 1965.   04/12/2060*  . Tetanus Vaccine  10/05/2026  . COVID-19 Vaccine  Completed  . Pneumonia vaccines  Completed  *Topic was postponed. The date shown is not the original due date.      Recommended follow up: Return in about 1 year  (around 02/24/2021) for physical or sooner if needed.

## 2020-02-25 NOTE — Progress Notes (Signed)
Phone [phone]    Subjective:  Patient presents today for their annual wellness visit (subsequent )  Preventive Screening-Counseling & Management  Modifiable Risk Factors/behavioral risk assessment/psychosocial risk assessment Regular exercise:  walking most days 35 mins- 2 miles Diet:  reasonably health- down 3 lbs from last year  Wt Readings from Last 3 Encounters:  02/25/20 195 lb (88.5 kg)  03/07/19 198 lb 12.8 oz (90.2 kg)  01/17/19 198 lb (89.8 kg)   Smoking Status: Never Smoker Second Hand Smoking status: No smokers in home Alcohol intake: does not drink weekly- a few per month  Cardiac risk factors:  Known CAD advanced age (older than 1 for men, 65 for women)  treated Hyperlipidemia  no Hypertension  No diabetes. Get fasting CBG No results found for: HGBA1C Family History:  Yes in mother and father  Family History  Problem Relation Age of Onset  . Diabetes Mother   . Heart attack Mother 34  . Diabetes Father   . Heart attack Father 50  . Diabetes Sister   . Arthritis Brother    Depression Screen/risk evaluation Risk factors:  Low risk.Marland Kitchen PHQ2 0  Depression screen [facility] 2/9 02/25/2020 01/17/2019 01/17/2019 11/04/2016 11/04/2016  Decreased Interest 0 0 0 0 0  Down, Depressed, Hopeless 0 0 0 0 0  PHQ - 2 Score 0 0 0 0 0  Altered sleeping 0 - - - -  Tired, decreased energy 0 - - - -  Change in appetite 0 - - - -  Feeling bad or failure about yourself  0 - - - -  Trouble concentrating 0 - - - -  Moving slowly or fidgety/restless 0 - - - -  Suicidal thoughts 0 - - - -  PHQ-9 Score 0 - - - -  Difficult doing work/chores Not difficult at all - - - -   Functional ability and level of safety Mobility assessment:  timed get up and go <12 seconds Activities of Daily Living- Independent in ADLs (toileting, bathing, dressing, transferring, eating) and in IADLs (shopping, housekeeping, managing own medications, and handling finances) Home Safety: Loose rugs (no), smoke  detectors (up to date), small pets (no), grab bars (yes), stairs (no issues- has upstairs bonus room that seldom goes to but no problems), life-alert system (would use cell phones) Hearing Difficulties: -patient declines Fall Risk: None  Fall Risk  02/25/2020 01/17/2019 11/04/2016 11/04/2016 10/27/2015  Falls in the past year? 0 0 No No No  Number falls in past yr: 0 - - - -  Injury with Fall? 0 - - - -  Opioid use history:  no long term opioids use Self assessment of health status: "very good"   Cognitive Testing             No reported trouble.   Mini cog: normal clock draw. 3/3 delayed recall. Normal test result   List the Names of Other Physician/Practitioners you currently use: Patient Care Team: Marin Olp, MD as PCP - General (Family Medicine) Burnell Blanks, MD as PCP - Cardiology (Cardiology) Raynelle Bring, MD as Consulting Physician (Urology) Valinda Party, MD as Consulting Physician (Rheumatology)  Required Immunizations needed today:   Discussed shingrix and fall flu shot  Immunization History  Administered Date(s) Administered  . Fluad Quad(high Dose 65+) 04/04/2019  . Influenza Split 05/30/2011, 05/15/2012  . Influenza Whole 03/26/2008, 04/22/2010  . Influenza, High Dose Seasonal PF 04/26/2013, 04/15/2014, 04/28/2016, 03/27/2018  . Influenza,inj,Quad PF,6+ Mos 03/17/2015  . Influenza-Unspecified 04/13/2017  .  Moderna SARS-COVID-2 Vaccination 07/31/2019, 08/28/2019  . Pneumococcal Conjugate-13 04/15/2014  . Pneumococcal Polysaccharide-23 04/22/2010  . Td 07/12/1995, 03/26/2008  . Tdap 10/04/2016  . Zoster 03/26/2008   Health Maintenance  Topic Date Due  . Flu Shot  02/09/2020  .  Hepatitis C: One time screening is recommended by Center for Disease Control  (CDC) for  adults born from [birth year] through 1967.   04/12/2060*  . Tetanus Vaccine  10/05/2026  . COVID-19 Vaccine  Completed  . Pneumonia vaccines  Completed  *Topic was postponed. The date shown  is not the original due date.    Screening tests-  1. Colon cancer screening- no further colonoscopies per [facility] health with last colonoscopy 09/26/2017 2. Lung Cancer screening-never smoker 3. Skin cancer screening- does not see dermatology.  Denies any lesions on his skin he was worried about 4. Prostate cancer screening-follows with urology for prostate cancer history Lab Results  Component Value Date   PSA 0.015ng 11/21/2018   PSA 0.015 11/08/2017   PSA undetectable 10/25/2016   The following were reviewed and entered/updated in epic if appropriate: Past Medical History:  Diagnosis Date  . Coronary artery disease    a. ETT 9/13: + ischemic ECG changes;  b. ETT-MV 9/13: EF 62%;  + ischemic EKG changes and + inf ischemia;  c. LHC 9/13: pLAD 30%, mLAD 70-80%, oDx 40-50% ==> FFR hemodynamically significant stenosis in mLAD ==> PCI: Xience Xpedition DES to mLAD; Cath 06/01/12   single vessel CAD w/ patent mid LAD stent and normal LV systolic function  . Fibromyalgia    muscle aches in past in both forearms, before statin even  . GERD (gastroesophageal reflux disease)   . Hyperlipidemia LDL goal < 70   . Nephrolithiasis   . Testosterone deficiency 04/28/2008   History treatment around 2009. No current treatment.      Patient Active Problem List   Diagnosis Date Noted  . History of prostate cancer 08/18/2014    Priority: High  . Coronary atherosclerosis of native coronary artery 04/12/2012    Priority: High  . Vitamin B12 deficiency 10/31/2017    Priority: Medium  . GERD (gastroesophageal reflux disease) 03/17/2015    Priority: Medium  . Hyperlipidemia 04/25/2012    Priority: Medium  . Insomnia 04/22/2010    Priority: Low  . NEPHROLITHIASIS, HX OF 03/26/2008    Priority: Low   Past Surgical History:  Procedure Laterality Date  . CARDIAC CATHETERIZATION  04/06/2012; 06/01/2012  . CATARACT EXTRACTION W/ INTRAOCULAR LENS IMPLANT  08/2011   right .Left 12/2014.   Marland Kitchen CORONARY  ANGIOPLASTY WITH STENT PLACEMENT  04/11/2012   DES LAD  . CYSTOSCOPY W/ STONE MANIPULATION  2009  . INGUINAL HERNIA REPAIR  1990's   right  . KNEE ARTHROSCOPY  1990's   right  . LEFT HEART CATHETERIZATION WITH CORONARY ANGIOGRAM N/A 06/01/2012   Procedure: LEFT HEART CATHETERIZATION WITH CORONARY ANGIOGRAM;  Surgeon: Burnell Blanks, MD;  Location[facility] CATH LAB;  Service: Cardiovascular;  Laterality: N/A;  . LYMPHADENECTOMY Bilateral 08/18/2014   Procedure: LYMPHADENECTOMY;  Surgeon: Raynelle Bring, MD;  Location: [facility] ORS;  Service: Urology;  Laterality: Bilateral;  . PERCUTANEOUS CORONARY STENT INTERVENTION (PCI-S) N/A 04/11/2012   Procedure: PERCUTANEOUS CORONARY STENT INTERVENTION (PCI-S);  Surgeon: Burnell Blanks, MD;  Location: [facility] CATH LAB;  Service: Cardiovascular;  Laterality: N/A;  . ROBOT ASSISTED LAPAROSCOPIC RADICAL PROSTATECTOMY N/A 08/18/2014   Procedure: ROBOTIC ASSISTED LAPAROSCOPIC RADICAL PROSTATECTOMY LEVEL 2;  Surgeon: Raynelle Bring, MD;  Location:  [facility] ORS;  Service: Urology;  Laterality: N/A;    Family History  Problem Relation Age of Onset  . Diabetes Mother   . Heart attack Mother 69  . Diabetes Father   . Heart attack Father 47  . Diabetes Sister   . Arthritis Brother     Medications- reviewed and updated Current Outpatient Medications  Medication Sig Dispense Refill  . atorvastatin (LIPITOR) 40 MG tablet TAKE 1 TABLET BY MOUTH  DAILY 90 tablet 3  . clopidogrel (PLAVIX) 75 MG tablet Take 1 tablet (75 mg total) by mouth daily. Please keep upcoming appt with Dr. Angelena Form in November before anymore refills. Thank you 90 tablet 0  . cyanocobalamin 1000 MCG tablet Take 1,000 mcg by mouth daily.    . Melatonin 3 MG TABS Take 5 mg by mouth at bedtime. Patient taking 5 mg at bedtime    . nitroGLYCERIN (NITROSTAT) 0.4 MG SL tablet Place 1 tablet (0.4 mg total) under the tongue every 5 (five) minutes as needed for chest pain. 25 tablet 2  . pantoprazole  (PROTONIX) 20 MG tablet TAKE 1 TABLET BY MOUTH IN  THE MORNING 90 tablet 3  . polyvinyl alcohol (LIQUIFILM TEARS) 1.4 % ophthalmic solution Place 1 drop into both eyes daily as needed for dry eyes.    . sodium chloride (OCEAN) 0.65 % SOLN nasal spray Place 1 spray into both nostrils daily as needed for congestion.     No current facility-administered medications for this visit.    Allergies-reviewed and updated Allergies  Allergen Reactions  . Sulfa Antibiotics Rash    rash    Social History   Socioeconomic History  . Marital status: Married    Spouse name: Not on file  . Number of children: 2  . Years of education: Not on file  . Highest education level: Not on file  Occupational History  . Occupation: Retired  Tobacco Use  . Smoking status: Never Smoker  . Smokeless tobacco: Never Used  Substance and Sexual Activity  . Alcohol use: No  . Drug use: No  . Sexual activity: Not Currently  Other Topics Concern  . Not on file  Social History Narrative   Married (brother patient here, wife patient elsewhere). 2 children (daughter in [place], son in [place] with ticketmaster).       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 Difficulty of Paying Living Expenses:   Food Insecurity:   . Worried About Charity fundraiser in the Last Year:   . Arboriculturist in the Last Year:   Transportation Needs:   . Film/video editor (Medical):   Marland Kitchen Lack of Transportation (Non-Medical):   Physical Activity:   . Days of Exercise per Week:   . Minutes of Exercise per Session:   Stress:   . Feeling of Stress :   Social Connections:   . Frequency of Communication with Friends and Family:   . Frequency of Social Gatherings with Friends and Family:   . Attends Religious Services:   . Active Member of Clubs or  Organizations:   . Attends Archivist Meetings:   Marland Kitchen Marital Status:       Objective:  BP 124/64   Pulse 73   Temp 98.1 F (36.7 C) (Temporal)   Ht 5'  11.75" (1.822 m)   Wt 195 lb (88.5 kg)   SpO2 99%   BMI 26.63 kg/m  Gen: NAD, resting comfortably   Assessment/Plan:  AWV completed 1. Educated, counseled and referred based on above elements 2. Educated, counseled and referred as appropriate for preventative needs 3. Discussed and documented a written plan for preventiative services and screenings with personalized health advice- After Visit Summary was given to patient which included this plan   Status of chronic or acute concerns  See physical template from today  Recommended follow up: 1 year AWV Future Appointments  Date Time Provider [place]  05/11/2020  4:20 PM Burnell Blanks, MD [id number]     Lab/Order associations:   ICD-10-CM  1. Preventative health care  Z00.00      Return precautions advised.  Garret Reddish, MD

## 2020-02-25 NOTE — Progress Notes (Signed)
Phone: [phone]   Subjective:  Patient presents today for their annual physical. Chief complaint-noted.   See problem oriented charting- Review of Systems  Constitutional: Negative for chills and fever.  HENT: Negative for hearing loss and tinnitus.   Eyes: Negative for blurred vision and double vision.  Respiratory: Negative for cough, shortness of breath and wheezing.   Cardiovascular: Negative for chest pain, palpitations and leg swelling.  Gastrointestinal: Negative for heartburn, nausea and vomiting.  Genitourinary: Negative for dysuria, frequency and urgency.  Musculoskeletal: Negative for back pain, joint pain and neck pain.  Skin: Negative for rash.  Neurological: Negative for dizziness, seizures, weakness and headaches.  Endo/Heme/Allergies: Does not bruise/bleed easily.  Psychiatric/Behavioral: Negative for depression, memory loss and suicidal ideas. The patient does not have insomnia.    The following were reviewed and entered/updated in epic: Past Medical History:  Diagnosis Date  . Coronary artery disease    a. ETT 9/13: + ischemic ECG changes;  b. ETT-MV 9/13: EF 62%;  + ischemic EKG changes and + inf ischemia;  c. LHC 9/13: pLAD 30%, mLAD 70-80%, oDx 40-50% ==> FFR hemodynamically significant stenosis in mLAD ==> PCI: Xience Xpedition DES to mLAD; Cath 06/01/12   single vessel CAD w/ patent mid LAD stent and normal LV systolic function  . Fibromyalgia    muscle aches in past in both forearms, before statin even  . GERD (gastroesophageal reflux disease)   . Hyperlipidemia LDL goal < 70   . Nephrolithiasis   . Testosterone deficiency 04/28/2008   History treatment around 2009. No current treatment.      Patient Active Problem List   Diagnosis Date Noted  . Vitamin B12 deficiency 10/31/2017  . GERD (gastroesophageal reflux disease) 03/17/2015  . History of prostate cancer 08/18/2014  . Hyperlipidemia 04/25/2012  . Coronary atherosclerosis of native coronary  artery 04/12/2012  . Insomnia 04/22/2010  . NEPHROLITHIASIS, HX OF 03/26/2008   Past Surgical History:  Procedure Laterality Date  . CARDIAC CATHETERIZATION  04/06/2012; 06/01/2012  . CATARACT EXTRACTION W/ INTRAOCULAR LENS IMPLANT  08/2011   right .Left 12/2014.   Marland Kitchen CORONARY ANGIOPLASTY WITH STENT PLACEMENT  04/11/2012   DES LAD  . CYSTOSCOPY W/ STONE MANIPULATION  2009  . INGUINAL HERNIA REPAIR  1990's   right  . KNEE ARTHROSCOPY  1990's   right  . LEFT HEART CATHETERIZATION WITH CORONARY ANGIOGRAM N/A 06/01/2012   Procedure: LEFT HEART CATHETERIZATION WITH CORONARY ANGIOGRAM;  Surgeon: Burnell Blanks, MD;  Location[facility] C[facility] LAB;  Service: Cardiovascular;  Laterality: N/A;  . LYMPHADENECTOMY Bilateral 08/18/2014   Procedure: LYMPHADENECTOMY;  Surgeon: Raynelle Bring, MD;  Location: [facility] ORS;  Service: Urology;  Laterality: Bilateral;  . PERCUTANEOUS CORONARY STENT INTERVENTION (PCI-S) N/A 04/11/2012   Procedure: PERCUTANEOUS CORONARY STENT INTERVENTION (PCI-S);  Surgeon: Burnell Blanks, MD;  Location: [facility] CATH LAB;  Service: Cardiovascular;  Laterality: N/A;  . ROBOT ASSISTED LAPAROSCOPIC RADICAL PROSTATECTOMY N/A 08/18/2014   Procedure: ROBOTIC ASSISTED LAPAROSCOPIC RADICAL PROSTATECTOMY LEVEL 2;  Surgeon: Raynelle Bring, MD;  Location: [facility] ORS;  Service: Urology;  Laterality: N/A;    Family History  Problem Relation Age of Onset  . Diabetes Mother   . Heart attack Mother 19  . Diabetes Father   . Heart attack Father 30  . Diabetes Sister   . Arthritis Brother     Medications- reviewed and updated Current Outpatient Medications  Medication Sig Dispense Refill  . atorvastatin (LIPITOR) 40 MG tablet TAKE 1 TABLET BY MOUTH  DAILY  90 tablet 3  . clopidogrel (PLAVIX) 75 MG tablet Take 1 tablet (75 mg total) by mouth daily. Please keep upcoming appt with Dr. Angelena Form in November before anymore refills. Thank you 90 tablet 0  . cyanocobalamin 1000 MCG tablet Take 1,000 mcg by  mouth daily.    . Melatonin 3 MG TABS Take 5 mg by mouth at bedtime. Patient taking 5 mg at bedtime    . nitroGLYCERIN (NITROSTAT) 0.4 MG SL tablet Place 1 tablet (0.4 mg total) under the tongue every 5 (five) minutes as needed for chest pain. 25 tablet 2  . pantoprazole (PROTONIX) 20 MG tablet TAKE 1 TABLET BY MOUTH IN  THE MORNING 90 tablet 3  . polyvinyl alcohol (LIQUIFILM TEARS) 1.4 % ophthalmic solution Place 1 drop into both eyes daily as needed for dry eyes.    . sodium chloride (OCEAN) 0.65 % SOLN nasal spray Place 1 spray into both nostrils daily as needed for congestion.     No current facility-administered medications for this visit.    Allergies-reviewed and updated Allergies  Allergen Reactions  . Sulfa Antibiotics Rash    rash    Social History   Social History Narrative   Married (brother patient here, wife patient elsewhere). 2 children (daughter in [place], son in [place] with [employer]).       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 Oropharynx normal Neck: no thyromegaly CV: RRR no murmurs rubs or gallops Lungs: CTAB no crackles, wheeze, rhonchi Abdomen: soft/nontender/nondistended/normal bowel sounds. No rebound or guarding.  Ext: no edema Skin: warm, dry Neuro: grossly normal, moves all extremities, PERRLA   Assessment and Plan  74 y.o. male presenting for annual physical.  Health Maintenance counseling: 1. Anticipatory guidance: Patient counseled regarding regular dental exams q6 months, eye exams yearly,  avoiding smoking and second hand smoke , limiting alcohol to 2 beverages per day - Occasional beer 1-2 times a  month   2. Risk factor reduction:  Advised patient of need for regular exercise and diet rich and fruits and vegetables to reduce risk of heart attack and stroke. Exercise-  walks daily 35 minutes about 2 miles. Diet-tries to have healthy diet. Avoids fried foods.  Weight down slightly Wt Readings from Last 3 Encounters:  02/25/20 195 lb (88.5 kg)  03/07/19 198 lb 12.8 oz (90.2 kg)  01/17/19 198 lb (89.8 kg)  3. Immunizations/screenings/ancillary studies-advised flu shot in the fall.  Discussed Shingrix  Immunization History  Administered Date(s) Administered  . Fluad Quad(high Dose 65+) 04/04/2019  . Influenza Split 05/30/2011, 05/15/2012  . Influenza Whole 03/26/2008, 04/22/2010  . Influenza, High Dose Seasonal PF 04/26/2013, 04/15/2014, 04/28/2016, 03/27/2018  . Influenza,inj,Quad PF,6+ Mos 03/17/2015  . Influenza-Unspecified 04/13/2017  . Moderna SARS-COVID-2 Vaccination 07/31/2019, 08/28/2019  . Pneumococcal Conjugate-13 04/15/2014  . Pneumococcal Polysaccharide-23 04/22/2010  . Td 07/12/1995, 03/26/2008  . Tdap 10/04/2016  . Zoster 03/26/2008  4. Prostate cancer screening- continues to do well after prostatectomy.  Gets PSAs with urology- undetectable this year Lab Results  Component Value Date   PSA 0.015ng 11/21/2018   PSA  0.015 11/08/2017   PSA undetectable 10/25/2016   5. Colon cancer screening -  no longer gets.  No further repeat per [facility] with colonoscopy 09/26/2017 6. Skin cancer screening- advised regular sunscreen use. Denies worrisome, changing, or new skin lesions. Does not see dermatology no spots that he is concerned about.  7. never smoker 8. STD screening - opts out as monogamous   Status of chronic or acute concerns   #Hyperlipidemia/CAD-compliant with atorvastatin 40 mg daily-increased dose last year from 20 mg.  Also compliant with Plavix-not on aspirin.  Has never had to use nitroglycerin.  Follows with Dr. Susanne Greenhouse.  LDL goal under 70-update  lipids today-hopefully now at goal Lab Results  Component Value Date   CHOL 155 01/17/2019   HDL 49.90 01/17/2019   LDLCALC 78 01/17/2019   LDLDIRECT 86.0 10/16/2015   TRIG 138.0 01/17/2019   CHOLHDL 3 01/17/2019   #GERD-on Protonix 20 mg daily.  Have discussed Pepcid in the past but does not like twice daily dosing.  #Insomnia-melatonin 5 mg remains helpful  #B12 deficiency-remains on over-the-counter B12.  Update B12 levels with labs today  #Polymyalgia rheumatica history-remains off prednisone and methotrexate for now 2 years-followed with Dr. Dossie Der previously- available if needed  #Vitamin D deficiency-detected several years ago.  He remains on 4000 units daily.  Update vitamin D level with labs today  Recommended follow up: 1 year physical Future Appointments  Date Time Provider [place]  02/25/2020  9:20 AM Marin Olp, MD [facility]  05/11/2020  4:20 PM Burnell Blanks, MD [facility]    Lab/Order associations: fasting   ICD-10-CM   1. Preventative health care  Z00.00 CBC with Differential/Platelet    Comprehensive metabolic panel    Lipid panel  2. Hyperlipidemia, unspecified hyperlipidemia type  E78.5 CBC with Differential/Platelet    Comprehensive metabolic panel    Lipid panel  3. Screening for prostate cancer  Z12.5 PSA  4. Vitamin B12 deficiency  E53.8 B12  5. High risk medication use  Z79.899 B12    VITAMIN D 25 Hydroxy (Vit-D Deficiency, Fractures)    No orders of the defined types were placed in this encounter.   Return precautions advised.  Francella Solian, CMA

## 2020-02-26 LAB — CBC WITH DIFFERENTIAL/PLATELET
Absolute Monocytes: 398 cells/uL (ref 200–950)
Basophils Absolute: 70 cells/uL (ref 0–200)
Basophils Relative: 1.7 %
Eosinophils Absolute: 279 cells/uL (ref 15–500)
Eosinophils Relative: 6.8 %
HCT: 44 % (ref 38.5–50.0)
Hemoglobin: 14.8 g/dL (ref 13.2–17.1)
Lymphs Abs: 1406 cells/uL (ref 850–3900)
MCH: 31 pg (ref 27.0–33.0)
MCHC: 33.6 g/dL (ref 32.0–36.0)
MCV: 92.1 fL (ref 80.0–100.0)
MPV: 10.5 fL (ref 7.5–12.5)
Monocytes Relative: 9.7 %
Neutro Abs: 1948 cells/uL (ref 1500–7800)
Neutrophils Relative %: 47.5 %
Platelets: 237 10*3/uL (ref 140–400)
RBC: 4.78 10*6/uL (ref 4.20–5.80)
RDW: 12.3 % (ref 11.0–15.0)
Total Lymphocyte: 34.3 %
WBC: 4.1 10*3/uL (ref 3.8–10.8)

## 2020-02-26 LAB — LIPID PANEL
Cholesterol: 144 mg/dL (ref <200)
HDL: 46 mg/dL (ref >=40)
LDL Cholesterol (Calc): 77 mg/dL (calc)
Non-HDL Cholesterol (Calc): 98 mg/dL (calc) (ref <130)
Total CHOL/HDL Ratio: 3.1 (calc) (ref <5.0)
Triglycerides: 131 mg/dL (ref <150)

## 2020-02-26 LAB — COMPREHENSIVE METABOLIC PANEL
AG Ratio: 2 (calc) (ref 1.0–2.5)
ALT: 20 U/L (ref 9–46)
AST: 20 U/L (ref 10–35)
Albumin: 4.4 g/dL (ref 3.6–5.1)
Alkaline phosphatase (APISO): 58 U/L (ref 35–144)
BUN/Creatinine Ratio: 14 (calc) (ref 6–22)
BUN: 18 mg/dL (ref 7–25)
CO2: 29 mmol/L (ref 20–32)
Calcium: 9.5 mg/dL (ref 8.6–10.3)
Chloride: 103 mmol/L (ref 98–110)
Creat: 1.29 mg/dL — ABNORMAL HIGH (ref 0.70–1.18)
Globulin: 2.2 g/dL (calc) (ref 1.9–3.7)
Glucose, Bld: 90 mg/dL (ref 65–99)
Potassium: 4.5 mmol/L (ref 3.5–5.3)
Sodium: 139 mmol/L (ref 135–146)
Total Bilirubin: 1.3 mg/dL — ABNORMAL HIGH (ref 0.2–1.2)
Total Protein: 6.6 g/dL (ref 6.1–8.1)

## 2020-02-26 LAB — VITAMIN D 25 HYDROXY (VIT D DEFICIENCY, FRACTURES): Vit D, 25-Hydroxy: 48 ng/mL (ref 30–100)

## 2020-02-26 LAB — VITAMIN B12: Vitamin B-12: 1183 pg/mL — ABNORMAL HIGH (ref 200–1100)

## 2020-03-06 ENCOUNTER — Telehealth: Payer: Self-pay | Admitting: Nurse Practitioner

## 2020-03-06 ENCOUNTER — Telehealth: Payer: Self-pay | Admitting: Cardiovascular Disease

## 2020-03-06 ENCOUNTER — Other Ambulatory Visit: Payer: Self-pay | Admitting: Nurse Practitioner

## 2020-03-06 ENCOUNTER — Telehealth: Payer: Self-pay

## 2020-03-06 NOTE — Telephone Encounter (Signed)
Thanks!    [name].

## 2020-03-06 NOTE — Telephone Encounter (Signed)
I connected by phone with Richard Mahoney on 03/06/2020 at 1:52 PM to discuss the potential use of a new treatment for mild to moderate COVID-19 viral infection in non-hospitalized patients.  This patient is a 74 y.o. male that meets the FDA criteria for Emergency Use Authorization of COVID monoclonal antibody casirivimab/imdevimab.  Has a (+) direct SARS-CoV-2 viral test result  Has mild or moderate COVID-19   Is NOT hospitalized due to COVID-19  Is within 10 days of symptom onset  Has at least one of the high risk factor(s) for progression to severe COVID-19 and/or hospitalization as defined in EUA.  Specific high risk criteria : Cardiovascular disease or hypertension; age   I have spoken and communicated the following to the patient or parent/caregiver regarding COVID monoclonal antibody treatment:  1. FDA has authorized the emergency use for the treatment of mild to moderate COVID-19 in adults and pediatric patients with positive results of direct SARS-CoV-2 viral testing who are 74 years of age and older weighing at least 40 kg, and who are at high risk for progressing to severe COVID-19 and/or hospitalization.  2. The significant known and potential risks and benefits of COVID monoclonal antibody, and the extent to which such potential risks and benefits are unknown.  3. Information on available alternative treatments and the risks and benefits of those alternatives, including clinical trials.  4. Patients treated with COVID monoclonal antibody should continue to self-isolate and use infection control measures (e.g., wear mask, isolate, social distance, avoid sharing personal items, clean and disinfect "high touch" surfaces, and frequent handwashing) according to CDC guidelines.   5. The patient or parent/caregiver has the option to accept or refuse COVID monoclonal antibody treatment.  After reviewing this information with the patient, The patient agreed to proceed with receiving  casirivimab\imdevimab infusion and will be provided a copy of the Fact sheet prior to receiving the infusion.   [name], NP 03/06/2020 1:52 PM

## 2020-03-06 NOTE — Telephone Encounter (Signed)
° ° °  Pt said he is tested positive for covid 19 this morning and he is now on his way to [place] infusion center. He said if Dr. Angelena Form has any input about his situation he would like to know.

## 2020-03-06 NOTE — Telephone Encounter (Signed)
Pt tested positive for Covid this morning. He believes he has been having symptoms for 3 days. Pt says Dr Yong Channel told him to call if he got covid and Dr. Yong Channel could help him.

## 2020-03-06 NOTE — Telephone Encounter (Signed)
Thanks

## 2020-03-06 NOTE — Telephone Encounter (Signed)
Called and started order for infusion. Patient has been in formed will get someone to pick up pulse ox. I have provided with number if he does not hear from them by lunch to call and see if he can set up appointment.

## 2020-03-06 NOTE — Progress Notes (Signed)
I connected by phone with Richard Mahoney on 03/06/2020 at 1:54 PM to discuss the potential use of a new treatment for mild to moderate COVID-19 viral infection in non-hospitalized patients.  This patient is a 74 y.o. male that meets the FDA criteria for Emergency Use Authorization of COVID monoclonal antibody casirivimab/imdevimab.  Has a (+) direct SARS-CoV-2 viral test result  Has mild or moderate COVID-19   Is NOT hospitalized due to COVID-19  Is within 10 days of symptom onset  Has at least one of the high risk factor(s) for progression to severe COVID-19 and/or hospitalization as defined in EUA.  Specific high risk criteria : Cardiovascular disease or hypertension; age   I have spoken and communicated the following to the patient or parent/caregiver regarding COVID monoclonal antibody treatment:  1. FDA has authorized the emergency use for the treatment of mild to moderate COVID-19 in adults and pediatric patients with positive results of direct SARS-CoV-2 viral testing who are 38 years of age and older weighing at least 40 kg, and who are at high risk for progressing to severe COVID-19 and/or hospitalization.  2. The significant known and potential risks and benefits of COVID monoclonal antibody, and the extent to which such potential risks and benefits are unknown.  3. Information on available alternative treatments and the risks and benefits of those alternatives, including clinical trials.  4. Patients treated with COVID monoclonal antibody should continue to self-isolate and use infection control measures (e.g., wear mask, isolate, social distance, avoid sharing personal items, clean and disinfect "high touch" surfaces, and frequent handwashing) according to CDC guidelines.   5. The patient or parent/caregiver has the option to accept or refuse COVID monoclonal antibody treatment.  After reviewing this information with the patient, The patient agreed to proceed with receiving  casirivimab\imdevimab infusion and will be provided a copy of the Fact sheet prior to receiving the infusion.   [name], NP 03/06/2020 1:54 PM

## 2020-03-06 NOTE — Telephone Encounter (Signed)
Yes please get him set up for outpatient antibody infusion ASAP.  Please tell him if oxygen levels get below 94% consistently at home would still recommend seeking care in the hospital even with the treatment with hoping he will stay above this

## 2020-03-06 NOTE — Telephone Encounter (Signed)
Do you want me to set up for infusion?

## 2020-03-07 ENCOUNTER — Ambulatory Visit (HOSPITAL_COMMUNITY)
Admission: RE | Admit: 2020-03-07 | Discharge: 2020-03-07 | Disposition: A | Discharge status: Home / Self Care | Payer: Commercial Managed Care - PPO | Source: Ambulatory Visit | Attending: Pulmonary Disease | Admitting: Pulmonary Disease

## 2020-03-07 DIAGNOSIS — Z23 Encounter for immunization: Secondary | ICD-10-CM | POA: Diagnosis not present

## 2020-03-07 DIAGNOSIS — U071 COVID-19: Secondary | ICD-10-CM | POA: Diagnosis not present

## 2020-03-07 MED ORDER — SODIUM CHLORIDE 0.9 % IV SOLN: INTRAVENOUS | Status: DC | PRN

## 2020-03-07 MED ORDER — METHYLPREDNISOLONE SODIUM SUCC 125 MG IJ SOLR: 125.0000 mg | Freq: Once | INTRAMUSCULAR | Status: DC | PRN

## 2020-03-07 MED ORDER — DIPHENHYDRAMINE HCL 50 MG/ML IJ SOLN: 50.0000 mg | Freq: Once | INTRAMUSCULAR | Status: DC | PRN

## 2020-03-07 MED ORDER — SODIUM CHLORIDE 0.9 % IV SOLN
1200.0000 mg | Freq: Once | INTRAVENOUS | Status: AC
  Administered 2020-03-07: 1200 mg via INTRAVENOUS
  Filled 2020-03-07: qty 10

## 2020-03-07 MED ORDER — ALBUTEROL SULFATE HFA 108 (90 BASE) MCG/ACT IN AERS: 2.0000 | INHALATION_SPRAY | Freq: Once | RESPIRATORY_TRACT | Status: DC | PRN

## 2020-03-07 MED ORDER — FAMOTIDINE IN NACL 20-0.9 MG/50ML-% IV SOLN: 20.0000 mg | Freq: Once | INTRAVENOUS | Status: DC | PRN

## 2020-03-07 MED ORDER — EPINEPHRINE 0.3 MG/0.3ML IJ SOAJ: 0.3000 mg | Freq: Once | INTRAMUSCULAR | Status: DC | PRN

## 2020-03-07 NOTE — Discharge Instructions (Signed)

## 2020-03-07 NOTE — Progress Notes (Signed)
  Diagnosis: COVID-19  Physician: Wayne Both, MD  Procedure: [facility] Infusion [facility] Med: casirivimab\imdevimab infusion - Provided patient with casirivimab\imdevimab fact sheet for patients, parents and caregivers prior to infusion.  Complications: No immediate complications noted.  Discharge: Discharged home   Richard Mahoney 03/07/2020

## 2020-03-09 ENCOUNTER — Encounter: Payer: Self-pay | Admitting: Family Medicine

## 2020-04-15 ENCOUNTER — Other Ambulatory Visit: Payer: Self-pay

## 2020-04-15 ENCOUNTER — Ambulatory Visit (INDEPENDENT_AMBULATORY_CARE_PROVIDER_SITE_OTHER): Payer: Commercial Managed Care - PPO

## 2020-04-15 ENCOUNTER — Encounter: Payer: Self-pay | Admitting: Family Medicine

## 2020-04-15 DIAGNOSIS — Z23 Encounter for immunization: Secondary | ICD-10-CM | POA: Diagnosis not present

## 2020-04-24 ENCOUNTER — Encounter: Payer: Commercial Managed Care - PPO | Admitting: Family Medicine

## 2020-04-26 ENCOUNTER — Encounter: Payer: Self-pay | Admitting: Family Medicine

## 2020-05-11 ENCOUNTER — Ambulatory Visit (INDEPENDENT_AMBULATORY_CARE_PROVIDER_SITE_OTHER): Payer: Commercial Managed Care - PPO | Admitting: Cardiovascular Disease

## 2020-05-11 ENCOUNTER — Other Ambulatory Visit: Payer: Self-pay

## 2020-05-11 ENCOUNTER — Encounter: Payer: Self-pay | Admitting: Cardiovascular Disease

## 2020-05-11 VITALS — BP 136/82 | HR 61 | Ht 71.75 in | Wt 197.8 lb

## 2020-05-11 DIAGNOSIS — I2510 Atherosclerotic heart disease of native coronary artery without angina pectoris: Secondary | ICD-10-CM | POA: Diagnosis not present

## 2020-05-11 DIAGNOSIS — E7800 Pure hypercholesterolemia, unspecified: Secondary | ICD-10-CM | POA: Diagnosis not present

## 2020-05-11 NOTE — Progress Notes (Signed)
Chief Complaint  Patient presents with  . Follow-up    CAD   History of Present Illness: 74 yo male with history of CAD, kidney stones, GERD here today for cardiac follow up. Initially seen in 2013 after abnormal stress test. Stress myoview 04/02/12 with ST depression at peak exercise and inferior wall defect concerning for ischemia. Cardiac cath on 04/06/12 with moderately severe mid LAD lesion involving the diagonal branch. He was brought back for PCI on 04/11/12 and FFR of the LAD was 0.77. A Xience Expedition (2.75 mm x 23 mm) drug eluting stent was placed in the mid LAD and angioplasty was performed on the ostium of the diagonal. Recurrent chest pain November 2013 with cardiac cath 06/01/12 demonstrating stable disease with patency of LAD stent. Stress test in February 2017 in [place] and he had no ischemia.   He is here today for follow up. The patient denies any chest pain, dyspnea, palpitations, lower extremity edema, orthopnea, PND, dizziness, near syncope or syncope.    Primary Care Physician: Marin Olp, MD  Past Medical History:  Diagnosis Date  . Coronary artery disease    a. ETT 9/13: + ischemic ECG changes;  b. ETT-MV 9/13: EF 62%;  + ischemic EKG changes and + inf ischemia;  c. LHC 9/13: pLAD 30%, mLAD 70-80%, oDx 40-50% ==> FFR hemodynamically significant stenosis in mLAD ==> PCI: Xience Xpedition DES to mLAD; Cath 06/01/12   single vessel CAD w/ patent mid LAD stent and normal LV systolic function  . Fibromyalgia    muscle aches in past in both forearms, before statin even  . GERD (gastroesophageal reflux disease)   . Hyperlipidemia LDL goal < 70   . Nephrolithiasis   . Testosterone deficiency 04/28/2008   History treatment around 2009. No current treatment.       Past Surgical History:  Procedure Laterality Date  . CARDIAC CATHETERIZATION  04/06/2012; 06/01/2012  . CATARACT EXTRACTION W/ INTRAOCULAR LENS IMPLANT  08/2011   right .Left 12/2014.   Marland Kitchen CORONARY  ANGIOPLASTY WITH STENT PLACEMENT  04/11/2012   DES LAD  . CYSTOSCOPY W/ STONE MANIPULATION  2009  . INGUINAL HERNIA REPAIR  1990's   right  . KNEE ARTHROSCOPY  1990's   right  . LEFT HEART CATHETERIZATION WITH CORONARY ANGIOGRAM N/A 06/01/2012   Procedure: LEFT HEART CATHETERIZATION WITH CORONARY ANGIOGRAM;  Surgeon: Burnell Blanks, MD;  Location: [facility] CATH LAB;  Service: Cardiovascular;  Laterality: N/A;  . LYMPHADENECTOMY Bilateral 08/18/2014   Procedure: LYMPHADENECTOMY;  Surgeon: Raynelle Bring, MD;  Location: [facility] ORS;  Service: Urology;  Laterality: Bilateral;  . PERCUTANEOUS CORONARY STENT INTERVENTION (PCI-S) N/A 04/11/2012   Procedure: PERCUTANEOUS CORONARY STENT INTERVENTION (PCI-S);  Surgeon: Burnell Blanks, MD;  Location: [facility] CATH LAB;  Service: Cardiovascular;  Laterality: N/A;  . ROBOT ASSISTED LAPAROSCOPIC RADICAL PROSTATECTOMY N/A 08/18/2014   Procedure: ROBOTIC ASSISTED LAPAROSCOPIC RADICAL PROSTATECTOMY LEVEL 2;  Surgeon: Raynelle Bring, MD;  Location: [facility] ORS;  Service: Urology;  Laterality: N/A;    Current Outpatient Medications  Medication Sig Dispense Refill  . atorvastatin (LIPITOR) 40 MG tablet TAKE 1 TABLET BY MOUTH  DAILY 90 tablet 3  . cholecalciferol (VITAMIN D3) 25 MCG (1000 UNIT) tablet Take 4,000 Units by mouth daily.    . clopidogrel (PLAVIX) 75 MG tablet Take 1 tablet (75 mg total) by mouth daily. Please keep upcoming appt with Dr. Angelena Form in November before anymore refills. Thank you 90 tablet 0  . cyanocobalamin 1000 MCG tablet  Take 1,000 mcg by mouth daily.    . Melatonin 3 MG TABS Take 5 mg by mouth at bedtime. Patient taking 5 mg at bedtime    . nitroGLYCERIN (NITROSTAT) 0.4 MG SL tablet Place 1 tablet (0.4 mg total) under the tongue every 5 (five) minutes as needed for chest pain. 25 tablet 2  . pantoprazole (PROTONIX) 20 MG tablet TAKE 1 TABLET BY MOUTH IN  THE MORNING 90 tablet 3  . polyvinyl alcohol (LIQUIFILM TEARS) 1.4 % ophthalmic solution  Place 1 drop into both eyes daily as needed for dry eyes.    . sodium chloride (OCEAN) 0.65 % SOLN nasal spray Place 1 spray into both nostrils daily as needed for congestion.     No current facility-administered medications for this visit.    Allergies  Allergen Reactions  . Sulfa Antibiotics Rash    rash    Social History   Socioeconomic History  . Marital status: Married    Spouse name: Not on file  . Number of children: 2  . Years of education: Not on file  . Highest education level: Not on file  Occupational History  . Occupation: Retired  Tobacco Use  . Smoking status: Never Smoker  . Smokeless tobacco: Never Used  Substance and Sexual Activity  . Alcohol use: No  . Drug use: No  . Sexual activity: Not Currently  Other Topics Concern  . Not on file  Social History Narrative   Married (brother patient here, wife patient elsewhere). 2 children (daughter in [place], son in [place] with ticketmaster).       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 Difficulty of Paying Living Expenses: Not on file  Food Insecurity:   . Worried About Charity fundraiser in the Last Year: Not on file  . Ran Out of Food in the Last Year: Not on file  Transportation Needs:   . Lack of Transportation (Medical): Not on file  . Lack of Transportation (Non-Medical): Not on file  Physical Activity:   . Days of Exercise per Week: Not on file  . Minutes of Exercise per Session: Not on file  Stress:   . Feeling of Stress : Not on file  Social Connections:   . Frequency of Communication with Friends and Family: Not on file  . Frequency of Social Gatherings with Friends and Family: Not on file  . Attends Religious Services: Not on file  . Active Member of Clubs or Organizations: Not on file  . Attends Theatre manager Meetings: Not on file  . Marital Status: Not on file  Intimate Partner Violence:   . Fear of Current or Ex-Partner: Not on file  . Emotionally Abused: Not on file  . Physically Abused: Not on file  . Sexually Abused: Not on file    Family History  Problem Relation Age of Onset  . Diabetes Mother   . Heart attack Mother 54  . Diabetes Father   . Heart attack Father 41  . Diabetes Sister   . Arthritis Brother     Review of Systems:  As stated in the HPI and otherwise negative.   BP 136/82   Pulse 61   Ht 5' 11.75" (1.822 m)   Wt 197 lb 12.8 oz (89.7 kg)   SpO2 96%  BMI 27.01 kg/m   Physical Examination:  General: Well developed, well nourished, NAD  HEENT: OP clear, mucus membranes moist  SKIN: warm, dry. No rashes. Neuro: No focal deficits  Musculoskeletal: Muscle strength 5/5 all ext  Psychiatric: Mood and affect normal  Neck: No JVD, no carotid bruits, no thyromegaly, no lymphadenopathy.  Lungs:Clear bilaterally, no wheezes, rhonci, crackles Cardiovascular: Regular rate and rhythm. No murmurs, gallops or rubs. Abdomen:Soft. Bowel sounds present. Non-tender.  Extremities: No lower extremity edema. Pulses are 2 + in the bilateral DP/PT.  Cardiac cath 06/01/12:  Left main: No obstructive disease noted.  Left Anterior Descending Artery: Moderate caliber vessel that courses to the apex. The proximal vessel has a 30% stenosis. The mid vessel has a patent stented segment with no evidence of restenosis. The distal LAD has no obstructive disease. The diagonal branch is covered by the LAD stent and has a 10% ostial stenosis. There is excellent flow down the diagonal branch.  Intermediate Artery: Small caliber vessel with 30% stenosis.  Circumflex Artery: Large caliber, co-dominant vessel with no obstructive disease noted. There are three small caliber obtuse marginal branches that have no obstructive disease noted.  Right Coronary Artery: Small caliber,  co-dominant vessel with mild plaque in the proximal and mid segments.  Left Ventricular Angiogram: LVEF=60-65%   EKG:  EKG is ordered today. The ekg ordered today demonstrates sinus.   Recent Labs: 02/25/2020: ALT 20; BUN 18; Creat 1.29; Hemoglobin 14.8; Platelets 237; Potassium 4.5; Sodium 139   Lipid Panel    Component Value Date/Time   CHOL 144 02/25/2020 0953   TRIG 131 02/25/2020 0953   HDL 46 02/25/2020 0953   CHOLHDL 3.1 02/25/2020 0953   VLDL 27.6 01/17/2019 1136   LDLCALC 77 02/25/2020 0953   LDLDIRECT 86.0 10/16/2015 0841     Wt Readings from Last 3 Encounters:  05/11/20 197 lb 12.8 oz (89.7 kg)  02/25/20 195 lb (88.5 kg)  03/07/19 198 lb 12.8 oz (90.2 kg)     Other studies Reviewed: Additional studies/ records that were reviewed today include: . Review of the above records demonstrates:    Assessment and Plan:   1. CAD without angina: He has no chest pain. He is not on a beta blocker due to bradycardia. Continue Plavix and statin  2. HLD: Lipids are followed in primary care. LDL 77 in August 2021. Continue statin  Current medicines are reviewed at length with the patient today.  The patient does not have concerns regarding medicines.  The following changes have been made:  no change  Labs/ tests ordered today include:   Orders Placed This Encounter  Procedures  . EKG 12-Lead     Disposition:   FU with me in 12  months   Signed, Lauree Chandler, MD 05/11/2020 4:44 PM    [facility] [address], [place], [place]  [address] Phone: [phone]; Fax: [phone]

## 2020-05-11 NOTE — Patient Instructions (Signed)

## 2020-05-13 ENCOUNTER — Encounter: Payer: Self-pay | Admitting: Family Medicine

## 2020-06-18 ENCOUNTER — Encounter: Payer: Commercial Managed Care - PPO | Admitting: Family Medicine

## 2020-06-19 DIAGNOSIS — D485 Neoplasm of uncertain behavior of skin: Secondary | ICD-10-CM | POA: Diagnosis not present

## 2020-06-22 ENCOUNTER — Other Ambulatory Visit: Payer: Self-pay | Admitting: Cardiovascular Disease

## 2020-06-29 ENCOUNTER — Other Ambulatory Visit: Payer: Self-pay

## 2020-06-29 ENCOUNTER — Encounter: Payer: Self-pay | Admitting: Family Medicine

## 2020-06-30 ENCOUNTER — Other Ambulatory Visit: Payer: Self-pay

## 2020-06-30 ENCOUNTER — Encounter: Payer: Self-pay | Admitting: Family Medicine

## 2020-06-30 MED ORDER — PANTOPRAZOLE SODIUM 40 MG PO TBEC: 40.0000 mg | DELAYED_RELEASE_TABLET | Freq: Every day | ORAL | 3 refills | Status: DC

## 2020-10-05 ENCOUNTER — Encounter: Payer: Self-pay | Admitting: Family Medicine

## 2020-10-06 ENCOUNTER — Other Ambulatory Visit: Payer: Self-pay

## 2020-10-06 ENCOUNTER — Ambulatory Visit (INDEPENDENT_AMBULATORY_CARE_PROVIDER_SITE_OTHER): Payer: Commercial Managed Care - PPO | Admitting: Physician Assistant

## 2020-10-06 ENCOUNTER — Encounter: Payer: Self-pay | Admitting: Physician Assistant

## 2020-10-06 VITALS — BP 134/80 | HR 71 | Temp 97.9°F | Wt 204.2 lb

## 2020-10-06 DIAGNOSIS — R109 Unspecified abdominal pain: Secondary | ICD-10-CM | POA: Diagnosis not present

## 2020-10-06 LAB — CBC WITH DIFFERENTIAL/PLATELET
Basophils Absolute: 0.1 10*3/uL (ref 0.0–0.1)
Basophils Relative: 0.9 % (ref 0.0–3.0)
Eosinophils Absolute: 0.5 10*3/uL (ref 0.0–0.7)
Eosinophils Relative: 7.9 % — ABNORMAL HIGH (ref 0.0–5.0)
HCT: 41.7 % (ref 39.0–52.0)
Hemoglobin: 13.9 g/dL (ref 13.0–17.0)
Lymphocytes Relative: 28.9 % (ref 12.0–46.0)
Lymphs Abs: 1.7 10*3/uL (ref 0.7–4.0)
MCHC: 33.4 g/dL (ref 30.0–36.0)
MCV: 90.6 fl (ref 78.0–100.0)
Monocytes Absolute: 0.5 10*3/uL (ref 0.1–1.0)
Monocytes Relative: 9.1 % (ref 3.0–12.0)
Neutro Abs: 3.1 10*3/uL (ref 1.4–7.7)
Neutrophils Relative %: 53.2 % (ref 43.0–77.0)
Platelets: 224 10*3/uL (ref 150.0–400.0)
RBC: 4.61 Mil/uL (ref 4.22–5.81)
RDW: 13.4 % (ref 11.5–15.5)
WBC: 5.9 10*3/uL (ref 4.0–10.5)

## 2020-10-06 LAB — COMPREHENSIVE METABOLIC PANEL
ALT: 15 U/L (ref 0–53)
AST: 12 U/L (ref 0–37)
Albumin: 4.1 g/dL (ref 3.5–5.2)
Alkaline Phosphatase: 57 U/L (ref 39–117)
BUN: 22 mg/dL (ref 6–23)
CO2: 30 mEq/L (ref 19–32)
Calcium: 9 mg/dL (ref 8.4–10.5)
Chloride: 104 mEq/L (ref 96–112)
Creatinine, Ser: 1.06 mg/dL (ref 0.40–1.50)
GFR: 67.89 mL/min (ref >60.00)
Glucose, Bld: 101 mg/dL — ABNORMAL HIGH (ref 70–99)
Potassium: 4.3 mEq/L (ref 3.5–5.1)
Sodium: 140 mEq/L (ref 135–145)
Total Bilirubin: 0.7 mg/dL (ref 0.2–1.2)
Total Protein: 6.6 g/dL (ref 6.0–8.3)

## 2020-10-06 LAB — POCT URINALYSIS DIPSTICK
Blood, UA: NEGATIVE
Glucose, UA: NEGATIVE
Ketones, UA: NEGATIVE
Leukocytes, UA: NEGATIVE
Nitrite, UA: NEGATIVE
Protein, UA: NEGATIVE
Spec Grav, UA: >=1.03 — AB (ref 1.010–1.025)
Urobilinogen, UA: 0.2 E.U./dL
pH, UA: 5.5 (ref 5.0–8.0)

## 2020-10-06 LAB — LIPASE: Lipase: 26 U/L (ref 11.0–59.0)

## 2020-10-06 MED ORDER — ONDANSETRON HCL 4 MG PO TABS: 4.0000 mg | ORAL_TABLET | Freq: Three times a day (TID) | ORAL | 0 refills | Status: DC | PRN

## 2020-10-06 NOTE — Patient Instructions (Signed)
It was great to see you!  I am glad you are improving. Keep hydrating and eat bland diet. If symptoms do not continue to improve or if you have any worsening/changes, please call our office immediately.  I will be in touch with your blood work results as soon as they return.  I have sent nausea medication in case needed again.   Abdominal Pain, Adult Many things can cause belly (abdominal) pain. Most times, belly pain is not dangerous. Many cases of belly pain can be watched and treated at home. Sometimes, though, belly pain is serious. Your doctor will try to find the cause of your belly pain. Follow these instructions at home: Medicines  Take over-the-counter and prescription medicines only as told by your doctor.  Do not take medicines that help you poop (laxatives) unless told by your doctor. General instructions  Watch your belly pain for any changes.  Drink enough fluid to keep your pee (urine) pale yellow.  Keep all follow-up visits as told by your doctor. This is important.   Contact a doctor if:  Your belly pain changes or gets worse.  You are not hungry, or you lose weight without trying.  You are having trouble pooping (constipated) or have watery poop (diarrhea) for more than 2-3 days.  You have pain when you pee or poop.  Your belly pain wakes you up at night.  Your pain gets worse with meals, after eating, or with certain foods.  You are vomiting and cannot keep anything down.  You have a fever.  You have blood in your pee. Get help right away if:  Your pain does not go away as soon as your doctor says it should.  You cannot stop vomiting.  Your pain is only in areas of your belly, such as the right side or the left lower part of the belly.  You have bloody or black poop, or poop that looks like tar.  You have very bad pain, cramping, or bloating in your belly.  You have signs of not having enough fluid or water in your body (dehydration), such  as: ? Dark pee, very little pee, or no pee. ? Cracked lips. ? Dry mouth. ? Sunken eyes. ? Sleepiness. ? Weakness.  You have trouble breathing or chest pain. Summary  Many cases of belly pain can be watched and treated at home.  Watch your belly pain for any changes.  Take over-the-counter and prescription medicines only as told by your doctor.  Contact a doctor if your belly pain changes or gets worse.  Get help right away if you have very bad pain, cramping, or bloating in your belly.   Bland Diet A bland diet consists of foods that are often soft and do not have a lot of fat, fiber, or extra seasonings. Foods without fat, fiber, or seasoning are easier for the body to digest. They are also less likely to irritate your mouth, throat, stomach, and other parts of your digestive system. A bland diet is sometimes called a BRAT diet. What is my plan? Your health care provider or food and nutrition specialist (dietitian) may recommend specific changes to your diet to prevent symptoms or to treat your symptoms. These changes may include:  Eating small meals often.  Cooking food until it is soft enough to chew easily.  Chewing your food well.  Drinking fluids slowly.  Not eating foods that are very spicy, sour, or fatty.  Not eating citrus fruits, such as oranges  and grapefruit. What do I need to know about this diet?  Eat a variety of foods from the bland diet food list.  Do not follow a bland diet longer than needed.  Ask your health care provider whether you should take vitamins or supplements. What foods can I eat? Grains Hot cereals, such as cream of wheat. Rice. Bread, crackers, or tortillas made from refined white flour.   Vegetables Canned or cooked vegetables. Mashed or boiled potatoes. Fruits Bananas. Applesauce. Other types of cooked or canned fruit with the skin and seeds removed, such as canned peaches or pears.   Meats and other proteins Scrambled eggs.  Creamy peanut butter or other nut butters. Lean, well-cooked meats, such as chicken or fish. Tofu. Soups or broths.   Dairy Low-fat dairy products, such as milk, cottage cheese, or yogurt. Beverages Water. Herbal tea. Apple juice.   Fats and oils Mild salad dressings. Canola or olive oil. Sweets and desserts Pudding. Custard. Fruit gelatin. Ice cream. The items listed above may not be a complete list of recommended foods and beverages. Contact a dietitian for more options. What foods are not recommended? Grains Whole grain breads and cereals. Vegetables Raw vegetables. Fruits Raw fruits, especially citrus, berries, or dried fruits. Dairy Whole fat dairy foods. Beverages Caffeinated drinks. Alcohol. Seasonings and condiments Strongly flavored seasonings or condiments. Hot sauce. Salsa. Other foods Spicy foods. Fried foods. Sour foods, such as pickled or fermented foods. Foods with high sugar content. Foods high in fiber. The items listed above may not be a complete list of foods and beverages to avoid. Contact a dietitian for more information. Summary  A bland diet consists of foods that are often soft and do not have a lot of fat, fiber, or extra seasonings.  Foods without fat, fiber, or seasoning are easier for the body to digest.  Check with your health care provider to see how long you should follow this diet plan. It is not meant to be followed for long periods. This information is not intended to replace advice given to you by your health care provider. Make sure you discuss any questions you have with your health care provider. Document Revised: 07/26/2017 Document Reviewed: 07/26/2017 Elsevier Patient Education  2021 [employer].

## 2020-10-06 NOTE — Progress Notes (Signed)
Richard Mahoney is a 75 y.o. male here for an acute issue    History of Present Illness:   Chief Complaint  Patient presents with  . Abdominal Pain    Started on saturday, started with diarrhea then began throwing up. Into Sunday afternoon, diarrhea was gone. Used Pepto. Sharp stomach pain, bloated.    HPI  Abdominal pain On Friday went to a luncheon around 1230p and is unsure if he may have ate something not good. Ate some coleslaw and ranch dressing that were not cooled. Also ate dinner on Friday around 6:30p.  Saturday AM -- woke up with nausea, diarrhea with strong stomach pains. Had vomiting and belching with sulfur-burps. Vomiting was pretty forceful and got stuck in his throat a few times. Stomach pains were similar to kidney stone pain in the past, lingered into the night. Did take a double dose of pepto bismol and had a black stool he thinks because of this. Has not had any bleeding that he has noticed.  Yesterday he ate some potato soup and did well with tolerating this. Still has bloating and soreness.  Yesterday morning had a yellowish stool.  Today feels more improved. Has not eaten yet today. Has abdominal soreness in lower abdomen and feels bloated. Diarrhea, n/v have all subsided.   Denies: chest pain, SOB, dysuria   Past Medical History:  Diagnosis Date  . Coronary artery disease    a. ETT 9/13: + ischemic ECG changes;  b. ETT-MV 9/13: EF 62%;  + ischemic EKG changes and + inf ischemia;  c. LHC 9/13: pLAD 30%, mLAD 70-80%, oDx 40-50% ==> FFR hemodynamically significant stenosis in mLAD ==> PCI: Xience Xpedition DES to mLAD; Cath 06/01/12   single vessel CAD w/ patent mid LAD stent and normal LV systolic function  . Fibromyalgia    muscle aches in past in both forearms, before statin even  . GERD (gastroesophageal reflux disease)   . Hyperlipidemia LDL goal < 70   . Nephrolithiasis   . Testosterone deficiency 04/28/2008   History treatment around 2009. No current  treatment.        Social History   Tobacco Use  . Smoking status: Never Smoker  . Smokeless tobacco: Never Used  Substance Use Topics  . Alcohol use: No  . Drug use: No    Past Surgical History:  Procedure Laterality Date  . CARDIAC CATHETERIZATION  04/06/2012; 06/01/2012  . CATARACT EXTRACTION W/ INTRAOCULAR LENS IMPLANT  08/2011   right .Left 12/2014.   Marland Kitchen CORONARY ANGIOPLASTY WITH STENT PLACEMENT  04/11/2012   DES LAD  . CYSTOSCOPY W/ STONE MANIPULATION  2009  . INGUINAL HERNIA REPAIR  1990's   right  . KNEE ARTHROSCOPY  1990's   right  . LEFT HEART CATHETERIZATION WITH CORONARY ANGIOGRAM N/A 06/01/2012   Procedure: LEFT HEART CATHETERIZATION WITH CORONARY ANGIOGRAM;  Surgeon: Burnell Blanks, MD;  Location: [facility] CATH LAB;  Service: Cardiovascular;  Laterality: N/A;  . LYMPHADENECTOMY Bilateral 08/18/2014   Procedure: LYMPHADENECTOMY;  Surgeon: Raynelle Bring, MD;  Location: [facility];  Service: Urology;  Laterality: Bilateral;  . PERCUTANEOUS CORONARY STENT INTERVENTION (PCI-S) N/A 04/11/2012   Procedure: PERCUTANEOUS CORONARY STENT INTERVENTION (PCI-S);  Surgeon: Burnell Blanks, MD;  Location: [facility] CATH LAB;  Service: Cardiovascular;  Laterality: N/A;  . ROBOT ASSISTED LAPAROSCOPIC RADICAL PROSTATECTOMY N/A 08/18/2014   Procedure: ROBOTIC ASSISTED LAPAROSCOPIC RADICAL PROSTATECTOMY LEVEL 2;  Surgeon: Raynelle Bring, MD;  Location: [facility] OR[facility];  Service: Urology;  Laterality: N/A;  Family History  Problem Relation Age of Onset  . Diabetes Mother   . Heart attack Mother 76  . Diabetes Father   . Heart attack Father 41  . Diabetes Sister   . Arthritis Brother     Allergies  Allergen Reactions  . Sulfa Antibiotics Rash    rash    Current Medications:   Current Outpatient Medications:  .  atorvastatin (LIPITOR) 40 MG tablet, TAKE 1 TABLET BY MOUTH  DAILY, Disp: 90 tablet, Rfl: 3 .  cholecalciferol (VITAMIN D3) 25 MCG (1000 UNIT) tablet, Take 4,000 Units by mouth  daily., Disp: , Rfl:  .  clopidogrel (PLAVIX) 75 MG tablet, TAKE 1 TABLET BY MOUTH  DAILY, Disp: 90 tablet, Rfl: 3 .  cyanocobalamin 1000 MCG tablet, Take 1,000 mcg by mouth daily., Disp: , Rfl:  .  Melatonin 3 MG TABS, Take 5 mg by mouth at bedtime. Patient taking 5 mg at bedtime, Disp: , Rfl:  .  nitroGLYCERIN (NITROSTAT) 0.4 MG SL tablet, Place 1 tablet (0.4 mg total) under the tongue every 5 (five) minutes as needed for chest pain., Disp: 25 tablet, Rfl: 2 .  ondansetron (ZOFRAN) 4 MG tablet, Take 1 tablet (4 mg total) by mouth every 8 (eight) hours as needed for nausea or vomiting., Disp: 20 tablet, Rfl: 0 .  pantoprazole (PROTONIX) 40 MG tablet, Take 1 tablet (40 mg total) by mouth daily., Disp: 90 tablet, Rfl: 3 .  polyvinyl alcohol (LIQUIFILM TEARS) 1.4 % ophthalmic solution, Place 1 drop into both eyes daily as needed for dry eyes., Disp: , Rfl:  .  sodium chloride (OCEAN) 0.65 % SOLN nasal spray, Place 1 spray into both nostrils daily as needed for congestion., Disp: , Rfl:    Review of Systems:   ROS Negative unless otherwise specified per HPI.  Vitals:   Vitals:   10/06/20 0940  BP: 134/80  Pulse: 71  Temp: 97.9 F (36.6 C)  TempSrc: Temporal  SpO2: 95%  Weight: 204 lb 3.2 oz (92.6 kg)     Body mass index is 27.89 kg/m.  Physical Exam:   Physical Exam Vitals and nursing note reviewed.  Constitutional:      General: He is not in acute distress.    Appearance: He is well-developed. He is not ill-appearing or toxic-appearing.  Cardiovascular:     Rate and Rhythm: Normal rate and regular rhythm.     Pulses: Normal pulses.     Heart sounds: Normal heart sounds, S1 normal and S2 normal.     Comments: No LE edema Pulmonary:     Effort: Pulmonary effort is normal.     Breath sounds: Normal breath sounds.  Abdominal:     General: Abdomen is flat. Bowel sounds are increased.     Palpations: Abdomen is soft.     Tenderness: There is no abdominal tenderness. There is  no right CVA tenderness, left CVA tenderness, guarding or rebound.  Skin:    General: Skin is warm and dry.  Neurological:     Mental Status: He is alert.     GCS: GCS eye subscore is 4. GCS verbal subscore is 5. GCS motor subscore is 6.  Psychiatric:        Speech: Speech normal.        Behavior: Behavior normal. Behavior is cooperative.     Results for orders placed or performed in visit on 10/06/20  POCT Urinalysis Dipstick  Result Value Ref Range   Color, UA yellow  Clarity, UA clear    Glucose, UA Negative Negative   Bilirubin, UA 1+    Ketones, UA negative    Spec Grav, UA >=1.030 (A) 1.010 - 1.025   Blood, UA negative    pH, UA 5.5 5.0 - 8.0   Protein, UA Negative Negative   Urobilinogen, UA 0.2 0.2 or 1.0 E.U./dL   Nitrite, UA negative    Leukocytes, UA Negative Negative   Appearance     Odor      Assessment and Plan:   Kalman was seen today for abdominal pain.  Diagnoses and all orders for this visit:  Abdominal pain, unspecified abdominal location -     POCT Urinalysis Dipstick -     CBC with Differential/Platelet -     Comprehensive metabolic panel -     Lipase  Other orders -     ondansetron (ZOFRAN) 4 MG tablet; Take 1 tablet (4 mg total) by mouth every 8 (eight) hours as needed for nausea or vomiting.   Suspect possible viral illness. No evidence of acute abdomen or peritoneal signs on my exam. Fortunately symptoms are overall improving with time. Push fluids and continue bland diet. Zofran prn provided. Update blood work. We opted to not pursue imaging today. If any worsening symptoms, low threshold to pursue this. ER precautions advised.  Time spent with patient today was 25 minutes which consisted of chart review, discussing diagnosis, work up, treatment answering questions and documentation.   Inda Coke, PA-C

## 2020-10-25 ENCOUNTER — Other Ambulatory Visit: Payer: Self-pay | Admitting: Family Medicine

## 2020-11-11 NOTE — Telephone Encounter (Signed)
Called patient in regards to MyChart message. He states that he has been having some discomfort on the left side of his chest and in his shoulder for a couple of weeks now. He states that it does not increase with activity and there is nothing that brings it on. He states that it is intermittent. He states that when he does press on his left shoulder area that it does feel a bit sore. He states that the discomfort started prior to doing yard work but did not worsen with yard work. He denies any SOB, N/V, dizziness, or swelling. No tingling/numbness in left arm.  Patient also reports he has been having some stomach issues recently with GERD. He also has fibromyalgia that causes him issues at times.  States that blood pressure and heart rate have been good.  Advised patient that I would let Dr. Angelena Form know and we will call him back with any recommendations. He will continue to monitor and let us know if he develops any new or worsening symptoms.

## 2020-12-17 MED ORDER — NITROGLYCERIN 0.4 MG SL SUBL: 0.4000 mg | SUBLINGUAL_TABLET | SUBLINGUAL | 3 refills | Status: DC | PRN

## 2020-12-23 ENCOUNTER — Other Ambulatory Visit: Payer: Self-pay | Admitting: Cardiovascular Disease

## 2020-12-23 MED ORDER — ALPRAZOLAM 0.5 MG PO TABS: 0.5000 mg | ORAL_TABLET | Freq: Every evening | ORAL | 0 refills | Status: DC | PRN

## 2021-02-24 NOTE — Progress Notes (Signed)
Phone: [phone]   Subjective:  Patient presents today for their annual physical. Chief complaint-noted.   See problem oriented charting- ROS- full  review of systems was completed and negative  except for: trouble sleeping  The following were reviewed and entered/updated in epic: Past Medical History:  Diagnosis Date   Coronary artery disease    a. ETT 9/13: + ischemic ECG changes;  b. ETT-MV 9/13: EF 62%;  + ischemic EKG changes and + inf ischemia;  c. LHC 9/13: pLAD 30%, mLAD 70-80%, oDx 40-50% ==> FFR hemodynamically significant stenosis in mLAD ==> PCI: Xience Xpedition DES to mLAD; Cath 06/01/12   single vessel CAD w/ patent mid LAD stent and normal LV systolic function   Fibromyalgia    muscle aches in past in both forearms, before statin even   GERD (gastroesophageal reflux disease)    Hyperlipidemia LDL goal < 70    Nephrolithiasis    Testosterone deficiency 04/28/2008   History treatment around 2009. No current treatment.      Patient Active Problem List   Diagnosis Date Noted   History of prostate cancer 08/18/2014    Priority: High   Coronary atherosclerosis of native coronary artery 04/12/2012    Priority: High   Vitamin B12 deficiency 10/31/2017    Priority: Medium   GERD (gastroesophageal reflux disease) 03/17/2015    Priority: Medium   Hyperlipidemia 04/25/2012    Priority: Medium   Insomnia 04/22/2010    Priority: Low   NEPHROLITHIASIS, HX OF 03/26/2008    Priority: Low   Past Surgical History:  Procedure Laterality Date   CARDIAC CATHETERIZATION  04/06/2012; 06/01/2012   CATARACT EXTRACTION W/ INTRAOCULAR LENS IMPLANT  08/2011   right .Left 12/2014.    CORONARY ANGIOPLASTY WITH STENT PLACEMENT  04/11/2012   DES LAD   CYSTOSCOPY W/ STONE MANIPULATION  2009   INGUINAL HERNIA REPAIR  1990's   right   KNEE ARTHROSCOPY  1990's   right   LEFT HEART CATHETERIZATION WITH CORONARY ANGIOGRAM N/A 06/01/2012   Procedure: LEFT HEART CATHETERIZATION WITH  CORONARY ANGIOGRAM;  Surgeon: Burnell Blanks, MD;  Location: [facility] CATH LAB;  Service: Cardiovascular;  Laterality: N/A;   LYMPHADENECTOMY Bilateral 08/18/2014   Procedure: LYMPHADENECTOMY;  Surgeon: Raynelle Bring, MD;  Location: [facility] ORS;  Service: Urology;  Laterality: Bilateral;   PERCUTANEOUS CORONARY STENT INTERVENTION (PCI-S) N/A 04/11/2012   Procedure: PERCUTANEOUS CORONARY STENT INTERVENTION (PCI-S);  Surgeon: Burnell Blanks, MD;  Location: [facility] CATH LAB;  Service: Cardiovascular;  Laterality: N/A;   ROBOT ASSISTED LAPAROSCOPIC RADICAL PROSTATECTOMY N/A 08/18/2014   Procedure: ROBOTIC ASSISTED LAPAROSCOPIC RADICAL PROSTATECTOMY LEVEL 2;  Surgeon: Raynelle Bring, MD;  Location: [facility] OR[facility];  Service: Urology;  Laterality: N/A;    Family History  Problem Relation Age of Onset   Diabetes Mother    Heart attack Mother 60   Diabetes Father    Heart attack Father 41   Diabetes Sister    Arthritis Brother     Medications- reviewed and updated Current Outpatient Medications  Medication Sig Dispense Refill   ALPRAZolam (XANAX) 0.5 MG tablet Take 1 tablet (0.5 mg total) by mouth at bedtime as needed for anxiety. 90 tablet 0   atorvastatin (LIPITOR) 40 MG tablet TAKE 1 TABLET BY MOUTH  DAILY 90 tablet 3   cholecalciferol (VITAMIN D3) 25 MCG (1000 UNIT) tablet Take 4,000 Units by mouth daily.     clopidogrel (PLAVIX) 75 MG tablet TAKE 1 TABLET BY MOUTH  DAILY 90 tablet 3  cyanocobalamin 1000 MCG tablet Take 1,000 mcg by mouth daily.     Melatonin 3 MG TABS Take 5 mg by mouth at bedtime. Patient taking 5 mg at bedtime     nitroGLYCERIN (NITROSTAT) 0.4 MG SL tablet Place 1 tablet (0.4 mg total) under the tongue every 5 (five) minutes as needed for chest pain. 25 tablet 3   pantoprazole (PROTONIX) 40 MG tablet Take 1 tablet (40 mg total) by mouth daily. 90 tablet 3   polyvinyl alcohol (LIQUIFILM TEARS) 1.4 % ophthalmic solution Place 1 drop into both eyes daily as needed for dry eyes.      sodium chloride (OCEAN) 0.65 % SOLN nasal spray Place 1 spray into both nostrils daily as needed for congestion.     No current facility-administered medications for this visit.    Allergies-reviewed and updated Allergies  Allergen Reactions   Sulfa Antibiotics Rash    rash    Social History   Social History Narrative   Married (brother patient here, wife patient elsewhere). 2 children (daughter in [place], son in [place] with ticketmaster).       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 Oropharynx normal Neck: no thyromegaly CV: RRR no murmurs rubs or gallops Lungs: CTAB no crackles, wheeze, rhonchi Abdomen: soft/nontender/nondistended/normal bowel sounds. No rebound or guarding.  Ext: no edema Skin: warm, dry Neuro: grossly normal, moves all extremities, PERRLA    Assessment and Plan  75 y.o. male presenting for annual physical.  Health Maintenance counseling: 1. Anticipatory guidance: Patient counseled regarding regular dental exams -q6 months, eye exams -- yearly,  avoiding smoking and second hand smoke , limiting alcohol to 2 beverages per day -1-2 times per month as a beer, no illicit drugs 2. Risk factor reduction:  Advised patient of need for regular exercise and diet rich and fruits and vegetables to reduce risk of heart attack and stroke. Exercise-last year walking 35 minutes daily for about 2 miles, has kept this up. Diet-weight down 4 pounds from last physical. Eating reasonably healthy diet  Wt Readings from Last 3 Encounters:  03/01/21 191 lb (86.6 kg)  10/06/20 204 lb 3.2 oz (92.6 kg)  05/11/20 197 lb 12.8 oz (89.7 kg)  3.  Immunizations/screenings/ancillary studies DISCUSSED:  -Shingrix vaccination- checking with pharmacy today  -COVID-19 vaccination #4- plans to wait on new variant specific vaccine . Also had covid 2021.  -Flu vaccination (last one 04/15/2020)- will get in fall   Immunization History  Administered Date(s) Administered   Fluad Quad(high Dose 65+) 04/04/2019, 04/15/2020   Influenza Split 05/30/2011, 05/15/2012   Influenza Whole 03/26/2008, 04/22/2010   Influenza, High Dose Seasonal PF 04/26/2013, 04/15/2014, 04/28/2016, 03/27/2018   Influenza,inj,Quad PF,6+ Mos 03/17/2015   Influenza-Unspecified 04/13/2017   Moderna Sars-Covid-2 Vaccination 07/31/2019, 08/28/2019, 06/26/2020   Pneumococcal Conjugate-13 04/15/2014   Pneumococcal Polysaccharide-23 04/22/2010   Td 07/12/1995, 03/26/2008   Tdap 10/04/2016   Zoster, Live 03/26/2008  4. Prostate cancer screening-  followed up regularly with urology due to prostate cancer history status post prostatectomy.  Patient reportsundetectable PSA with urology in the past- has not been  checked recently- we will check PSA with labs and encouraged follow up Lab Results  Component Value Date   PSA 0.015ng 11/21/2018   PSA 0.015 11/08/2017   PSA undetectable 10/25/2016   5. Colon cancer screening - September 26, 2017 at [facility]- was told no further follow-up.  6. Skin cancer screening-saw Dr. Loyal Jacobson in last year and got good report.  advised regular sunscreen use. Denies worrisome, changing, or new skin lesions.  7. never smoker 8. STD screening - opts out of monogamous  Status of chronic or acute concerns   #CAD-last visit with cardiology 05/11/2020 and scheduled for this year #Hyperlipidemia-LDL goal under 87 S: Medications: Atorvastatin 40 mg daily- Also compliant with Plavix-not on aspirin.  -has never had to use nitroglycerin.    A/P: Follows with Dr. Angelena Form LDL goal under 70-update lipids today-hopefully now at goal  #GERD S:  Medication: Protonix 40 mg daily- breakthrough on '20mg'$ .  Have discussed Pepcid in the past but does not like twice daily dosing. A/P: Stable. Continue current medications. Did not tolerate reduction attempts   #Insomnia-melatonin 5 mg helpful in the past. He states has been very active recently- having spells of going ot bed and not being able to go to sleep like last night. Feels exhausted next morning. He states 2-3 days a month. 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  -Patient also with some anxiety and was prescribed alprazolam by cardiology at last visit- advised not to drive 8 hours after this- he is hesitant to take this more often - we discussed possible trazodone- he is going to read about this and let me know if he wants to try this.  - prefers mail order #90 . I advised try half tablet at first    # B12 deficiency S: Current treatment/medication (oral vs. IM):  1000 mcg Oral over-the-counter A/P: hopefully stable- update b12 today. Continue current meds for now   #Vitamin D deficiency S: Medication: 4000 units daily A/P: hopefully stable- update vitamin D  today. Continue current meds for now   #Polymyalgia rheumatica history-  remained off prednisone and methotrexate for now 2 years-followed with Dr. Dossie Der previously- available if needed    Recommended follow up: No follow-ups on file. Future Appointments  Date Time Provider [place]  07/21/2021 10:20 AM Burnell Blanks, MD [facility]  03/17/2022 11:45 AM [facility]   Lab/Order associations: fasting   ICD-10-CM   1. Preventative health care  Z00.00     2. Hyperlipidemia, unspecified hyperlipidemia type  E78.5     3. Atherosclerosis of native coronary artery of native heart without angina pectoris  I25.10     4. Gastroesophageal reflux disease without esophagitis  K21.9      No orders of  the defined types were placed in this encounter.  I,Jada Bradford,acting as a scribe for Garret Reddish, MD.,have documented all relevant documentation on the behalf of Garret Reddish, MD,as directed by  Garret Reddish, MD while in the presence of Garret Reddish, MD.   I, Garret Reddish, MD, have reviewed all documentation for this visit. The documentation on 03/01/21 for the exam, diagnosis, procedures, and orders are all accurate and complete.   Return precautions advised.  Garret Reddish, MD

## 2021-02-26 ENCOUNTER — Ambulatory Visit (INDEPENDENT_AMBULATORY_CARE_PROVIDER_SITE_OTHER): Payer: Commercial Managed Care - PPO

## 2021-02-26 ENCOUNTER — Encounter: Payer: Commercial Managed Care - PPO | Admitting: Family Medicine

## 2021-02-26 DIAGNOSIS — Z0000 Encounter for general adult medical examination without abnormal findings: Secondary | ICD-10-CM | POA: Diagnosis not present

## 2021-02-26 NOTE — Progress Notes (Signed)
Virtual Visit via Telephone Note  I connected with  Richard Mahoney on 02/26/21 at 11:45 AM EDT by telephone and verified that I am speaking with the correct person using two identifiers.  Medicare Annual Wellness visit completed telephonically due to Covid-19 pandemic.   Persons participating in this call: This Health Coach and this patient.   Location: Patient: Home Provider: Office   I discussed the limitations, risks, security and privacy concerns of performing an evaluation and management service by telephone and the availability of in person appointments. The patient expressed understanding and agreed to proceed.  Unable to perform video visit due to video visit attempted and failed and/or patient does not have video capability.   Some vital signs may be absent or patient reported.   Willette Brace, LPN   Subjective:   Richard Mahoney is a 75 y.o. male who presents for Medicare Annual/Subsequent preventive examination.  Review of Systems     Cardiac Risk Factors include: advanced age (>64mn, >>71women);dyslipidemia;male gender     Objective:    There were no vitals filed for this visit. There is no height or weight on file to calculate BMI.  Advanced Directives 02/26/2021 11/04/2016 08/18/2014 08/13/2014 06/01/2012 06/01/2012 04/11/2012  Does Patient Have a Medical Advance Directive? Yes Yes Yes Yes Patient has advance directive, copy not in chart Patient has advance directive, copy in chart;Patient has advance directive, copy not in chart Patient has advance directive, copy in chart  Type of Advance Directive Healthcare Power of [place] will - Healthcare Power of [place] will  Does patient want to make changes to medical advance directive? - - No - Patient declined No - Patient declined - - -  Copy of [place] Chart? Yes - validated  most recent copy scanned in chart (See row information) - (No Data) Yes Copy requested from other (Comment) Copy requested from family -  Pre-existing out of facility DNR order (yellow form or pink MOST form) - - - - - No -    Current Medications (verified) Outpatient Encounter Medications as of 02/26/2021  Medication Sig   ALPRAZolam (XANAX) 0.5 MG tablet Take 1 tablet (0.5 mg total) by mouth at bedtime as needed for anxiety.   atorvastatin (LIPITOR) 40 MG tablet TAKE 1 TABLET BY MOUTH  DAILY   cholecalciferol (VITAMIN D3) 25 MCG (1000 UNIT) tablet Take 4,000 Units by mouth daily.   clopidogrel (PLAVIX) 75 MG tablet TAKE 1 TABLET BY MOUTH  DAILY   cyanocobalamin 1000 MCG tablet Take 1,000 mcg by mouth daily.   Melatonin 3 MG TABS Take 5 mg by mouth at bedtime. Patient taking 5 mg at bedtime   pantoprazole (PROTONIX) 40 MG tablet Take 1 tablet (40 mg total) by mouth daily.   polyvinyl alcohol (LIQUIFILM TEARS) 1.4 % ophthalmic solution Place 1 drop into both eyes daily as needed for dry eyes.   sodium chloride (OCEAN) 0.65 % SOLN nasal spray Place 1 spray into both nostrils daily as needed for congestion.   nitroGLYCERIN (NITROSTAT) 0.4 MG SL tablet Place 1 tablet (0.4 mg total) under the tongue every 5 (five) minutes as needed for chest pain. (Patient not taking: Reported on 02/26/2021)   [DISCONTINUED] ondansetron (ZOFRAN) 4 MG tablet Take 1 tablet (4 mg total) by mouth every 8 (eight) hours as needed for nausea or vomiting. (Patient not taking: Reported on 02/26/2021)   No  facility-administered encounter medications on file as of 02/26/2021.    Allergies (verified) Sulfa antibiotics   History: Past Medical History:  Diagnosis Date   Coronary artery disease    a. ETT 9/13: + ischemic ECG changes;  b. ETT-MV 9/13: EF 62%;  + ischemic EKG changes and + inf ischemia;  c. LHC 9/13: pLAD 30%, mLAD 70-80%, oDx 40-50% ==> FFR hemodynamically significant stenosis in mLAD ==> PCI: Xience Xpedition  DES to mLAD; Cath 06/01/12   single vessel CAD w/ patent mid LAD stent and normal LV systolic function   Fibromyalgia    muscle aches in past in both forearms, before statin even   GERD (gastroesophageal reflux disease)    Hyperlipidemia LDL goal < 70    Nephrolithiasis    Testosterone deficiency 04/28/2008   History treatment around 2009. No current treatment.      Past Surgical History:  Procedure Laterality Date   CARDIAC CATHETERIZATION  04/06/2012; 06/01/2012   CATARACT EXTRACTION W/ INTRAOCULAR LENS IMPLANT  08/2011   right .Left 12/2014.    CORONARY ANGIOPLASTY WITH STENT PLACEMENT  04/11/2012   DES LAD   CYSTOSCOPY W/ STONE MANIPULATION  2009   INGUINAL HERNIA REPAIR  1990's   right   KNEE ARTHROSCOPY  1990's   right   LEFT HEART CATHETERIZATION WITH CORONARY ANGIOGRAM N/A 06/01/2012   Procedure: LEFT HEART CATHETERIZATION WITH CORONARY ANGIOGRAM;  Surgeon: Burnell Blanks, MD;  Location[facility] CATH LAB;  Service: Cardiovascular;  Laterality: N/A;   LYMPHADENECTOMY Bilateral 08/18/2014   Procedure: LYMPHADENECTOMY;  Surgeon: Raynelle Bring, MD;  Location: [facility] ORS;  Service: Urology;  Laterality: Bilateral;   PERCUTANEOUS CORONARY STENT INTERVENTION (PCI-S) N/A 04/11/2012   Procedure: PERCUTANEOUS CORONARY STENT INTERVENTION (PCI-S);  Surgeon: Burnell Blanks, MD;  Location[facility] LAB;  Service: Cardiovascular;  Laterality: N/A;   ROBOT ASSISTED LAPAROSCOPIC RADICAL PROSTATECTOMY N/A 08/18/2014   Procedure: ROBOTIC ASSISTED LAPAROSCOPIC RADICAL PROSTATECTOMY LEVEL 2;  Surgeon: Raynelle Bring, MD;  Location: [facility] ORS;  Service: Urology;  Laterality: N/A;   Family History  Problem Relation Age of Onset   Diabetes Mother    Heart attack Mother 18   Diabetes Father    Heart attack Father 27   Diabetes Sister    Arthritis Brother    Social History   Socioeconomic History   Marital status: Married    Spouse name: Not on file   Number of children: 2   Years of education:  Not on file   Highest education level: Not on file  Occupational History   Occupation: Retired  Tobacco Use   Smoking status: Never   Smokeless tobacco: Never  Substance and Sexual Activity   Alcohol use: No   Drug use: No   Sexual activity: Not Currently  Other Topics Concern   Not on file  Social History Narrative   Married (brother patient here, wife patient elsewhere). 2 children (daughter in [place], son in [place] with ticketmaster).       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)      Hobbies: golf, work in yard, walking with wife   Social Determinants of Radio broadcast assistant Strain: Low Risk    Difficulty of Paying Living Expenses: Not hard at [employer] Insecurity: No Food Insecurity   Worried About Charity fundraiser in the Last Year: Never true   Arboriculturist in the Last Year: Never true  Transportation Needs: No Transportation  Needs   Lack of Transportation (Medical): No   Lack of Transportation (Non-Medical): No  Physical Activity: Sufficiently Active   Days of Exercise per Week: 7 days   Minutes of Exercise per Session: 30 min  Stress: No Stress Concern Present   Feeling of Stress : Not at all  Social Connections: Socially Integrated   Frequency of Communication with Friends and Family: More than three times a week   Frequency of Social Gatherings with Friends and Family: More than three times a week   Attends Religious Services: More than 4 times per year   Active Member of [employer] or Organizations: Yes   Attends Archivist Meetings: 1 to 4 times per year   Marital Status: Married    Tobacco Counseling Counseling given: Not Answered   Clinical Intake:  Pre-visit preparation completed: Yes  Pain : No/denies pain     BMI - recorded: 27.89 Nutritional Status: BMI 25 -29 Overweight Nutritional Risks: None Diabetes: No  How often do you need to have someone help you when  you read instructions, pamphlets, or other written materials from your doctor or pharmacy?: 1 - Never  Diabetic?No  Interpreter Needed?: No  Information entered by :: Charlott Rakes, LPN   Activities of Daily Living In your present state of health, do you have any difficulty performing the following activities: 02/26/2021  Hearing? N  Vision? N  Difficulty concentrating or making decisions? N  Walking or climbing stairs? N  Dressing or bathing? N  Doing errands, shopping? N  Preparing Food and eating ? N  Using the Toilet? N  In the past six months, have you accidently leaked urine? N  Do you have problems with loss of bowel control? N  Managing your Medications? N  Managing your Finances? N  Housekeeping or managing your Housekeeping? N  Some recent data might be hidden    Patient Care Team: Marin Olp, MD as PCP - General (Family Medicine) Burnell Blanks, MD as PCP - Cardiology (Cardiology) Raynelle Bring, MD as Consulting Physician (Urology) Valinda Party, MD as Consulting Physician (Rheumatology)  Indicate any recent Medical Services you may have received from other than [facility] providers in the past year (date may be approximate).     Assessment:   This is a routine wellness examination for Richard Mahoney.  Hearing/Vision screen Hearing Screening - Comments:: Denies any hearing  issues  Vision Screening - Comments:: Pt follows up with Dr Ovid Curd for annual eye exams   Dietary issues and exercise activities discussed: Current Exercise Habits: Home exercise routine, Type of exercise: treadmill, Time (Minutes): 30, Frequency (Times/Week): 7, Weekly Exercise (Minutes/Week): 210   Goals Addressed             This Visit's Progress    Patient Stated       Maintain exercise        Depression Screen PHQ 2/9 Scores 02/26/2021 02/25/2020 01/17/2019 01/17/2019 11/04/2016 11/04/2016 10/27/2015  PHQ - 2 Score 0 0 0 0 0 0 0  PHQ- 9 Score - 0 - - - - -    Fall  Risk Fall Risk  02/26/2021 10/06/2020 02/25/2020 01/17/2019 11/04/2016  Falls in the past year? 0 0 0 0 No  Number falls in past yr: 0 0 0 - -  Injury with Fall? 0 0 0 - -  Risk for fall due to : Impaired vision - - - -  Follow up Falls prevention discussed - - - -  FALL RISK PREVENTION PERTAINING TO THE HOME:  Any stairs in or around the home? Yes  If so, are there any without handrails? No  Home free of loose throw rugs in walkways, pet beds, electrical cords, etc? Yes  Adequate lighting in your home to reduce risk of falls? Yes   ASSISTIVE DEVICES UTILIZED TO PREVENT FALLS:  Life alert? No  Use of a cane, walker or w/c? No  Grab bars in the bathroom? Yes  Shower chair or bench in shower? Yes  Elevated toilet seat or a handicapped toilet? No   TIMED UP AND GO:  Was the test performed? No .   Cognitive Function: MMSE - Mini Mental State Exam 11/04/2016  Not completed: (No Data)     6CIT Screen 02/26/2021  What Year? 0 points  What month? 0 points  What time? 0 points  Count back from 20 0 points  Months in reverse 0 points  Repeat phrase 0 points  Total Score 0    Immunizations Immunization History  Administered Date(s) Administered   Fluad Quad(high Dose 65+) 04/04/2019, 04/15/2020   Influenza Split 05/30/2011, 05/15/2012   Influenza Whole 03/26/2008, 04/22/2010   Influenza, High Dose Seasonal PF 04/26/2013, 04/15/2014, 04/28/2016, 03/27/2018   Influenza,inj,Quad PF,6+ Mos 03/17/2015   Influenza-Unspecified 04/13/2017   Moderna Sars-Covid-2 Vaccination 07/31/2019, 08/28/2019, 06/26/2020   Pneumococcal Conjugate-13 04/15/2014   Pneumococcal Polysaccharide-23 04/22/2010   Td 07/12/1995, 03/26/2008   Tdap 10/04/2016   Zoster, Live 03/26/2008    TDAP status: Up to date  Flu Vaccine status: Due, Education has been provided regarding the importance of this vaccine. Advised may receive this vaccine at local pharmacy or Health Dept. Aware to provide a copy of the  vaccination record if obtained from local pharmacy or Health Dept. Verbalized acceptance and understanding.  Pneumococcal vaccine status: Up to date  Covid-19 vaccine status: Completed vaccines  Qualifies for Shingles Vaccine? Yes   Zostavax completed Yes   Shingrix Completed?: No.    Education has been provided regarding the importance of this vaccine. Patient has been advised to call insurance company to determine out of pocket expense if they have not yet received this vaccine. Advised may also receive vaccine at local pharmacy or Health Dept. Verbalized acceptance and understanding.  Screening Tests Health Maintenance  Topic Date Due   Zoster Vaccines- Shingrix (1 of 2) Never done   COVID-19 Vaccine (4 - Booster for Moderna series) 09/24/2020   INFLUENZA VACCINE  02/08/2021   Hepatitis C Screening  04/12/2060 (Originally 09/01/1963)   TETANUS/TDAP  10/05/2026   PNA vac Low Risk Adult  Completed   HPV VACCINES  Aged Out    Health Maintenance  Health Maintenance Due  Topic Date Due   Zoster Vaccines- Shingrix (1 of 2) Never done   COVID-19 Vaccine (4 - Booster for Moderna series) 09/24/2020   INFLUENZA VACCINE  02/08/2021    Colorectal cancer screening: No longer required.     Additional Screening:  Hepatitis C Screening:  Postponed   Vision Screening: Recommended annual ophthalmology exams for early detection of glaucoma and other disorders of the eye. Is the patient up to date with their annual eye exam?  Yes  Who is the provider or what is the name of the office in which the patient attends annual eye exams? Dr Ovid Curd  If pt is not established with a provider, would they like to be referred to a provider to establish care? No .   Dental Screening: Recommended annual dental  exams for proper oral hygiene  Community Resource Referral / Chronic Care Management: CRR required this visit?  No   CCM required this visit?  No      Plan:     I have personally  reviewed and noted the following in the patient's chart:   Medical and social history Use of alcohol, tobacco or illicit drugs  Current medications and supplements including opioid prescriptions. Patient is not currently taking opioid prescriptions. Functional ability and status Nutritional status Physical activity Advanced directives List of other physicians Hospitalizations, surgeries, and ER visits in previous 12 months Vitals Screenings to include cognitive, depression, and falls Referrals and appointments  In addition, I have reviewed and discussed with patient certain preventive protocols, quality metrics, and best practice recommendations. A written personalized care plan for preventive services as well as general preventive health recommendations were provided to patient.     Willette Brace, LPN   [id number]   Nurse Notes: None

## 2021-02-26 NOTE — Patient Instructions (Signed)
Richard Mahoney , Thank you for taking time to come for your Medicare Wellness Visit. I appreciate your ongoing commitment to your health goals. Please review the following plan we discussed and let me know if I can assist you in the future.   Screening recommendations/referrals: Colonoscopy: Done 09/26/17 Recommended yearly ophthalmology/optometry visit for glaucoma screening and checkup Recommended yearly dental visit for hygiene and checkup  Vaccinations: Influenza vaccine: Due  Pneumococcal vaccine: Completed Tdap vaccine: Done 10/04/16 repeat every 10 years  due 10/05/26 Shingles vaccine: Shingrix discussed. Please contact your pharmacy for coverage information.    Covid-19: Completed 1/20, 2/17, & 06/26/20  Advanced directives: Copies in chart  Conditions/risks identified: Maintain exercise   Next appointment: Follow up in one year for your annual wellness visit.   Preventive Care 44 Years and Older, Male Preventive care refers to lifestyle choices and visits with your health care provider that can promote health and wellness. What does preventive care include? A yearly physical exam. This is also called an annual well check. Dental exams once or twice a year. Routine eye exams. Ask your health care provider how often you should have your eyes checked. Personal lifestyle choices, including: Daily care of your teeth and gums. Regular physical activity. Eating a healthy diet. Avoiding tobacco and drug use. Limiting alcohol use. Practicing safe sex. Taking low doses of aspirin every day. Taking vitamin and mineral supplements as recommended by your health care provider. What happens during an annual well check? The services and screenings done by your health care provider during your annual well check will depend on your age, overall health, lifestyle risk factors, and family history of disease. Counseling  Your health care provider may ask you questions about your: Alcohol  use. Tobacco use. Drug use. Emotional well-being. Home and relationship well-being. Sexual activity. Eating habits. History of falls. Memory and ability to understand (cognition). Work and work Statistician. Screening  You may have the following tests or measurements: Height, weight, and BMI. Blood pressure. Lipid and cholesterol levels. These may be checked every 5 years, or more frequently if you are over 41 years old. Skin check. Lung cancer screening. You may have this screening every year starting at age 77 if you have a 30-pack-year history of smoking and currently smoke or have quit within the past 15 years. Fecal occult blood test (FOBT) of the stool. You may have this test every year starting at age 77. Flexible sigmoidoscopy or colonoscopy. You may have a sigmoidoscopy every 5 years or a colonoscopy every 10 years starting at age 77. Prostate cancer screening. Recommendations will vary depending on your family history and other risks. Hepatitis C blood test. Hepatitis B blood test. Sexually transmitted disease (STD) testing. Diabetes screening. This is done by checking your blood sugar (glucose) after you have not eaten for a while (fasting). You may have this done every 1-3 years. Abdominal aortic aneurysm (AAA) screening. You may need this if you are a current or former smoker. Osteoporosis. You may be screened starting at age 77 if you are at high risk. Talk with your health care provider about your test results, treatment options, and if necessary, the need for more tests. Vaccines  Your health care provider may recommend certain vaccines, such as: Influenza vaccine. This is recommended every year. Tetanus, diphtheria, and acellular pertussis (Tdap, Td) vaccine. You may need a Td booster every 10 years. Zoster vaccine. You may need this after age 77. Pneumococcal 13-valent conjugate (PCV13) vaccine. One dose is  recommended after age 77. Pneumococcal polysaccharide  (PPSV23) vaccine. One dose is recommended after age 77. Talk to your health care provider about which screenings and vaccines you need and how often you need them. This information is not intended to replace advice given to you by your health care provider. Make sure you discuss any questions you have with your health care provider. Document Released: 07/24/2015 Document Revised: 03/16/2016 Document Reviewed: 04/28/2015 Elsevier Interactive Patient Education  2017 [employer] Prevention in the Home Falls can cause injuries. They can happen to people of all ages. There are many things you can do to make your home safe and to help prevent falls. What can I do on the outside of my home? Regularly fix the edges of walkways and driveways and fix any cracks. Remove anything that might make you trip as you walk through a door, such as a raised step or threshold. Trim any bushes or trees on the path to your home. Use bright outdoor lighting. Clear any walking paths of anything that might make someone trip, such as rocks or tools. Regularly check to see if handrails are loose or broken. Make sure that both sides of any steps have handrails. Any raised decks and porches should have guardrails on the edges. Have any leaves, snow, or ice cleared regularly. Use sand or salt on walking paths during winter. Clean up any spills in your garage right away. This includes oil or grease spills. What can I do in the bathroom? Use night lights. Install grab bars by the toilet and in the tub and shower. Do not use towel bars as grab bars. Use non-skid mats or decals in the tub or shower. If you need to sit down in the shower, use a plastic, non-slip stool. Keep the floor dry. Clean up any water that spills on the floor as soon as it happens. Remove soap buildup in the tub or shower regularly. Attach bath mats securely with double-sided non-slip rug tape. Do not have throw rugs and other things on the  floor that can make you trip. What can I do in the bedroom? Use night lights. Make sure that you have a light by your bed that is easy to reach. Do not use any sheets or blankets that are too big for your bed. They should not hang down onto the floor. Have a firm chair that has side arms. You can use this for support while you get dressed. Do not have throw rugs and other things on the floor that can make you trip. What can I do in the kitchen? Clean up any spills right away. Avoid walking on wet floors. Keep items that you use a lot in easy-to-reach places. If you need to reach something above you, use a strong step stool that has a grab bar. Keep electrical cords out of the way. Do not use floor polish or wax that makes floors slippery. If you must use wax, use non-skid floor wax. Do not have throw rugs and other things on the floor that can make you trip. What can I do with my stairs? Do not leave any items on the stairs. Make sure that there are handrails on both sides of the stairs and use them. Fix handrails that are broken or loose. Make sure that handrails are as long as the stairways. Check any carpeting to make sure that it is firmly attached to the stairs. Fix any carpet that is loose or worn. Avoid having  throw rugs at the top or bottom of the stairs. If you do have throw rugs, attach them to the floor with carpet tape. Make sure that you have a light switch at the top of the stairs and the bottom of the stairs. If you do not have them, ask someone to add them for you. What else can I do to help prevent falls? Wear shoes that: Do not have high heels. Have rubber bottoms. Are comfortable and fit you well. Are closed at the toe. Do not wear sandals. If you use a stepladder: Make sure that it is fully opened. Do not climb a closed stepladder. Make sure that both sides of the stepladder are locked into place. Ask someone to hold it for you, if possible. Clearly mark and make  sure that you can see: Any grab bars or handrails. First and last steps. Where the edge of each step is. Use tools that help you move around (mobility aids) if they are needed. These include: Canes. Walkers. Scooters. Crutches. Turn on the lights when you go into a dark area. Replace any light bulbs as soon as they burn out. Set up your furniture so you have a clear path. Avoid moving your furniture around. If any of your floors are uneven, fix them. If there are any pets around you, be aware of where they are. Review your medicines with your doctor. Some medicines can make you feel dizzy. This can increase your chance of falling. Ask your doctor what other things that you can do to help prevent falls. This information is not intended to replace advice given to you by your health care provider. Make sure you discuss any questions you have with your health care provider. Document Released: 04/23/2009 Document Revised: 12/03/2015 Document Reviewed: 08/01/2014 Elsevier Interactive Patient Education  2017 [employer].

## 2021-03-01 ENCOUNTER — Ambulatory Visit (INDEPENDENT_AMBULATORY_CARE_PROVIDER_SITE_OTHER): Payer: Commercial Managed Care - PPO | Admitting: Family Medicine

## 2021-03-01 ENCOUNTER — Encounter: Payer: Self-pay | Admitting: Family Medicine

## 2021-03-01 ENCOUNTER — Other Ambulatory Visit: Payer: Self-pay

## 2021-03-01 VITALS — BP 132/79 | HR 65 | Temp 98.2°F | Ht 72.0 in | Wt 191.0 lb

## 2021-03-01 DIAGNOSIS — Z0000 Encounter for general adult medical examination without abnormal findings: Secondary | ICD-10-CM | POA: Diagnosis not present

## 2021-03-01 DIAGNOSIS — E538 Deficiency of other specified B group vitamins: Secondary | ICD-10-CM

## 2021-03-01 DIAGNOSIS — E785 Hyperlipidemia, unspecified: Secondary | ICD-10-CM | POA: Diagnosis not present

## 2021-03-01 DIAGNOSIS — Z8546 Personal history of malignant neoplasm of prostate: Secondary | ICD-10-CM

## 2021-03-01 DIAGNOSIS — K219 Gastro-esophageal reflux disease without esophagitis: Secondary | ICD-10-CM

## 2021-03-01 DIAGNOSIS — E559 Vitamin D deficiency, unspecified: Secondary | ICD-10-CM | POA: Diagnosis not present

## 2021-03-01 DIAGNOSIS — I2510 Atherosclerotic heart disease of native coronary artery without angina pectoris: Secondary | ICD-10-CM

## 2021-03-01 LAB — CBC WITH DIFFERENTIAL/PLATELET
Basophils Absolute: 0 10*3/uL (ref 0.0–0.1)
Basophils Relative: 1.2 % (ref 0.0–3.0)
Eosinophils Absolute: 0.2 10*3/uL (ref 0.0–0.7)
Eosinophils Relative: 4.3 % (ref 0.0–5.0)
HCT: 40.8 % (ref 39.0–52.0)
Hemoglobin: 13.9 g/dL (ref 13.0–17.0)
Lymphocytes Relative: 37.9 % (ref 12.0–46.0)
Lymphs Abs: 1.4 10*3/uL (ref 0.7–4.0)
MCHC: 34 g/dL (ref 30.0–36.0)
MCV: 91 fl (ref 78.0–100.0)
Monocytes Absolute: 0.4 10*3/uL (ref 0.1–1.0)
Monocytes Relative: 9.9 % (ref 3.0–12.0)
Neutro Abs: 1.7 10*3/uL (ref 1.4–7.7)
Neutrophils Relative %: 46.7 % (ref 43.0–77.0)
Platelets: 191 10*3/uL (ref 150.0–400.0)
RBC: 4.49 Mil/uL (ref 4.22–5.81)
RDW: 13.4 % (ref 11.5–15.5)
WBC: 3.6 10*3/uL — ABNORMAL LOW (ref 4.0–10.5)

## 2021-03-01 LAB — LIPID PANEL
Cholesterol: 116 mg/dL (ref 0–200)
HDL: 45.1 mg/dL (ref >39.00)
LDL Cholesterol: 52 mg/dL (ref 0–99)
NonHDL: 71.11
Total CHOL/HDL Ratio: 3
Triglycerides: 97 mg/dL (ref 0.0–149.0)
VLDL: 19.4 mg/dL (ref 0.0–40.0)

## 2021-03-01 LAB — VITAMIN B12: Vitamin B-12: 995 pg/mL — ABNORMAL HIGH (ref 211–911)

## 2021-03-01 LAB — COMPREHENSIVE METABOLIC PANEL
ALT: 25 U/L (ref 0–53)
AST: 27 U/L (ref 0–37)
Albumin: 4.2 g/dL (ref 3.5–5.2)
Alkaline Phosphatase: 44 U/L (ref 39–117)
BUN: 19 mg/dL (ref 6–23)
CO2: 26 mEq/L (ref 19–32)
Calcium: 9.2 mg/dL (ref 8.4–10.5)
Chloride: 105 mEq/L (ref 96–112)
Creatinine, Ser: 1.08 mg/dL (ref 0.40–1.50)
GFR: 66.2 mL/min (ref >60.00)
Glucose, Bld: 92 mg/dL (ref 70–99)
Potassium: 4.3 mEq/L (ref 3.5–5.1)
Sodium: 140 mEq/L (ref 135–145)
Total Bilirubin: 1 mg/dL (ref 0.2–1.2)
Total Protein: 6.7 g/dL (ref 6.0–8.3)

## 2021-03-01 LAB — PSA: PSA: 0 ng/mL — ABNORMAL LOW (ref 0.10–4.00)

## 2021-03-01 LAB — VITAMIN D 25 HYDROXY (VIT D DEFICIENCY, FRACTURES): VITD: 76.81 ng/mL (ref 30.00–100.00)

## 2021-03-01 NOTE — Patient Instructions (Addendum)
Health Maintenance Due  Topic Date Due   Zoster Vaccines- Shingrix (1 of 2)   -Please check with your pharmacy to see if they have the shingrix vaccine. If they do- please get this immunization and update [place] by phone call or mychart with dates you receive the vaccine.   Never done   COVID-19 Vaccine (4 - Booster for Moderna series)   -Will get this scheduled in the fall.   09/24/2020   INFLUENZA VACCINE Not available in office yet.  Let us know when you get this - 02/08/2021  Please stop by lab before you go If you have mychart- we will send your results within 3 business days of [place] receiving them.  If you do not have mychart- we will call you about results within 5 business days of [place] receiving them.  *please also note that you will see labs on mychart as soon as they post. I will later go in and write notes on them- will say "notes from Dr. Yong Channel"  Please consider Trazodone for sleep and read about this. Let me know if you want to try this.  Great job maintaining exercise and diet! Keep up the good work!  Recommended follow up: Return in about 1 year (around 03/01/2022) for follow-up or sooner if needed. Kept same visit.

## 2021-03-03 ENCOUNTER — Encounter: Payer: Self-pay | Admitting: Family Medicine

## 2021-03-05 ENCOUNTER — Other Ambulatory Visit: Payer: Self-pay

## 2021-03-05 MED ORDER — TRAZODONE HCL 50 MG PO TABS
25.0000 mg | ORAL_TABLET | Freq: Every evening | ORAL | 3 refills | Status: DC | PRN
Start: 2021-03-05 — End: 2022-01-24

## 2021-03-14 ENCOUNTER — Encounter: Payer: Self-pay | Admitting: Family Medicine

## 2021-04-27 ENCOUNTER — Other Ambulatory Visit: Payer: Self-pay | Admitting: Cardiovascular Disease

## 2021-05-04 ENCOUNTER — Encounter: Payer: Self-pay | Admitting: Family Medicine

## 2021-06-08 ENCOUNTER — Encounter: Payer: Self-pay | Admitting: Family Medicine

## 2021-06-17 ENCOUNTER — Telehealth (INDEPENDENT_AMBULATORY_CARE_PROVIDER_SITE_OTHER): Payer: Commercial Managed Care - PPO | Admitting: Family Medicine

## 2021-06-17 ENCOUNTER — Encounter: Payer: Self-pay | Admitting: Family Medicine

## 2021-06-17 VITALS — HR 69 | Temp 98.6°F

## 2021-06-17 DIAGNOSIS — U071 COVID-19: Secondary | ICD-10-CM | POA: Diagnosis not present

## 2021-06-17 DIAGNOSIS — I2510 Atherosclerotic heart disease of native coronary artery without angina pectoris: Secondary | ICD-10-CM

## 2021-06-17 MED ORDER — BENZONATATE 100 MG PO CAPS: ORAL_CAPSULE | ORAL | 0 refills | Status: DC

## 2021-06-17 MED ORDER — MOLNUPIRAVIR EUA 200MG CAPSULE: 4.0000 | ORAL_CAPSULE | Freq: Two times a day (BID) | ORAL | 0 refills | Status: AC

## 2021-06-17 NOTE — Patient Instructions (Addendum)
HOME CARE TIPS:   -I sent the medication(s) we discussed to your pharmacy: Meds ordered this encounter  Medications   molnupiravir EUA (LAGEVRIO) 200 mg CAPS capsule    Sig: Take 4 capsules (800 mg total) by mouth 2 (two) times daily for 5 days.    Dispense:  40 capsule    Refill:  0   benzonatate (TESSALON PERLES) 100 MG capsule    Sig: 1-2 capsules up to twice daily as needed for cough    Dispense:  30 capsule    Refill:  0     -I sent in the [place] treatment or referral you requested per our discussion. Please see the information provided below and discuss further with the pharmacist/treatment team.   -there is a chance of rebound illness after finishing your treatment. If you become sick again please isolate for an additional 5 days, plus 5 more days of masking.   -can use tylenol  if needed for fevers, aches and pains per instructions  -can use nasal saline a few times per day if you have nasal congestion  -stay hydrated, drink plenty of fluids and eat small healthy meals - avoid dairy  -can take 1000 IU (64mg) Vit D3 and 100-500 mg of Vit C daily per instructions  -If the Covid test is positive, check out the [facility] for more information on home care, transmission and treatment for COVID19  -follow up with your doctor in 2-3 days unless improving and feeling better  -stay home while sick, except to seek medical care. If you have COVID19, you will likely be contagious for 7-10 days. Flu or Influenza is likely contagious for about 7 days. Other respiratory viral infections remain contagious for 5-10+ days depending on the virus and many other factors. Wear a good mask that fits snugly (such as N95 or KN95) if around others to reduce the risk of transmission.  It was nice to meet you today, and I really hope you are feeling better soon. I help [place] out with telemedicine visits on Tuesdays and Thursdays and am happy to help if you need a follow up virtual visit on  those days. Otherwise, if you have any concerns or questions following this visit please schedule a follow up visit with your Primary Care doctor or seek care at a local urgent care clinic to avoid delays in care.    Seek in person care or schedule a follow up video visit promptly if your symptoms worsen, new concerns arise or you are not improving with treatment. Call 911 and/or seek emergency care if your symptoms are severe or life threatening.    Fact Sheet for Patients And Caregivers Emergency Use Authorization (EUA) Of LAGEVRIOT (molnupiravir) capsules For Coronavirus Disease 2019 (COVID-19)  What is the most important information I should know about LAGEVRIO? LAGEVRIO may cause serious side effects, including: ? LAGEVRIO may cause harm to your unborn baby. It is not known if LAGEVRIO will harm your baby if you take LAGEVRIO during pregnancy. o LAGEVRIO is not recommended for use in pregnancy. o LAGEVRIO has not been studied in pregnancy. LAGEVRIO was studied in pregnant animals only. When LAGEVRIO was given to pregnant animals, LAGEVRIO caused harm to their unborn babies. o You and your healthcare provider may decide that you should take LAGEVRIO during pregnancy if there are no other COVID-19 treatment options approved or authorized by the FDA that are accessible or clinically appropriate for you. o If you and your healthcare provider decide that  you should take LAGEVRIO during pregnancy, you and your healthcare provider should discuss the known and potential benefits and the potential risks of taking LAGEVRIO during pregnancy. For individuals who are able to become pregnant: ? You should use a reliable method of birth control (contraception) consistently and correctly during treatment with LAGEVRIO and for 4 days after the last dose of LAGEVRIO. Talk to your healthcare provider about reliable birth control methods. ? Before starting treatment with [facility] your healthcare  provider may do a pregnancy test to see if you are pregnant before starting treatment with LAGEVRIO. ? Tell your healthcare provider right away if you become pregnant or think you may be pregnant during treatment with LAGEVRIO. Pregnancy Surveillance Program: ? There is a pregnancy surveillance program for individuals who take LAGEVRIO during pregnancy. The purpose of this program is to collect information about the health of you and your baby. Talk to your healthcare provider about how to take part in this program. ? If you take LAGEVRIO during pregnancy and you agree to participate in the pregnancy surveillance program and allow your healthcare provider to share your information with [place], then your healthcare provider will report your use of [place] during pregnancy to [place]. by calling [phone] or PeacefulBlog.es. For individuals who are sexually active with partners who are able to become pregnant: ? It is not known if LAGEVRIO can affect sperm. While the risk is regarded as low, animal studies to fully assess the potential for LAGEVRIO to affect the babies of males treated with LAGEVRIO have not been completed. A reliable method of birth control (contraception) should be used consistently and correctly during treatment with LAGEVRIO and for at least 3 months after the last dose. The risk to sperm beyond 3 months is not known. Studies to understand the risk to sperm beyond 3 months are ongoing. Talk to your healthcare provider about reliable birth control methods. Talk to your healthcare provider if you have questions or concerns about how LAGEVRIO may affect sperm. You are being given this fact sheet because your healthcare provider believes it is necessary to provide you with LAGEVRIO for the treatment of adults with mild-to-moderate coronavirus disease 2019 (COVID-19) with positive results of direct SARS-CoV-2 viral testing, and who  are at high risk for progression to severe COVID-19 including hospitalization or death, and for whom other COVID-19 treatment options approved or authorized by the FDA are not accessible or clinically appropriate. The U.S. Food and Drug Administration (FDA) has issued an Emergency Use Authorization (EUA) to make LAGEVRIO available during the COVID-19 pandemic (for more details about an EUA please see "What is an Emergency Use Authorization?" at the end of this document). LAGEVRIO is not an FDA-approved medicine in the [place]. Read this Fact Sheet for information about LAGEVRIO. Talk to your healthcare provider about your options if you have any questions. It is your choice to take LAGEVRIO.  What is COVID-19? COVID-19 is caused by a virus called a coronavirus. You can get COVID-19 through close contact with another person who has the virus. COVID-19 illnesses have ranged from very mild-to-severe, including illness resulting in death. While information so far suggests that most COVID-19 illness is mild, serious illness can happen and may cause some of your other medical conditions to become worse. Older people and people of all ages with severe, long lasting (chronic) medical conditions like heart disease, lung disease and diabetes, for example seem to be at higher risk of  being hospitalized for COVID-19.  What is LAGEVRIO? LAGEVRIO is an investigational medicine used to treat mild-to-moderate COVID-19 in adults: ? with positive results of direct SARS-CoV-2 viral testing, and ? who are at high risk for progression to severe COVID-19 including hospitalization or death, and for whom other COVID-19 treatment options approved or authorized by the FDA are not accessible or clinically appropriate. The FDA has authorized the emergency use of LAGEVRIO for the treatment of mild-tomoderate COVID-19 in adults under an EUA. For more information on EUA, see the "What is an Emergency Use  Authorization (EUA)?" section at the end of this Fact Sheet. LAGEVRIO is not authorized: ? for use in people less than 63 years of age. ? for prevention of COVID-19. ? for people needing hospitalization for COVID-19. ? for use for longer than 5 consecutive days.  What should I tell my healthcare provider before I take LAGEVRIO? Tell your healthcare provider if you: ? Have any allergies ? Are breastfeeding or plan to breastfeed ? Have any serious illnesses ? Are taking any medicines (prescription, over-the-counter, vitamins, or herbal products).  How do I take LAGEVRIO? ? Take LAGEVRIO exactly as your healthcare provider tells you to take it. ? Take 4 capsules of LAGEVRIO every 12 hours (for example, at 8 am and at 8 pm) ? Take LAGEVRIO for 5 days. It is important that you complete the full 5 days of treatment with LAGEVRIO. Do not stop taking LAGEVRIO before you complete the full 5 days of treatment, even if you feel better. ? Take LAGEVRIO with or without food. ? You should stay in isolation for as long as your healthcare provider tells you to. Talk to your healthcare provider if you are not sure about how to properly isolate while you have COVID-19. ? Swallow LAGEVRIO capsules whole. Do not open, break, or crush the capsules. If you cannot swallow capsules whole, tell your healthcare provider. ? What to do if you miss a dose: o If it has been less than 10 hours since the missed dose, take it as soon as you remember o If it has been more than 10 hours since the missed dose, skip the missed dose and take your dose at the next scheduled time. ? Do not double the dose of LAGEVRIO to make up for a missed dose.  What are the important possible side effects of LAGEVRIO? ? See, "What is the most important information I should know about LAGEVRIO?" ? Allergic Reactions. Allergic reactions can happen in people taking LAGEVRIO, even after only 1 dose. Stop taking LAGEVRIO and call your  healthcare provider right away if you get any of the following symptoms of an allergic reaction: o hives o rapid heartbeat o trouble swallowing or breathing o swelling of the mouth, lips, or face o throat tightness o hoarseness o skin rash The most common side effects of LAGEVRIO are: ? diarrhea ? nausea ? dizziness These are not all the possible side effects of LAGEVRIO. Not many people have taken LAGEVRIO. Serious and unexpected side effects may happen. This medicine is still being studied, so it is possible that all of the risks are not known at this time.  What other treatment choices are there?  Veklury (remdesivir) is FDA-approved as an intravenous (IV) infusion for the treatment of mildto-moderate DGLOV-56 in certain adults and children. Talk with your doctor to see if Marijean Heath is appropriate for you. Like LAGEVRIO, FDA may also allow for the emergency use of other medicines to treat  people with COVID-19. Go to LacrosseProperties.si for more information. It is your choice to be treated or not to be treated with LAGEVRIO. Should you decide not to take it, it will not change your standard medical care.  What if I am breastfeeding? Breastfeeding is not recommended during treatment with LAGEVRIO and for 4 days after the last dose of LAGEVRIO. If you are breastfeeding or plan to breastfeed, talk to your healthcare provider about your options and specific situation before taking LAGEVRIO.  How do I report side effects with LAGEVRIO? Contact your healthcare provider if you have any side effects that bother you or do not go away. Report side effects to FDA MedWatch at SmoothHits.hu or call 1-800-FDA-1088 (1- 218-020-4002).  How should I store [place]? ? Store LAGEVRIO capsules at room temperature between 7F to 22F (20C to 25C). ? Keep LAGEVRIO and all medicines out of  the reach of children and pets. How can I learn more about COVID-19? ? Ask your healthcare provider. ? Visit SeekRooms.co.uk ? Contact your local or state public health department. ? Call [place] at [phone] (toll free in the [place] ? Visit www.molnupiravir.com  What Is an Emergency Use Authorization (EUA)? The [place] FDA has made [place] available under an emergency access mechanism called an Emergency Use Authorization (EUA) The EUA is supported by a Presenter, broadcasting Health and Human Service (HHS) declaration that circumstances exist to justify emergency use of drugs and biological products during the COVID-19 pandemic. LAGEVRIO for the treatment of mild-to-moderate COVID-19 in adults with positive results of direct SARS-CoV-2 viral testing, who are at high risk for progression to severe COVID-19, including hospitalization or death, and for whom alternative COVID-19 treatment options approved or authorized by FDA are not accessible or clinically appropriate, has not undergone the same type of review as an FDA-approved product. In issuing an EUA under the JOINO-67 public health emergency, the FDA has determined, among other things, that based on the total amount of scientific evidence available including data from adequate and well-controlled clinical trials, if available, it is reasonable to believe that the product may be effective for diagnosing, treating, or preventing COVID-19, or a serious or life-threatening disease or condition caused by COVID-19; that the known and potential benefits of the product, when used to diagnose, treat, or prevent such disease or condition, outweigh the known and potential risks of such product; and that there are no adequate, approved, and available alternatives.  All of these criteria must be met to allow for the product to be used in the treatment of patients during the COVID-19 pandemic. The EUA for LAGEVRIO is in effect for  the duration of the COVID-19 declaration justifying emergency use of LAGEVRIO, unless terminated or revoked (after which LAGEVRIO may no longer be used under the EUA). For patent information: http://rogers.info/ Copyright  2021-2022 [place], [place] and its affiliates. All rights reserved. [id number] Revised: March 2022

## 2021-06-17 NOTE — Progress Notes (Signed)
Virtual Visit via Video Note  I connected with Samil  on 06/17/21 at  4:20 PM EST by a video enabled telemedicine application and verified that I am speaking with the correct person using two identifiers.  Location patient: home, [place] Location provider:work or home office Persons participating in the virtual visit: patient, provider  I discussed the limitations of evaluation and management by telemedicine and the availability of in person appointments. The patient expressed understanding and agreed to proceed.   HPI:  Acute telemedicine visit for Covid19: -Onset: 2 days ago; tested positive for covid today -a number of covid cases at his church -Symptoms include: sore throat, cough, HA (not severe), diarrhea a few times, body aches, fells tired, watery eyes -Denies: CP, SOB, vomiting, inability to eat/drink/get out of bed -O2>95 -Has tried:-Pertinent past medical history:see below -Pertinent medication allergies: Allergies  Allergen Reactions   Sulfa Antibiotics Rash    rash  -COVID-19 vaccine status: Immunization History  Administered Date(s) Administered   Fluad Quad(high Dose 65+) 04/04/2019, 04/15/2020, 04/22/2021   Influenza Split 05/30/2011, 05/15/2012   Influenza Whole 03/26/2008, 04/22/2010   Influenza, High Dose Seasonal PF 04/26/2013, 04/15/2014, 04/28/2016, 03/27/2018   Influenza,inj,Quad PF,6+ Mos 03/17/2015   Influenza-Unspecified 04/13/2017   Moderna Sars-Covid-2 Vaccination 07/31/2019, 08/28/2019, 06/26/2020   Pneumococcal Conjugate-13 04/15/2014   Pneumococcal Polysaccharide-23 04/22/2010   Td 07/12/1995, 03/26/2008   Tdap 10/04/2016   Zoster Recombinat (Shingrix) 03/13/2021, 06/08/2021   Zoster, Live 03/26/2008     ROS: See pertinent positives and negatives per HPI.  Past Medical History:  Diagnosis Date   Coronary artery disease    a. ETT 9/13: + ischemic ECG changes;  b. ETT-MV 9/13: EF 62%;  + ischemic EKG changes and + inf ischemia;  c. LHC 9/13:  pLAD 30%, mLAD 70-80%, oDx 40-50% ==> FFR hemodynamically significant stenosis in mLAD ==> PCI: Xience Xpedition DES to mLAD; Cath 06/01/12   single vessel CAD w/ patent mid LAD stent and normal LV systolic function   Fibromyalgia    muscle aches in past in both forearms, before statin even   GERD (gastroesophageal reflux disease)    Hyperlipidemia LDL goal < 70    Nephrolithiasis    Testosterone deficiency 04/28/2008   History treatment around 2009. No current treatment.       Past Surgical History:  Procedure Laterality Date   CARDIAC CATHETERIZATION  04/06/2012; 06/01/2012   CATARACT EXTRACTION W/ INTRAOCULAR LENS IMPLANT  08/2011   right .Left 12/2014.    CORONARY ANGIOPLASTY WITH STENT PLACEMENT  04/11/2012   DES LAD   CYSTOSCOPY W/ STONE MANIPULATION  2009   INGUINAL HERNIA REPAIR  1990's   right   KNEE ARTHROSCOPY  1990's   right   LEFT HEART CATHETERIZATION WITH CORONARY ANGIOGRAM N/A 06/01/2012   Procedure: LEFT HEART CATHETERIZATION WITH CORONARY ANGIOGRAM;  Surgeon: Burnell Blanks, MD;  Location: [facility] CATH LAB;  Service: Cardiovascular;  Laterality: N/A;   LYMPHADENECTOMY Bilateral 08/18/2014   Procedure: LYMPHADENECTOMY;  Surgeon: Raynelle Bring, MD;  Location: [facility] ORS;  Service: Urology;  Laterality: Bilateral;   PERCUTANEOUS CORONARY STENT INTERVENTION (PCI-S) N/A 04/11/2012   Procedure: PERCUTANEOUS CORONARY STENT INTERVENTION (PCI-S);  Surgeon: Burnell Blanks, MD;  Location: [facility] CATH LAB;  Service: Cardiovascular;  Laterality: N/A;   ROBOT ASSISTED LAPAROSCOPIC RADICAL PROSTATECTOMY N/A 08/18/2014   Procedure: ROBOTIC ASSISTED LAPAROSCOPIC RADICAL PROSTATECTOMY LEVEL 2;  Surgeon: Raynelle Bring, MD;  Location: [facility] ORS;  Service: Urology;  Laterality: N/A;     Current Outpatient Medications:  atorvastatin (LIPITOR) 40 MG tablet, TAKE 1 TABLET BY MOUTH  DAILY, Disp: 90 tablet, Rfl: 3   benzonatate (TESSALON PERLES) 100 MG capsule, 1-2 capsules up to twice daily as  needed for cough, Disp: 30 capsule, Rfl: 0   cholecalciferol (VITAMIN D3) 25 MCG (1000 UNIT) tablet, Take 4,000 Units by mouth daily., Disp: , Rfl:    clopidogrel (PLAVIX) 75 MG tablet, TAKE 1 TABLET BY MOUTH  DAILY, Disp: 90 tablet, Rfl: 0   cyanocobalamin 1000 MCG tablet, Take 1,000 mcg by mouth daily., Disp: , Rfl:    molnupiravir EUA (LAGEVRIO) 200 mg CAPS capsule, Take 4 capsules (800 mg total) by mouth 2 (two) times daily for 5 days., Disp: 40 capsule, Rfl: 0   nitroGLYCERIN (NITROSTAT) 0.4 MG SL tablet, Place 1 tablet (0.4 mg total) under the tongue every 5 (five) minutes as needed for chest pain., Disp: 25 tablet, Rfl: 3   pantoprazole (PROTONIX) 40 MG tablet, Take 1 tablet (40 mg total) by mouth daily., Disp: 90 tablet, Rfl: 3   polyvinyl alcohol (LIQUIFILM TEARS) 1.4 % ophthalmic solution, Place 1 drop into both eyes daily as needed for dry eyes., Disp: , Rfl:    sodium chloride (OCEAN) 0.65 % SOLN nasal spray, Place 1 spray into both nostrils daily as needed for congestion., Disp: , Rfl:    traZODone (DESYREL) 50 MG tablet, Take 0.5-1 tablets (25-50 mg total) by mouth at bedtime as needed for sleep., Disp: 90 tablet, Rfl: 3  EXAM:  VITALS per patient if applicable:  GENERAL: alert, oriented, appears well and in no acute distress  HEENT: atraumatic, conjunttiva clear, no obvious abnormalities on inspection of external nose and ears  NECK: normal movements of the head and neck  LUNGS: on inspection no signs of respiratory distress, breathing rate appears normal, no obvious gross SOB, gasping or wheezing  CV: no obvious cyanosis  MS: moves all visible extremities without noticeable abnormality  PSYCH/NEURO: pleasant and cooperative, no obvious depression or anxiety, speech and thought processing grossly intact  ASSESSMENT AND PLAN:  Discussed the following assessment and plan:  COVID-19   Discussed treatment options and risk of drug interactions, ideal treatment window,  potential complications, isolation and precautions for COVID-19.  Discussed possibility of rebound with antivirals and the need to reisolate if it should occur for 5 days. After lengthy discussion, the patient opted for treatment with Legevrio due to being higher risk for complications of covid or severe disease and other factors.The patient did want a prescription for cough, Tessalon Rx sent.  Other symptomatic care measures summarized in patient instructions. Advised to seek prompt vv follow up or in person care if worsening, new symptoms arise, or if is not improving with treatment. Discussed options for inperson care if PCP office not available. Did let this patient know that I only do telemedicine on Tuesdays and Thursdays for [place]. Advised to schedule follow up visit with PCP or [facility] if any further questions or concerns to avoid delays in care.   I discussed the assessment and treatment plan with the patient. The patient was provided an opportunity to ask questions and all were answered. The patient agreed with the plan and demonstrated an understanding of the instructions.     Lucretia Kern, DO

## 2021-06-22 ENCOUNTER — Encounter: Payer: Self-pay | Admitting: Family Medicine

## 2021-07-20 NOTE — Progress Notes (Signed)
Chief Complaint  Patient presents with   Follow-up    CAD   History of Present Illness: 76 yo male with history of CAD, kidney stones, GERD here today for cardiac follow up. Cardiac cath September 2013 with severe mid LAD stenosis involving the Diagonal branch. The LAD was treated with a drug eluting stent and angioplasty was performed on the ostium of the diagonal. Recurrent chest pain November 2013 with cardiac cath 06/01/12 demonstrating stable disease with patency of LAD stent. Stress test in February 2017 in [place] and he had no ischemia.   He is here today for follow up. The patient denies any chest pain, dyspnea, palpitations, lower extremity edema, orthopnea, PND, dizziness, near syncope or syncope.    Primary Care Physician: Marin Olp, MD  Past Medical History:  Diagnosis Date   Coronary artery disease    a. ETT 9/13: + ischemic ECG changes;  b. ETT-MV 9/13: EF 62%;  + ischemic EKG changes and + inf ischemia;  c. LHC 9/13: pLAD 30%, mLAD 70-80%, oDx 40-50% ==> FFR hemodynamically significant stenosis in mLAD ==> PCI: Xience Xpedition DES to mLAD; Cath 06/01/12   single vessel CAD w/ patent mid LAD stent and normal LV systolic function   Fibromyalgia    muscle aches in past in both forearms, before statin even   GERD (gastroesophageal reflux disease)    Hyperlipidemia LDL goal < 70    Nephrolithiasis    Testosterone deficiency 04/28/2008   History treatment around 2009. No current treatment.       Past Surgical History:  Procedure Laterality Date   CARDIAC CATHETERIZATION  04/06/2012; 06/01/2012   CATARACT EXTRACTION W/ INTRAOCULAR LENS IMPLANT  08/2011   right .Left 12/2014.    CORONARY ANGIOPLASTY WITH STENT PLACEMENT  04/11/2012   DES LAD   CYSTOSCOPY W/ STONE MANIPULATION  2009   INGUINAL HERNIA REPAIR  1990's   right   KNEE ARTHROSCOPY  1990's   right   LEFT HEART CATHETERIZATION WITH CORONARY ANGIOGRAM N/A 06/01/2012   Procedure: LEFT HEART  CATHETERIZATION WITH CORONARY ANGIOGRAM;  Surgeon: Burnell Blanks, MD;  Location: [facility] CATH LAB;  Service: Cardiovascular;  Laterality: N/A;   LYMPHADENECTOMY Bilateral 08/18/2014   Procedure: LYMPHADENECTOMY;  Surgeon: Raynelle Bring, MD;  Location: [facility] ORS;  Service: Urology;  Laterality: Bilateral;   PERCUTANEOUS CORONARY STENT INTERVENTION (PCI-S) N/A 04/11/2012   Procedure: PERCUTANEOUS CORONARY STENT INTERVENTION (PCI-S);  Surgeon: Burnell Blanks, MD;  Location: [facility] CATH LAB;  Service: Cardiovascular;  Laterality: N/A;   ROBOT ASSISTED LAPAROSCOPIC RADICAL PROSTATECTOMY N/A 08/18/2014   Procedure: ROBOTIC ASSISTED LAPAROSCOPIC RADICAL PROSTATECTOMY LEVEL 2;  Surgeon: Raynelle Bring, MD;  Location: [facility] ORS;  Service: Urology;  Laterality: N/A;    Current Outpatient Medications  Medication Sig Dispense Refill   atorvastatin (LIPITOR) 40 MG tablet TAKE 1 TABLET BY MOUTH  DAILY 90 tablet 3   benzonatate (TESSALON PERLES) 100 MG capsule 1-2 capsules up to twice daily as needed for cough 30 capsule 0   cholecalciferol (VITAMIN D3) 25 MCG (1000 UNIT) tablet Take 4,000 Units by mouth daily.     clopidogrel (PLAVIX) 75 MG tablet TAKE 1 TABLET BY MOUTH  DAILY 90 tablet 0   cyanocobalamin 1000 MCG tablet Take 1,000 mcg by mouth daily.     nitroGLYCERIN (NITROSTAT) 0.4 MG SL tablet Place 1 tablet (0.4 mg total) under the tongue every 5 (five) minutes as needed for chest pain. 25 tablet 3   pantoprazole (PROTONIX) 40 MG  tablet Take 1 tablet (40 mg total) by mouth daily. 90 tablet 3   polyvinyl alcohol (LIQUIFILM TEARS) 1.4 % ophthalmic solution Place 1 drop into both eyes daily as needed for dry eyes.     sodium chloride (OCEAN) 0.65 % SOLN nasal spray Place 1 spray into both nostrils daily as needed for congestion.     traZODone (DESYREL) 50 MG tablet Take 0.5-1 tablets (25-50 mg total) by mouth at bedtime as needed for sleep. 90 tablet 3   No current facility-administered medications for this  visit.    Allergies  Allergen Reactions   Sulfa Antibiotics Rash    rash    Social History   Socioeconomic History   Marital status: Married    Spouse name: Not on file   Number of children: 2   Years of education: Not on file   Highest education level: Not on file  Occupational History   Occupation: Retired  Tobacco Use   Smoking status: Never   Smokeless tobacco: Never  Substance and Sexual Activity   Alcohol use: No   Drug use: No   Sexual activity: Not Currently  Other Topics Concern   Not on file  Social History Narrative   Married (brother patient here, wife patient elsewhere). 2 children and 2 grandkids 4 and 2 in 2022 (daughter moving back to [facility] 2022 with 2 kids, son in [place] with ticketmaster).       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)   -wife retiring 2022      Hobbies: golf, work in yard, walking with wife   Social Determinants of Radio broadcast assistant Strain: Low Risk    Difficulty of Paying Living Expenses: Not hard at [employer] Insecurity: No Food Insecurity   Worried About Charity fundraiser in the Last Year: Never true   Arboriculturist in the Last Year: Never true  Transportation Needs: No Data processing manager (Medical): No   Lack of Transportation (Non-Medical): No  Physical Activity: Sufficiently Active   Days of Exercise per Week: 7 days   Minutes of Exercise per Session: 30 min  Stress: No Stress Concern Present   Feeling of Stress : Not at all  Social Connections: Socially Integrated   Frequency of Communication with Friends and Family: More than three times a week   Frequency of Social Gatherings with Friends and Family: More than three times a week   Attends Religious Services: More than 4 times per year   Active Member of [employer] or Organizations: Yes   Attends Archivist Meetings: 1 to 4 times per year   Marital Status: Married   Human resources officer Violence: Not At Risk   Fear of Current or Ex-Partner: No   Emotionally Abused: No   Physically Abused: No   Sexually Abused: No    Family History  Problem Relation Age of Onset   Diabetes Mother    Heart attack Mother 59   Diabetes Father    Heart attack Father 44   Diabetes Sister    Arthritis Brother     Review of Systems:  As stated in the HPI and otherwise negative.   BP 138/80    Pulse 68    Ht 6' (1.829 m)    Wt 197 lb (89.4 kg)    SpO2 98%    BMI 26.72 kg/m   Physical Examination:  General: Well developed, well nourished,  NAD  HEENT: OP clear, mucus membranes moist  SKIN: warm, dry. No rashes. Neuro: No focal deficits  Musculoskeletal: Muscle strength 5/5 all ext  Psychiatric: Mood and affect normal  Neck: No JVD, no carotid bruits, no thyromegaly, no lymphadenopathy.  Lungs:Clear bilaterally, no wheezes, rhonci, crackles Cardiovascular: Regular rate and rhythm. No murmurs, gallops or rubs. Abdomen:Soft. Bowel sounds present. Non-tender.  Extremities: No lower extremity edema. Pulses are 2 + in the bilateral DP/PT.  Cardiac cath 06/01/12:  Left main: No obstructive disease noted.  Left Anterior Descending Artery: Moderate caliber vessel that courses to the apex. The proximal vessel has a 30% stenosis. The mid vessel has a patent stented segment with no evidence of restenosis. The distal LAD has no obstructive disease. The diagonal branch is covered by the LAD stent and has a 10% ostial stenosis. There is excellent flow down the diagonal branch.  Intermediate Artery: Small caliber vessel with 30% stenosis.  Circumflex Artery: Large caliber, co-dominant vessel with no obstructive disease noted. There are three small caliber obtuse marginal branches that have no obstructive disease noted.  Right Coronary Artery: Small caliber, co-dominant vessel with mild plaque in the proximal and mid segments.  Left Ventricular Angiogram: LVEF=60-65%   EKG:  EKG  is ordered today. The ekg ordered today demonstrates sinus, PACs  Recent Labs: 03/01/2021: ALT 25; BUN 19; Creatinine, Ser 1.08; Hemoglobin 13.9; Platelets 191.0; Potassium 4.3; Sodium 140   Lipid Panel    Component Value Date/Time   CHOL 116 03/01/2021 1010   TRIG 97.0 03/01/2021 1010   HDL 45.10 03/01/2021 1010   CHOLHDL 3 03/01/2021 1010   VLDL 19.4 03/01/2021 1010   LDLCALC 52 03/01/2021 1010   LDLCALC 77 02/25/2020 0953   LDLDIRECT 86.0 10/16/2015 0841     Wt Readings from Last 3 Encounters:  07/21/21 197 lb (89.4 kg)  03/01/21 191 lb (86.6 kg)  10/06/20 204 lb 3.2 oz (92.6 kg)     Other studies Reviewed: Additional studies/ records that were reviewed today include: . Review of the above records demonstrates:    Assessment and Plan:   1. CAD without angina: No chest pain. He is not on a beta blocker due to bradycardia. Will continue Plavix and statin. BP is well controlled at home.   2. HLD: Lipids are followed in primary care. LDL 52 in October 2022. Continue statin.   Current medicines are reviewed at length with the patient today.  The patient does not have concerns regarding medicines.  The following changes have been made:  no change  Labs/ tests ordered today include:   Orders Placed This Encounter  Procedures   EKG 12-Lead     Disposition:   F/U with me in 12  months   Signed, Lauree Chandler, MD 07/21/2021 10:35 AM    [facility] [address], [place], [place]  [address] Phone: [phone]; Fax: [phone]

## 2021-07-21 ENCOUNTER — Encounter: Payer: Self-pay | Admitting: Cardiovascular Disease

## 2021-07-21 ENCOUNTER — Other Ambulatory Visit: Payer: Self-pay

## 2021-07-21 ENCOUNTER — Ambulatory Visit (INDEPENDENT_AMBULATORY_CARE_PROVIDER_SITE_OTHER): Payer: Commercial Managed Care - PPO | Admitting: Cardiovascular Disease

## 2021-07-21 VITALS — BP 138/80 | HR 68 | Ht 72.0 in | Wt 197.0 lb

## 2021-07-21 DIAGNOSIS — E7800 Pure hypercholesterolemia, unspecified: Secondary | ICD-10-CM

## 2021-07-21 DIAGNOSIS — I2510 Atherosclerotic heart disease of native coronary artery without angina pectoris: Secondary | ICD-10-CM | POA: Diagnosis not present

## 2021-07-21 NOTE — Patient Instructions (Signed)
Medication Instructions:  Your physician recommends that you continue on your current medications as directed. Please refer to the Current Medication list given to you today.  *If you need a refill on your cardiac medications before your next appointment, please call your pharmacy*   Lab Work: None today If you have labs (blood work) drawn today and your tests are completely normal, you will receive your results only by: [place] (if you have MyChart) OR A paper copy in the mail If you have any lab test that is abnormal or we need to change your treatment, we will call you to review the results.   Testing/Procedures: None today   Follow-Up: At [facility], you and your health needs are our priority.  As part of our continuing mission to provide you with exceptional heart care, we have created designated Provider Care Teams.  These Care Teams include your primary Cardiologist (physician) and Advanced Practice Providers (APPs -  Physician Assistants and Nurse Practitioners) who all work together to provide you with the care you need, when you need it.  We recommend signing up for the patient portal called "MyChart".  Sign up information is provided on this After Visit Summary.  MyChart is used to connect with patients for Virtual Visits (Telemedicine).  Patients are able to view lab/test results, encounter notes, upcoming appointments, etc.  Non-urgent messages can be sent to your provider as well.   To learn more about what you can do with MyChart, go to [email or web address].    Your next appointment:   1 year(s)  The format for your next appointment:   In Person  Provider:   Lauree Chandler, MD {

## 2021-07-22 ENCOUNTER — Other Ambulatory Visit: Payer: Self-pay | Admitting: Family Medicine

## 2021-08-28 ENCOUNTER — Other Ambulatory Visit: Payer: Self-pay | Admitting: Cardiovascular Disease

## 2021-10-06 ENCOUNTER — Encounter: Payer: Self-pay | Admitting: Family Medicine

## 2021-10-14 ENCOUNTER — Other Ambulatory Visit: Payer: Self-pay | Admitting: Family Medicine

## 2021-12-28 ENCOUNTER — Telehealth (INDEPENDENT_AMBULATORY_CARE_PROVIDER_SITE_OTHER): Payer: Medicare Other | Admitting: Family Medicine

## 2021-12-28 ENCOUNTER — Encounter: Payer: Self-pay | Admitting: Family Medicine

## 2021-12-28 VITALS — BP 114/68 | Wt 195.0 lb

## 2021-12-28 DIAGNOSIS — R197 Diarrhea, unspecified: Secondary | ICD-10-CM | POA: Diagnosis not present

## 2021-12-28 DIAGNOSIS — R110 Nausea: Secondary | ICD-10-CM | POA: Diagnosis not present

## 2021-12-28 MED ORDER — ONDANSETRON HCL 4 MG PO TABS: 4.0000 mg | ORAL_TABLET | Freq: Three times a day (TID) | ORAL | 0 refills | Status: DC | PRN

## 2021-12-28 NOTE — Patient Instructions (Signed)
-  please drink plenty of fluids (with electrolytes if not eating) as we discussed  -no dairy or red meat for 1 week  -imodium (loperamide) if needed per instructions if any further diarrhea  --I sent the medication(s) we discussed to your pharmacy: Meds ordered this encounter  Medications   ondansetron (ZOFRAN) 4 MG tablet    Sig: Take 1 tablet (4 mg total) by mouth every 8 (eight) hours as needed for nausea or vomiting.    Dispense:  10 tablet    Refill:  0   Consider covid test, if positive and in 1st 5 days of symptoms can contact [facility] pharmacy for antiviral.  I hope you are feeling better soon!  Seek in person care promptly if your symptoms worsen, new concerns arise or you are not improving with treatment.  It was nice to meet you today. I help  out with telemedicine visits on Tuesdays and Thursdays and am happy to help if you need a virtual follow up visit on those days. Otherwise, if you have any concerns or questions following this visit please schedule a follow up visit with your Primary Care office or seek care at a local urgent care clinic to avoid delays in care. If you are having severe or life threatening symptoms please call 911 and/or go to the nearest emergency room.

## 2021-12-28 NOTE — Progress Notes (Signed)
Virtual Visit via Video Note  I connected with Richard Mahoney  on 12/28/21 at 11:20 AM EDT by a video enabled telemedicine application and verified that I am speaking with the correct person using two identifiers.  Location patient: [place] Location provider:work or home office Persons participating in the virtual visit: patient, provider  I discussed the limitations and requested verbal permission for telemedicine visit. The patient expressed understanding and agreed to proceed.   HPI:  Acute telemedicine visit for upset stomach, diarrhea: -Onset: 3-4 days ago after trip to visit family for father's day -Symptoms include: nausea, bad taste in mouth, diarrhea - multiple episodes watery diarrhea intermittently (seems to worsen when consumes dairy)- now better, feels more tired than usual -Denies:fever, vomiting, melena, hematochezia, resp issues, abd pain (except for some cramps before diarrhea), inability to tol oral intake fluids -drinking gatorade -Pertinent past medical history: see below -Pertinent medication allergies: Allergies  Allergen Reactions   Sulfa Antibiotics Rash    rash  -COVID-19 vaccine status:  Immunization History  Administered Date(s) Administered   Fluad Quad(high Dose 65+) 04/04/2019, 04/15/2020, 04/22/2021   Influenza Split 05/30/2011, 05/15/2012   Influenza Whole 03/26/2008, 04/22/2010   Influenza, High Dose Seasonal PF 04/26/2013, 04/15/2014, 04/28/2016, 03/27/2018   Influenza,inj,Quad PF,6+ Mos 03/17/2015   Influenza-Unspecified 04/13/2017   Moderna Sars-Covid-2 Vaccination 07/31/2019, 08/28/2019, 06/26/2020   Pneumococcal Conjugate-13 04/15/2014   Pneumococcal Polysaccharide-23 04/22/2010   Td 07/12/1995, 03/26/2008   Tdap 10/04/2016   Zoster Recombinat (Shingrix) 03/13/2021, 06/08/2021   Zoster, Live 03/26/2008     ROS: See pertinent positives and negatives per HPI.  Past Medical History:  Diagnosis Date   Coronary artery disease    a. ETT 9/13: +  ischemic ECG changes;  b. ETT-MV 9/13: EF 62%;  + ischemic EKG changes and + inf ischemia;  c. LHC 9/13: pLAD 30%, mLAD 70-80%, oDx 40-50% ==> FFR hemodynamically significant stenosis in mLAD ==> PCI: Xience Xpedition DES to mLAD; Cath 06/01/12   single vessel CAD w/ patent mid LAD stent and normal LV systolic function   Fibromyalgia    muscle aches in past in both forearms, before statin even   GERD (gastroesophageal reflux disease)    Hyperlipidemia LDL goal < 70    Nephrolithiasis    Testosterone deficiency 04/28/2008   History treatment around 2009. No current treatment.       Past Surgical History:  Procedure Laterality Date   CARDIAC CATHETERIZATION  04/06/2012; 06/01/2012   CATARACT EXTRACTION W/ INTRAOCULAR LENS IMPLANT  08/2011   right .Left 12/2014.    CORONARY ANGIOPLASTY WITH STENT PLACEMENT  04/11/2012   DES LAD   CYSTOSCOPY W/ STONE MANIPULATION  2009   INGUINAL HERNIA REPAIR  1990's   right   KNEE ARTHROSCOPY  1990's   right   LEFT HEART CATHETERIZATION WITH CORONARY ANGIOGRAM N/A 06/01/2012   Procedure: LEFT HEART CATHETERIZATION WITH CORONARY ANGIOGRAM;  Surgeon: Burnell Blanks, MD;  Location: [facility] CATH LAB;  Service: Cardiovascular;  Laterality: N/A;   LYMPHADENECTOMY Bilateral 08/18/2014   Procedure: LYMPHADENECTOMY;  Surgeon: Raynelle Bring, MD;  Location: [facility] ORS;  Service: Urology;  Laterality: Bilateral;   PERCUTANEOUS CORONARY STENT INTERVENTION (PCI-S) N/A 04/11/2012   Procedure: PERCUTANEOUS CORONARY STENT INTERVENTION (PCI-S);  Surgeon: Burnell Blanks, MD;  Location: [facility] CATH LAB;  Service: Cardiovascular;  Laterality: N/A;   ROBOT ASSISTED LAPAROSCOPIC RADICAL PROSTATECTOMY N/A 08/18/2014   Procedure: ROBOTIC ASSISTED LAPAROSCOPIC RADICAL PROSTATECTOMY LEVEL 2;  Surgeon: Raynelle Bring, MD;  Location: [facility] ORS;  Service: Urology;  Laterality: N/A;     Current Outpatient Medications:    atorvastatin (LIPITOR) 40 MG tablet, TAKE 1 TABLET BY MOUTH  DAILY,  Disp: 90 tablet, Rfl: 3   cholecalciferol (VITAMIN D3) 25 MCG (1000 UNIT) tablet, Take 4,000 Units by mouth daily., Disp: , Rfl:    clopidogrel (PLAVIX) 75 MG tablet, TAKE 1 TABLET BY MOUTH  DAILY, Disp: 90 tablet, Rfl: 3   cyanocobalamin 1000 MCG tablet, Take 1,000 mcg by mouth daily., Disp: , Rfl:    nitroGLYCERIN (NITROSTAT) 0.4 MG SL tablet, Place 1 tablet (0.4 mg total) under the tongue every 5 (five) minutes as needed for chest pain., Disp: 25 tablet, Rfl: 3   ondansetron (ZOFRAN) 4 MG tablet, Take 1 tablet (4 mg total) by mouth every 8 (eight) hours as needed for nausea or vomiting., Disp: 10 tablet, Rfl: 0   pantoprazole (PROTONIX) 40 MG tablet, TAKE 1 TABLET BY MOUTH  DAILY, Disp: 90 tablet, Rfl: 3   polyvinyl alcohol (LIQUIFILM TEARS) 1.4 % ophthalmic solution, Place 1 drop into both eyes daily as needed for dry eyes., Disp: , Rfl:    sodium chloride (OCEAN) 0.65 % SOLN nasal spray, Place 1 spray into both nostrils daily as needed for congestion., Disp: , Rfl:    traZODone (DESYREL) 50 MG tablet, Take 0.5-1 tablets (25-50 mg total) by mouth at bedtime as needed for sleep., Disp: 90 tablet, Rfl: 3  EXAM:  VITALS per patient if applicable: denies fever, reports BP ok but a little lower than usual for him Today's Vitals   12/28/21 1057  BP: 114/68  Weight: 195 lb (88.5 kg)   Body mass index is 26.45 kg/m.   GENERAL: alert, oriented, appears well and in no acute distress  HEENT: atraumatic, conjunttiva clear, no obvious abnormalities on inspection of external nose and ears  NECK: normal movements of the head and neck  LUNGS: on inspection no signs of respiratory distress, breathing rate appears normal, no obvious gross SOB, gasping or wheezing  CV: no obvious cyanosis  MS: moves all visible extremities without noticeable abnormality  PSYCH/NEURO: pleasant and cooperative, no obvious depression or anxiety, speech and thought processing grossly intact  ASSESSMENT AND  PLAN:  Discussed the following assessment and plan:  Diarrhea, unspecified type  Nausea  -we discussed possible serious and likely etiologies, options for evaluation and workup, limitations of telemedicine visit vs in person visit, treatment, treatment risks and precautions. Pt is agreeable to treatment via telemedicine at this moment. Suspect gastroenteritis vs other. He has opted to try imodium for the diarrhea along with no dairy or red meat, oral hydration, zofran for nausea. Advised to seek prompt virtual visit or in person care if worsening, new symptoms arise, or if is not improving with treatment as expected per our conversation of expected course. Discussed options for follow up care. Did let this patient know that I do telemedicine on Tuesdays and Thursdays for [facility] and those are the days I am logged into the system. Advised to schedule follow up visit with PCP, [facility] virtual visits or [facility] if any further questions or concerns to avoid delays in care.   I discussed the assessment and treatment plan with the patient. The patient was provided an opportunity to ask questions and all were answered. The patient agreed with the plan and demonstrated an understanding of the instructions.     Lucretia Kern, DO

## 2022-01-22 ENCOUNTER — Other Ambulatory Visit: Payer: Self-pay | Admitting: Family Medicine

## 2022-01-23 ENCOUNTER — Encounter: Payer: Self-pay | Admitting: Family Medicine

## 2022-01-26 ENCOUNTER — Encounter: Payer: Self-pay | Admitting: Family Medicine

## 2022-01-26 ENCOUNTER — Ambulatory Visit (INDEPENDENT_AMBULATORY_CARE_PROVIDER_SITE_OTHER): Payer: Medicare Other | Admitting: Family Medicine

## 2022-01-26 VITALS — BP 136/80 | HR 72 | Temp 98.3°F | Ht 72.0 in | Wt 194.2 lb

## 2022-01-26 DIAGNOSIS — K219 Gastro-esophageal reflux disease without esophagitis: Secondary | ICD-10-CM

## 2022-01-26 DIAGNOSIS — I2510 Atherosclerotic heart disease of native coronary artery without angina pectoris: Secondary | ICD-10-CM | POA: Diagnosis not present

## 2022-01-26 DIAGNOSIS — E785 Hyperlipidemia, unspecified: Secondary | ICD-10-CM | POA: Diagnosis not present

## 2022-01-26 DIAGNOSIS — R1011 Right upper quadrant pain: Secondary | ICD-10-CM

## 2022-01-26 LAB — CBC WITH DIFFERENTIAL/PLATELET
Basophils Absolute: 0 10*3/uL (ref 0.0–0.1)
Basophils Relative: 0.9 % (ref 0.0–3.0)
Eosinophils Absolute: 0.4 10*3/uL (ref 0.0–0.7)
Eosinophils Relative: 8.9 % — ABNORMAL HIGH (ref 0.0–5.0)
HCT: 40.7 % (ref 39.0–52.0)
Hemoglobin: 13.9 g/dL (ref 13.0–17.0)
Lymphocytes Relative: 35.1 % (ref 12.0–46.0)
Lymphs Abs: 1.6 10*3/uL (ref 0.7–4.0)
MCHC: 34 g/dL (ref 30.0–36.0)
MCV: 91.4 fl (ref 78.0–100.0)
Monocytes Absolute: 0.4 10*3/uL (ref 0.1–1.0)
Monocytes Relative: 9.1 % (ref 3.0–12.0)
Neutro Abs: 2.1 10*3/uL (ref 1.4–7.7)
Neutrophils Relative %: 46 % (ref 43.0–77.0)
Platelets: 215 10*3/uL (ref 150.0–400.0)
RBC: 4.45 Mil/uL (ref 4.22–5.81)
RDW: 13.4 % (ref 11.5–15.5)
WBC: 4.5 10*3/uL (ref 4.0–10.5)

## 2022-01-26 LAB — COMPREHENSIVE METABOLIC PANEL
ALT: 15 U/L (ref 0–53)
AST: 17 U/L (ref 0–37)
Albumin: 4.3 g/dL (ref 3.5–5.2)
Alkaline Phosphatase: 56 U/L (ref 39–117)
BUN: 18 mg/dL (ref 6–23)
CO2: 29 mEq/L (ref 19–32)
Calcium: 9.1 mg/dL (ref 8.4–10.5)
Chloride: 102 mEq/L (ref 96–112)
Creatinine, Ser: 1.17 mg/dL (ref 0.40–1.50)
GFR: 59.75 mL/min — ABNORMAL LOW (ref >60.00)
Glucose, Bld: 97 mg/dL (ref 70–99)
Potassium: 4.5 mEq/L (ref 3.5–5.1)
Sodium: 138 mEq/L (ref 135–145)
Total Bilirubin: 1 mg/dL (ref 0.2–1.2)
Total Protein: 7 g/dL (ref 6.0–8.3)

## 2022-01-26 NOTE — Progress Notes (Signed)
Phone [phone] In person visit   Subjective:   Richard Mahoney is a 76 y.o. year old very pleasant male patient who presents for/with See problem oriented charting Chief Complaint  Patient presents with   loose bowels    Pt c/o loose bowels and fatigue since fathers day weekend, however it has gotten better.   Abdominal Pain    LUQ pain that radiates to his lower abdomen.   Past Medical History-  Patient Active Problem List   Diagnosis Date Noted   History of prostate cancer 08/18/2014    Priority: High   Coronary atherosclerosis of native coronary artery 04/12/2012    Priority: High   Vitamin B12 deficiency 10/31/2017    Priority: Medium    GERD (gastroesophageal reflux disease) 03/17/2015    Priority: Medium    Hyperlipidemia 04/25/2012    Priority: Medium    Insomnia 04/22/2010    Priority: Low   NEPHROLITHIASIS, HX OF 03/26/2008    Priority: Low    Medications- reviewed and updated Current Outpatient Medications  Medication Sig Dispense Refill   atorvastatin (LIPITOR) 40 MG tablet TAKE 1 TABLET BY MOUTH  DAILY 90 tablet 3   cholecalciferol (VITAMIN D3) 25 MCG (1000 UNIT) tablet Take 4,000 Units by mouth daily.     clopidogrel (PLAVIX) 75 MG tablet TAKE 1 TABLET BY MOUTH  DAILY 90 tablet 3   cyanocobalamin 1000 MCG tablet Take 1,000 mcg by mouth daily.     nitroGLYCERIN (NITROSTAT) 0.4 MG SL tablet Place 1 tablet (0.4 mg total) under the tongue every 5 (five) minutes as needed for chest pain. 25 tablet 3   ondansetron (ZOFRAN) 4 MG tablet Take 1 tablet (4 mg total) by mouth every 8 (eight) hours as needed for nausea or vomiting. 10 tablet 0   pantoprazole (PROTONIX) 40 MG tablet TAKE 1 TABLET BY MOUTH  DAILY 90 tablet 3   polyvinyl alcohol (LIQUIFILM TEARS) 1.4 % ophthalmic solution Place 1 drop into both eyes daily as needed for dry eyes.     sodium chloride (OCEAN) 0.65 % SOLN nasal spray Place 1 spray into both nostrils daily as needed for congestion.      traZODone (DESYREL) 50 MG tablet TAKE 1/2 TO 1 TABLET BY  MOUTH AT BEDTIME AS NEEDED  FOR SLEEP 90 tablet 3   No current facility-administered medications for this visit.     Objective:  BP 136/80   Pulse 72   Temp 98.3 F (36.8 C)   Ht 6' (1.829 m)   Wt 194 lb 3.2 oz (88.1 kg)   SpO2 98%   BMI 26.34 kg/m  Gen: NAD, resting comfortably CV: RRR no murmurs rubs or gallops Lungs: CTAB no crackles, wheeze, rhonchi Abdomen: soft/nontender/nondistended/normal bowel sounds. No rebound or guarding.  Ext: Minimal edema Skin: warm, dry    Assessment and Plan   #right upper quadrant pain and loose stools and vomiting S: Most recently- Friday the 14th after eating homemade chips with ranch dipat the [employer] (hadnt eaten all day). Wife ate and had no issues.  .-Had explosive diarrhea and vomiting-no bright red blood or coffee-ground emesis. Did have some bilious emesis right at the end. Had chils and would break out in sweat. No chest pain.  -Still has his gallbladder - Also admits to some nagging cough/congestion during the last month -zofran did help -Patient has noted right lower quadrant pain that radiates into his right abdomen on the left starting 30 minutes before the episode  He  is also noted loose bowels and fatigue since Father's Day weekend (about a month- grilled hamburger and later ice cream)- has had some improvement though.  He had a virtual visit on 12/28/2021 with Dr. Maudie Mercury after 3 to 4 days of intermittent watery diarrhea worse with dairy, fatigue, nausea, bad taste in his mouth-at that time there was concern for viral gastroenteritis most likely-was recommended to trial Imodium and make dietary modifications-also given Zofran for nausea. Also had RUQ pain with that episode -She reports he had a similar episode recently (in between above 2) but cutting back on dairy and meat seem to be helpful prior to the 14th A/P: Patient now with 3 separate episodes of loose stools and  right upper quadrant pain after eating fatty foods or dairy Products or fattier meats -each time had RUQ pain was after a meal-we will get right upper quadrant ultrasound to evaluate further -Has also had some cough over the last month-discussed possible chest x-ray.  Mucinex DM helps but when stops restarts-Wants to hold off on eval for now with imaging-remains on Protonix 40 mg so doubt reflux/GERD as cause of pain or cough -Update labs with CBC and CMP  #CAD #Hyperlipidemia-LDL goal under 70 S: Medications: Atorvastatin 40 mg daily- Also compliant with Plavix-not on aspirin.  -as never had to use nitroglycerin.  Follows with Dr. Angelena Form  Lab Results  Component Value Date   CHOL 116 03/01/2021   HDL 45.10 03/01/2021   LDLCALC 52 03/01/2021   LDLDIRECT 86.0 10/16/2015   TRIG 97.0 03/01/2021   CHOLHDL 3 03/01/2021  -No chest pain or shortness of breath reported-right upper quadrant pain is nonexertional  A/P: CAD is asymptomatic-I do not believe right upper quadrant pain is referred pain from CAD.  Continue Plavix and atorvastatin.  Lipids with excellent control last check-LDL under 70-continue current medications  #GERD S: Medication: Protonix 40 mg daily- breakthrough on '20mg'$  when used in the past-also failed Pepcid.   A/P: Appears controlled-continue Protonix 40 mg  Recommended follow up: Return for as needed for new, worsening, persistent symptoms. Future Appointments  Date Time Provider [place]  03/17/2022 11:45 AM [facility]    Lab/Order associations:   ICD-10-CM   1. RUQ pain  R10.11 CBC with Differential/Platelet    Comprehensive metabolic panel    US Abdomen Limited RUQ (LIVER/GB)    2. Atherosclerosis of native coronary artery of native heart without angina pectoris  I25.10     3. Hyperlipidemia, unspecified hyperlipidemia type  E78.5     4. Gastroesophageal reflux disease without esophagitis  K21.9      Return precautions advised.   Garret Reddish, MD

## 2022-01-26 NOTE — Patient Instructions (Addendum)
Your imaging referral has been sent to [facility].  Their phone number is [phone].  Please wait 2-3 business days before calling them to schedule your appointment if you have not heard from them  Please stop by lab before you go If you have mychart- we will send your results within 3 business days of [place] receiving them.  If you do not have mychart- we will call you about results within 5 business days of [place] receiving them.  *please also note that you will see labs on mychart as soon as they post. I will later go in and write notes on them- will say "notes from Dr. Yong Channel"   Stay away from fatty meats/dairy/fried foods until further evaluation   Recommended follow up: Return for as needed for new, worsening, persistent symptoms.

## 2022-02-02 ENCOUNTER — Encounter: Payer: Self-pay | Admitting: Family Medicine

## 2022-02-02 ENCOUNTER — Ambulatory Visit
Admission: RE | Admit: 2022-02-02 | Discharge: 2022-02-02 | Disposition: A | Discharge status: Home / Self Care | Payer: Medicare Other | Source: Ambulatory Visit | Attending: Family Medicine | Admitting: Family Medicine

## 2022-02-02 DIAGNOSIS — R1011 Right upper quadrant pain: Secondary | ICD-10-CM | POA: Diagnosis not present

## 2022-02-02 DIAGNOSIS — K824 Cholesterolosis of gallbladder: Secondary | ICD-10-CM | POA: Diagnosis not present

## 2022-02-17 ENCOUNTER — Other Ambulatory Visit: Payer: Self-pay | Admitting: Cardiovascular Disease

## 2022-02-17 NOTE — Telephone Encounter (Signed)
*  STAT* If patient is at the pharmacy, call can be transferred to refill team.   1. Which medications need to be refilled? (please list name of each medication and dose if known) nitroGLYCERIN (NITROSTAT) 0.4 MG SL tablet  2. Which pharmacy/location (including street and city if local pharmacy) is medication to be sent to? [id number]- [place], [place] - [address]  3. Do they need a 30 day or 90 day supply? 30 day   Patient is out of medication

## 2022-03-04 ENCOUNTER — Other Ambulatory Visit: Payer: Self-pay | Admitting: Family Medicine

## 2022-03-17 ENCOUNTER — Ambulatory Visit (INDEPENDENT_AMBULATORY_CARE_PROVIDER_SITE_OTHER): Payer: Medicare Other

## 2022-03-17 VITALS — Ht 72.0 in | Wt 194.0 lb

## 2022-03-17 DIAGNOSIS — Z0000 Encounter for general adult medical examination without abnormal findings: Secondary | ICD-10-CM | POA: Diagnosis not present

## 2022-03-17 NOTE — Patient Instructions (Addendum)
Richard Mahoney , Thank you for taking time to come for your Medicare Wellness Visit. I appreciate your ongoing commitment to your health goals. Please review the following plan we discussed and let me know if I can assist you in the future.   These are the goals we discussed:  Goals      Healthy lifestyle     Maintain exercise  Healthy diet Good water intake     Weight (lb) < 190 lb (86.2 kg)     One year ago, was 185 Careful with diet, cut out sugar; bread, soft drinks  Does not add salt   Check out  online nutrition programs as GumSearch.nl and http://vang.com/; fit33m; Look for foods with "whole" wheat; bran; oatmeal etc Shot at the farmer's markets in season for fresher choices  Watch for "hydrogenated" on the label of oils which are trans-fats.  Watch for "high fructose corn syrup" in snacks, yogurt or ketchup  Meats have less marbling; bright colored fruits and vegetables;  Canned; dump out liquid and wash vegetables. Be mindful of what we are eating  Portion control is essential to a health weight! Sit down; take a break and enjoy your meal; take smaller bites; put the fork down between bites;  It takes 20 minutes to get full; so check in with your fullness cues and stop eating when you start to fill full               This is a list of the screening recommended for you and due dates:  Health Maintenance  Topic Date Due   COVID-19 Vaccine (4 - Moderna risk series) 04/02/2022*   Flu Shot  10/09/2022*   Hepatitis C Screening: USPSTF Recommendation to screen - Ages 18-79 yo.  04/12/2060*   Tetanus Vaccine  10/05/2026   Pneumonia Vaccine  Completed   Zoster (Shingles) Vaccine  Completed   HPV Vaccine  Aged Out   Colon Cancer Screening  Discontinued  *Topic was postponed. The date shown is not the original due date.    Next appointment: Follow up in one year for your annual wellness visit.   Preventive Care 672Years and Older, Male Preventive care refers to  lifestyle choices and visits with your health care provider that can promote health and wellness. What does preventive care include? A yearly physical exam. This is also called an annual well check. Dental exams once or twice a year. Routine eye exams. Ask your health care provider how often you should have your eyes checked. Personal lifestyle choices, including: Daily care of your teeth and gums. Regular physical activity. Eating a healthy diet. Avoiding tobacco and drug use. Limiting alcohol use. Practicing safe sex. Taking low doses of aspirin every day. Taking vitamin and mineral supplements as recommended by your health care provider. What happens during an annual well check? The services and screenings done by your health care provider during your annual well check will depend on your age, overall health, lifestyle risk factors, and family history of disease. Counseling  Your health care provider may ask you questions about your: Alcohol use. Tobacco use. Drug use. Emotional well-being. Home and relationship well-being. Sexual activity. Eating habits. History of falls. Memory and ability to understand (cognition). Work and work eStatistician Screening  You may have the following tests or measurements: Height, weight, and BMI. Blood pressure. Lipid and cholesterol levels. These may be checked every 5 years, or more frequently if you are over 52years old. Skin check. Lung cancer  screening. You may have this screening every year starting at age 70 if you have a 30-pack-year history of smoking and currently smoke or have quit within the past 15 years. Fecal occult blood test (FOBT) of the stool. You may have this test every year starting at age 67. Flexible sigmoidoscopy or colonoscopy. You may have a sigmoidoscopy every 5 years or a colonoscopy every 10 years starting at age 16. Prostate cancer screening. Recommendations will vary depending on your family history and other  risks. Hepatitis C blood test. Hepatitis B blood test. Sexually transmitted disease (STD) testing. Diabetes screening. This is done by checking your blood sugar (glucose) after you have not eaten for a while (fasting). You may have this done every 1-3 years. Abdominal aortic aneurysm (AAA) screening. You may need this if you are a current or former smoker. Osteoporosis. You may be screened starting at age 71 if you are at high risk. Talk with your health care provider about your test results, treatment options, and if necessary, the need for more tests. Vaccines  Your health care provider may recommend certain vaccines, such as: Influenza vaccine. This is recommended every year. Tetanus, diphtheria, and acellular pertussis (Tdap, Td) vaccine. You may need a Td booster every 10 years. Zoster vaccine. You may need this after age 16. Pneumococcal 13-valent conjugate (PCV13) vaccine. One dose is recommended after age 56. Pneumococcal polysaccharide (PPSV23) vaccine. One dose is recommended after age 84. Talk to your health care provider about which screenings and vaccines you need and how often you need them. This information is not intended to replace advice given to you by your health care provider. Make sure you discuss any questions you have with your health care provider. Document Released: 07/24/2015 Document Revised: 03/16/2016 Document Reviewed: 04/28/2015 Elsevier Interactive Patient Education  2017 [place] Prevention in the Home Falls can cause injuries. They can happen to people of all ages. There are many things you can do to make your home safe and to help prevent falls. What can I do on the outside of my home? Regularly fix the edges of walkways and driveways and fix any cracks. Remove anything that might make you trip as you walk through a door, such as a raised step or threshold. Trim any bushes or trees on the path to your home. Use bright outdoor lighting. Clear  any walking paths of anything that might make someone trip, such as rocks or tools. Regularly check to see if handrails are loose or broken. Make sure that both sides of any steps have handrails. Any raised decks and porches should have guardrails on the edges. Have any leaves, snow, or ice cleared regularly. Use sand or salt on walking paths during winter. Clean up any spills in your garage right away. This includes oil or grease spills. What can I do in the bathroom? Use night lights. Install grab bars by the toilet and in the tub and shower. Do not use towel bars as grab bars. Use non-skid mats or decals in the tub or shower. If you need to sit down in the shower, use a plastic, non-slip stool. Keep the floor dry. Clean up any water that spills on the floor as soon as it happens. Remove soap buildup in the tub or shower regularly. Attach bath mats securely with double-sided non-slip rug tape. Do not have throw rugs and other things on the floor that can make you trip. What can I do in the bedroom? Use night  lights. Make sure that you have a light by your bed that is easy to reach. Do not use any sheets or blankets that are too big for your bed. They should not hang down onto the floor. Have a firm chair that has side arms. You can use this for support while you get dressed. Do not have throw rugs and other things on the floor that can make you trip. What can I do in the kitchen? Clean up any spills right away. Avoid walking on wet floors. Keep items that you use a lot in easy-to-reach places. If you need to reach something above you, use a strong step stool that has a grab bar. Keep electrical cords out of the way. Do not use floor polish or wax that makes floors slippery. If you must use wax, use non-skid floor wax. Do not have throw rugs and other things on the floor that can make you trip. What can I do with my stairs? Do not leave any items on the stairs. Make sure that there  are handrails on both sides of the stairs and use them. Fix handrails that are broken or loose. Make sure that handrails are as long as the stairways. Check any carpeting to make sure that it is firmly attached to the stairs. Fix any carpet that is loose or worn. Avoid having throw rugs at the top or bottom of the stairs. If you do have throw rugs, attach them to the floor with carpet tape. Make sure that you have a light switch at the top of the stairs and the bottom of the stairs. If you do not have them, ask someone to add them for you. What else can I do to help prevent falls? Wear shoes that: Do not have high heels. Have rubber bottoms. Are comfortable and fit you well. Are closed at the toe. Do not wear sandals. If you use a stepladder: Make sure that it is fully opened. Do not climb a closed stepladder. Make sure that both sides of the stepladder are locked into place. Ask someone to hold it for you, if possible. Clearly mark and make sure that you can see: Any grab bars or handrails. First and last steps. Where the edge of each step is. Use tools that help you move around (mobility aids) if they are needed. These include: Canes. Walkers. Scooters. Crutches. Turn on the lights when you go into a dark area. Replace any light bulbs as soon as they burn out. Set up your furniture so you have a clear path. Avoid moving your furniture around. If any of your floors are uneven, fix them. If there are any pets around you, be aware of where they are. Review your medicines with your doctor. Some medicines can make you feel dizzy. This can increase your chance of falling. Ask your doctor what other things that you can do to help prevent falls. This information is not intended to replace advice given to you by your health care provider. Make sure you discuss any questions you have with your health care provider. Document Released: 04/23/2009 Document Revised: 12/03/2015 Document Reviewed:  08/01/2014 Elsevier Interactive Patient Education  2017 [employer].

## 2022-03-17 NOTE — Progress Notes (Signed)
Subjective:   Richard Mahoney is a 76 y.o. male who presents for Medicare Annual/Subsequent preventive examination.  Review of Systems    No ROS.  Medicare Wellness Virtual Visit.  Visual/audio telehealth visit, UTA vital signs.   See social history for additional risk factors.   Cardiac Risk Factors include: male gender;advanced age (>3mn, >>69women)     Objective:    Today's Vitals   03/17/22 1141  Weight: 194 lb (88 kg)  Height: 6' (1.829 m)   Body mass index is 26.31 kg/m.     03/17/2022   11:46 AM 02/26/2021   11:45 AM 11/04/2016    2:06 PM 08/18/2014    6:03 AM 08/13/2014   10:32 AM 06/01/2012   10:24 PM 06/01/2012    3:20 PM  Advanced Directives  Does Patient Have a Medical Advance Directive? Yes Yes Yes Yes Yes Patient has advance directive, copy not in chart Patient has advance directive, copy in chart;Patient has advance directive, copy not in chart  Type of Advance Directive [place] will Healthcare Power of [place]  [place] Does patient want to make changes to medical advance directive? No - Patient declined   No - Patient declined No - Patient declined    Copy of [place] Chart? Yes - validated most recent copy scanned in chart (See row information) Yes - validated most recent copy scanned in chart (See row information)   Yes Copy requested from other (Comment) Copy requested from family  Pre-existing out of facility DNR order (yellow form or pink MOST form)       No    Current Medications (verified) Outpatient Encounter Medications as of 03/17/2022  Medication Sig   atorvastatin (LIPITOR) 40 MG tablet TAKE 1 TABLET BY MOUTH  DAILY   cholecalciferol (VITAMIN D3) 25 MCG (1000 UNIT) tablet Take 4,000 Units by mouth daily.   clopidogrel (PLAVIX) 75 MG tablet TAKE 1 TABLET BY MOUTH  DAILY   cyanocobalamin 1000 MCG  tablet Take 1,000 mcg by mouth daily.   nitroGLYCERIN (NITROSTAT) 0.4 MG SL tablet PLACE 1 TABLET UNDER THE TONGUE EVERY 5 MINUTES AS NEEDED FOR CHEST PAIN.   ondansetron (ZOFRAN) 4 MG tablet Take 1 tablet (4 mg total) by mouth every 8 (eight) hours as needed for nausea or vomiting.   pantoprazole (PROTONIX) 40 MG tablet TAKE 1 TABLET BY MOUTH DAILY   polyvinyl alcohol (LIQUIFILM TEARS) 1.4 % ophthalmic solution Place 1 drop into both eyes daily as needed for dry eyes.   sodium chloride (OCEAN) 0.65 % SOLN nasal spray Place 1 spray into both nostrils daily as needed for congestion.   traZODone (DESYREL) 50 MG tablet TAKE 1/2 TO 1 TABLET BY  MOUTH AT BEDTIME AS NEEDED  FOR SLEEP   No facility-administered encounter medications on file as of 03/17/2022.    Allergies (verified) Sulfa antibiotics   History: Past Medical History:  Diagnosis Date   Coronary artery disease    a. ETT 9/13: + ischemic ECG changes;  b. ETT-MV 9/13: EF 62%;  + ischemic EKG changes and + inf ischemia;  c. LHC 9/13: pLAD 30%, mLAD 70-80%, oDx 40-50% ==> FFR hemodynamically significant stenosis in mLAD ==> PCI: Xience Xpedition DES to mLAD; Cath 06/01/12   single vessel CAD w/ patent mid LAD stent and normal LV systolic function   Fibromyalgia    muscle aches in  past in both forearms, before statin even   GERD (gastroesophageal reflux disease)    Hyperlipidemia LDL goal < 70    Nephrolithiasis    Testosterone deficiency 04/28/2008   History treatment around 2009. No current treatment.      Past Surgical History:  Procedure Laterality Date   CARDIAC CATHETERIZATION  04/06/2012; 06/01/2012   CATARACT EXTRACTION W/ INTRAOCULAR LENS IMPLANT  08/2011   right .Left 12/2014.    CORONARY ANGIOPLASTY WITH STENT PLACEMENT  04/11/2012   DES LAD   CYSTOSCOPY W/ STONE MANIPULATION  2009   INGUINAL HERNIA REPAIR  1990's   right   KNEE ARTHROSCOPY  1990's   right   LEFT HEART CATHETERIZATION WITH CORONARY ANGIOGRAM N/A  06/01/2012   Procedure: LEFT HEART CATHETERIZATION WITH CORONARY ANGIOGRAM;  Surgeon: Burnell Blanks, MD;  Location: [facility] CATH LAB;  Service: Cardiovascular;  Laterality: N/A;   LYMPHADENECTOMY Bilateral 08/18/2014   Procedure: LYMPHADENECTOMY;  Surgeon: Raynelle Bring, MD;  Location: [facility] ORS;  Service: Urology;  Laterality: Bilateral;   PERCUTANEOUS CORONARY STENT INTERVENTION (PCI-S) N/A 04/11/2012   Procedure: PERCUTANEOUS CORONARY STENT INTERVENTION (PCI-S);  Surgeon: Burnell Blanks, MD;  Location: [facility] CATH LAB;  Service: Cardiovascular;  Laterality: N/A;   ROBOT ASSISTED LAPAROSCOPIC RADICAL PROSTATECTOMY N/A 08/18/2014   Procedure: ROBOTIC ASSISTED LAPAROSCOPIC RADICAL PROSTATECTOMY LEVEL 2;  Surgeon: Raynelle Bring, MD;  Location: [facility] ORS;  Service: Urology;  Laterality: N/A;   Family History  Problem Relation Age of Onset   Diabetes Mother    Heart attack Mother 71   Diabetes Father    Heart attack Father 88   Diabetes Sister    Arthritis Brother    Social History   Socioeconomic History   Marital status: Married    Spouse name: Not on file   Number of children: 2   Years of education: Not on file   Highest education level: Not on file  Occupational History   Occupation: Retired  Tobacco Use   Smoking status: Never   Smokeless tobacco: Never  Substance and Sexual Activity   Alcohol use: No   Drug use: No   Sexual activity: Not Currently  Other Topics Concern   Not on file  Social History Narrative   Married (brother patient here, wife patient elsewhere). 2 children and 2 grandkids 4 and 2 in 2022 (daughter  in [place] with 2 kids, son in [place] with [employer]- Investment banker, corporate).       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)   -wife retiring 2022      Hobbies: golf, work in yard, walking with wife   Social Determinants of Health   Financial Resource Strain: [place]  (03/17/2022)   Overall Financial  Resource Strain (CARDIA)    Difficulty of Paying Living Expenses: Not hard at all  Food Insecurity: No Food Insecurity (03/17/2022)   Hunger Vital Sign    Worried About Running Out of Food in the Last Year: Never true    [place] in the Last Year: Never true  Transportation Needs: No Transportation Needs (03/17/2022)   PRAPARE - Hydrologist (Medical): No    Lack of Transportation (Non-Medical): No  Physical Activity: Sufficiently Active (03/17/2022)   Exercise Vital Sign    Days of Exercise per Week: 5 days    Minutes of Exercise per Session: 30 min  Stress: No Stress Concern Present (03/17/2022)   [employer] of  Occupational Health - Occupational Stress Questionnaire    Feeling of Stress : Not at all  Social Connections: Unknown (03/17/2022)   Social Connection and Isolation Panel [NHANES]    Frequency of Communication with Friends and Family: Not on file    Frequency of Social Gatherings with Friends and Family: Not on file    Attends Religious Services: Not on file    Active Member of Clubs or Organizations: Not on file    Attends Archivist Meetings: Not on file    Marital Status: Married    Tobacco Counseling Counseling given: Not Answered   Clinical Intake:  Pre-visit preparation completed: Yes        Diabetes: No  How often do you need to have someone help you when you read instructions, pamphlets, or other written materials from your doctor or pharmacy?: 1 - Never  Interpreter Needed?: No      Activities of Daily Living    03/17/2022   11:40 AM  In your present state of health, do you have any difficulty performing the following activities:  Hearing? 0  Vision? 0  Difficulty concentrating or making decisions? 0  Walking or climbing stairs? 0  Dressing or bathing? 0  Doing errands, shopping? 0  Preparing Food and eating ? N  Using the Toilet? N  In the past six months, have you accidently leaked urine? N  Do  you have problems with loss of bowel control? N  Managing your Medications? N  Managing your Finances? N  Housekeeping or managing your Housekeeping? N    Patient Care Team: Marin Olp, MD as PCP - General (Family Medicine) Burnell Blanks, MD as PCP - Cardiology (Cardiology) Raynelle Bring, MD as Consulting Physician (Urology) Valinda Party, MD as Consulting Physician (Rheumatology)  Indicate any recent Medical Services you may have received from other than [facility] providers in the past year (date may be approximate).     Assessment:   This is a routine wellness examination for [place].  Virtual Visit via Telephone Note  I connected with  Richard Mahoney on 03/17/22 at 11:30 AM EDT by telephone and verified that I am speaking with the correct person using two identifiers.  Location: Patient: home Provider: office Persons participating in the virtual visit: patient/Nurse Health Advisor   I discussed the limitations, risks, security and privacy concerns of performing an evaluation and management service by telehealth. We continued and completed visit with audio only. Some vital signs may be absent or patient reported.     Hearing/Vision screen Hearing Screening - Comments:: Denies any hearing issues  Vision Screening - Comments:: Pt follows up with Dr Ovid Curd for annual eye exams   Dietary issues and exercise activities discussed: Current Exercise Habits: Home exercise routine, Type of exercise: walking, Time (Minutes): 30, Frequency (Times/Week): 5, Weekly Exercise (Minutes/Week): 150, Intensity: Mild   Goals Addressed             This Visit's Progress    Healthy lifestyle   On track    Maintain exercise  Healthy diet Good water intake       Depression Screen    03/17/2022   11:46 AM 02/26/2021   11:43 AM 02/25/2020    9:08 AM 01/17/2019   11:22 AM 01/17/2019   10:52 AM 11/04/2016    2:09 PM 11/04/2016    1:23 PM  PHQ 2/9 Scores  PHQ - 2 Score 0 0 0  0 0 0 0  PHQ- 9  Score   0        Fall Risk    03/17/2022   11:46 AM 02/26/2021   11:45 AM 10/06/2020    9:39 AM 02/25/2020    9:07 AM 01/17/2019   11:22 AM  Fall Risk   Falls in the past year? 0 0 0 0 0  Number falls in past yr: 0 0 0 0   Injury with Fall? 0 0 0 0   Risk for fall due to : No Fall Risks Impaired vision     Follow up Falls evaluation completed Falls prevention discussed       FALL RISK PREVENTION PERTAINING TO THE HOME: Home free of loose throw rugs in walkways, pet beds, electrical cords, etc? Yes  Adequate lighting in your home to reduce risk of falls? Yes   ASSISTIVE DEVICES UTILIZED TO PREVENT FALLS: Life alert? No  Use of a cane, walker or w/c? No   TIMED UP AND GO: Was the test performed? No .   Cognitive Function:  Patient is alert and oriented x3.       03/17/2022   11:41 AM 02/26/2021   11:46 AM  6CIT Screen  What Year? 0 points 0 points  What month? 0 points 0 points  What time? 0 points 0 points  Count back from 20  0 points  Months in reverse  0 points  Repeat phrase  0 points  Total Score  0 points    Immunizations Immunization History  Administered Date(s) Administered   Fluad Quad(high Dose 65+) 04/04/2019, 04/15/2020, 04/22/2021   Influenza Split 05/30/2011, 05/15/2012   Influenza Whole 03/26/2008, 04/22/2010   Influenza, High Dose Seasonal PF 04/26/2013, 04/15/2014, 04/28/2016, 03/27/2018   Influenza,inj,Quad PF,6+ Mos 03/17/2015   Influenza-Unspecified 04/13/2017   Moderna Sars-Covid-2 Vaccination 07/31/2019, 08/28/2019, 06/26/2020   Pneumococcal Conjugate-13 04/15/2014   Pneumococcal Polysaccharide-23 04/22/2010   Td 07/12/1995, 03/26/2008   Tdap 10/04/2016   Zoster Recombinat (Shingrix) 03/13/2021, 06/08/2021   Zoster, Live 03/26/2008   Flu Vaccine status: Due, Education has been provided regarding the importance of this vaccine. Advised may receive this vaccine at local pharmacy or Health Dept. Aware to provide a copy of the  vaccination record if obtained from local pharmacy or Health Dept. Verbalized acceptance and understanding. Deferred.   Screening Tests Health Maintenance  Topic Date Due   COVID-19 Vaccine (4 - Moderna risk series) 04/02/2022 (Originally 08/21/2020)   INFLUENZA VACCINE  10/09/2022 (Originally 02/08/2022)   Hepatitis C Screening  04/12/2060 (Originally 09/01/1963)   TETANUS/TDAP  10/05/2026   Pneumonia Vaccine 71+ Years old  Completed   Zoster Vaccines- Shingrix  Completed   HPV VACCINES  Aged Out   COLONOSCOPY (Pts 45-8yr Insurance coverage will need to be confirmed)  Discontinued   Health Maintenance There are no preventive care reminders to display for this patient.  Lung Cancer Screening: (Low Dose CT Chest recommended if Age 78-80years, 30 pack-year currently smoking OR have quit w/in 15years.) does not qualify.   Hepatitis C Screening: previously deferred.   Vision Screening: Recommended annual ophthalmology exams for early detection of glaucoma and other disorders of the eye.  Dental Screening: Recommended annual dental exams for proper oral hygiene  Community Resource Referral / Chronic Care Management: CRR required this visit?  No   CCM required this visit?  No      Plan:     I have personally reviewed and noted the following in the patient's chart:   Medical and social history  Use of alcohol, tobacco or illicit drugs  Current medications and supplements including opioid prescriptions. Patient is not currently taking opioid prescriptions. Functional ability and status Nutritional status Physical activity Advanced directives List of other physicians Hospitalizations, surgeries, and ER visits in previous 12 months Vitals Screenings to include cognitive, depression, and falls Referrals and appointments  In addition, I have reviewed and discussed with patient certain preventive protocols, quality metrics, and best practice recommendations. A written personalized  care plan for preventive services as well as general preventive health recommendations were provided to patient.     Varney Biles, LPN   02/09/5002

## 2022-04-04 ENCOUNTER — Encounter: Payer: Self-pay | Admitting: Family Medicine

## 2022-04-04 ENCOUNTER — Ambulatory Visit (INDEPENDENT_AMBULATORY_CARE_PROVIDER_SITE_OTHER): Payer: Medicare Other | Admitting: Family Medicine

## 2022-04-04 VITALS — BP 134/78 | HR 74 | Temp 98.1°F | Ht 72.0 in | Wt 190.4 lb

## 2022-04-04 DIAGNOSIS — E559 Vitamin D deficiency, unspecified: Secondary | ICD-10-CM | POA: Diagnosis not present

## 2022-04-04 DIAGNOSIS — Z0000 Encounter for general adult medical examination without abnormal findings: Secondary | ICD-10-CM

## 2022-04-04 DIAGNOSIS — E538 Deficiency of other specified B group vitamins: Secondary | ICD-10-CM | POA: Diagnosis not present

## 2022-04-04 DIAGNOSIS — E785 Hyperlipidemia, unspecified: Secondary | ICD-10-CM | POA: Diagnosis not present

## 2022-04-04 DIAGNOSIS — Z8546 Personal history of malignant neoplasm of prostate: Secondary | ICD-10-CM | POA: Diagnosis not present

## 2022-04-04 MED ORDER — BUSPIRONE HCL 5 MG PO TABS: 5.0000 mg | ORAL_TABLET | Freq: Two times a day (BID) | ORAL | 3 refills | Status: DC | PRN

## 2022-04-04 NOTE — Patient Instructions (Addendum)
Please stop by lab before you go If you have mychart- we will send your results within 3 business days of [place] receiving them.  If you do not have mychart- we will call you about results within 5 business days of [place] receiving them.  *please also note that you will see labs on mychart as soon as they post. I will later go in and write notes on them- will say "notes from Dr. Yong Channel"   Trial buspirone at night to see if helps sleep- can actually use with trazodone  Recommended follow up: Return in about 1 year (around 04/05/2023) for physical or sooner if needed.Schedule b4 you leave.

## 2022-04-04 NOTE — Progress Notes (Signed)
Phone: [phone]   Subjective:  Patient presents today for their annual physical. Chief complaint-noted.   See problem oriented charting- ROS- full  review of systems was completed and negative  except for: poor sleep with busy mind   The following were reviewed and entered/updated in epic: Past Medical History:  Diagnosis Date   Coronary artery disease    a. ETT 9/13: + ischemic ECG changes;  b. ETT-MV 9/13: EF 62%;  + ischemic EKG changes and + inf ischemia;  c. LHC 9/13: pLAD 30%, mLAD 70-80%, oDx 40-50% ==> FFR hemodynamically significant stenosis in mLAD ==> PCI: Xience Xpedition DES to mLAD; Cath 06/01/12   single vessel CAD w/ patent mid LAD stent and normal LV systolic function   Fibromyalgia    muscle aches in past in both forearms, before statin even   GERD (gastroesophageal reflux disease)    Hyperlipidemia LDL goal < 70    Nephrolithiasis    Testosterone deficiency 04/28/2008   History treatment around 2009. No current treatment.      Patient Active Problem List   Diagnosis Date Noted   History of prostate cancer 08/18/2014    Priority: High   Coronary atherosclerosis of native coronary artery 04/12/2012    Priority: High   Vitamin B12 deficiency 10/31/2017    Priority: Medium    GERD (gastroesophageal reflux disease) 03/17/2015    Priority: Medium    Hyperlipidemia 04/25/2012    Priority: Medium    Insomnia 04/22/2010    Priority: Low   NEPHROLITHIASIS, HX OF 03/26/2008    Priority: Low   Vitamin D deficiency 04/04/2022   Past Surgical History:  Procedure Laterality Date   CARDIAC CATHETERIZATION  04/06/2012; 06/01/2012   CATARACT EXTRACTION W/ INTRAOCULAR LENS IMPLANT  08/2011   right .Left 12/2014.    CORONARY ANGIOPLASTY WITH STENT PLACEMENT  04/11/2012   DES LAD   CYSTOSCOPY W/ STONE MANIPULATION  2009   INGUINAL HERNIA REPAIR  1990's   right   KNEE ARTHROSCOPY  1990's   right   LEFT HEART CATHETERIZATION WITH CORONARY ANGIOGRAM N/A 06/01/2012    Procedure: LEFT HEART CATHETERIZATION WITH CORONARY ANGIOGRAM;  Surgeon: Burnell Blanks, MD;  Location: [facility] CATH LAB;  Service: Cardiovascular;  Laterality: N/A;   LYMPHADENECTOMY Bilateral 08/18/2014   Procedure: LYMPHADENECTOMY;  Surgeon: Raynelle Bring, MD;  Location: [facility] ORS;  Service: Urology;  Laterality: Bilateral;   PERCUTANEOUS CORONARY STENT INTERVENTION (PCI-S) N/A 04/11/2012   Procedure: PERCUTANEOUS CORONARY STENT INTERVENTION (PCI-S);  Surgeon: Burnell Blanks, MD;  Location: [facility] CATH LAB;  Service: Cardiovascular;  Laterality: N/A;   ROBOT ASSISTED LAPAROSCOPIC RADICAL PROSTATECTOMY N/A 08/18/2014   Procedure: ROBOTIC ASSISTED LAPAROSCOPIC RADICAL PROSTATECTOMY LEVEL 2;  Surgeon: Raynelle Bring, MD;  Location: [facility] ORS;  Service: Urology;  Laterality: N/A;    Family History  Problem Relation Age of Onset   Diabetes Mother    Heart attack Mother 72   Diabetes Father    Heart attack Father 22   Diabetes Sister    Arthritis Brother     Medications- reviewed and updated Current Outpatient Medications  Medication Sig Dispense Refill   atorvastatin (LIPITOR) 40 MG tablet TAKE 1 TABLET BY MOUTH  DAILY 90 tablet 3   busPIRone (BUSPAR) 5 MG tablet Take 1 tablet (5 mg total) by mouth 2 (two) times daily as needed. 60 tablet 3   cholecalciferol (VITAMIN D3) 25 MCG (1000 UNIT) tablet Take 4,000 Units by mouth daily.     clopidogrel (PLAVIX) 75 MG  tablet TAKE 1 TABLET BY MOUTH  DAILY 90 tablet 3   cyanocobalamin 1000 MCG tablet Take 1,000 mcg by mouth daily.     nitroGLYCERIN (NITROSTAT) 0.4 MG SL tablet PLACE 1 TABLET UNDER THE TONGUE EVERY 5 MINUTES AS NEEDED FOR CHEST PAIN. 25 tablet 5   ondansetron (ZOFRAN) 4 MG tablet Take 1 tablet (4 mg total) by mouth every 8 (eight) hours as needed for nausea or vomiting. 10 tablet 0   pantoprazole (PROTONIX) 40 MG tablet TAKE 1 TABLET BY MOUTH DAILY 100 tablet 2   polyvinyl alcohol (LIQUIFILM TEARS) 1.4 % ophthalmic solution Place 1 drop  into both eyes daily as needed for dry eyes.     sodium chloride (OCEAN) 0.65 % SOLN nasal spray Place 1 spray into both nostrils daily as needed for congestion.     traZODone (DESYREL) 50 MG tablet TAKE 1/2 TO 1 TABLET BY  MOUTH AT BEDTIME AS NEEDED  FOR SLEEP 90 tablet 3   No current facility-administered medications for this visit.    Allergies-reviewed and updated Allergies  Allergen Reactions   Sulfa Antibiotics Rash    rash    Social History   Social History Narrative   Married (brother patient here, wife patient elsewhere). 2 children and 2 grandkids 4 and 2 in 2022 (daughter  in [place] with 2 kids, son in [place] with [employer]- Investment banker, corporate).       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 Oropharynx normal Neck: no thyromegaly CV: RRR no murmurs rubs or gallops Lungs: CTAB no crackles, wheeze, rhonchi Abdomen: soft/nontender/nondistended/normal bowel sounds. No rebound or guarding.  Ext: no edema Skin: warm, dry, lipoma on right forearm and Left upper abdomen noted Neuro: grossly normal, moves all extremities, PERRLA   Assessment and Plan  76 y.o. male presenting for annual physical.  Health Maintenance counseling: 1. Anticipatory guidance: Patient counseled regarding regular dental exams -q6 months, eye exams -yearly,  avoiding smoking and second hand smoke , limiting alcohol to 2 beverages per day - doesn't drink, no illicit drugs .   2. Risk factor reduction:  Advised patient of need for regular exercise and diet rich and fruits and vegetables to reduce risk of heart attack and stroke.  Exercise- 5 days a week  for 30 mins on treadmill.  Diet/weight management- got down to 185 on 03/17/22. 188 on home scales this AM- down 4 lbs from last visit Wt Readings from Last 3 Encounters:  04/04/22 190 lb 6.4 oz (86.4 kg)  03/17/22 194 lb (88 kg)  01/26/22 194 lb 3.2 oz (88.1 kg)  3. Immunizations/screenings/ancillary studies- flu shot today , covid shot at pharmacy recommended - considering, rsv -considering at pharmacy Immunization History  Administered Date(s) Administered   Fluad Quad(high Dose 65+) 04/04/2019, 04/15/2020, 04/22/2021   Influenza Split 05/30/2011, 05/15/2012   Influenza Whole 03/26/2008, 04/22/2010   Influenza, High Dose Seasonal PF 04/26/2013, 04/15/2014, 04/28/2016, 03/27/2018   Influenza,inj,Quad PF,6+ Mos 03/17/2015   Influenza-Unspecified 04/13/2017   Moderna Sars-Covid-2 Vaccination 07/31/2019, 08/28/2019, 06/26/2020   Pneumococcal Conjugate-13  04/15/2014   Pneumococcal Polysaccharide-23 04/22/2010   Td 07/12/1995, 03/26/2008   Tdap 10/04/2016   Zoster Recombinat (Shingrix) 03/13/2021, 06/08/2021   Zoster, Live 03/26/2008  4. Prostate cancer screening-  low risk prior psa trend after history of radical prostatectomy with Dr. Alinda Money- we monitor PSAS Lab Results  Component Value Date   PSA 0.00 Repeated and verified X2. (L) 03/01/2021   PSA 0.015ng 11/21/2018   PSA 0.015 11/08/2017   5. Colon cancer screening - last in 2019 and was released 6. Skin cancer screening- sees Dr. Jimmye Norman for dermatology- formal evals.  advised regular sunscreen use. Denies worrisome, changing, or new skin lesions.  7. Smoking associated screening (lung cancer screening, AAA screen 65-75, UA)- never smoker 8. STD screening - only active with wife  Status of chronic or acute concerns   #Gallbladder polyp/right upper quadrant pain-patient presented with right upper quadrant pain in July with 3 separate episodes of loose stool and right upper quadrant pain after eating fatty foods or dairy-always  occurred after meal -Offered surgical consult-patient has  wanted to monitor and see if symptoms are recurrent first  -has not had further issues and is monitoring diet a little more carefully  #CAD #Hyperlipidemia-LDL goal under 70 S: Medications: Atorvastatin 40 mg daily- Also compliant with Plavix-not on aspirin.  -has never had to use nitroglycerin-no chest pain or shortness of breath.  Follows with Dr. Angelena Form with last visit July 21, 2021 Lab Results  Component Value Date   CHOL 116 03/01/2021   HDL 45.10 03/01/2021   LDLCALC 52 03/01/2021   LDLDIRECT 86.0 10/16/2015   TRIG 97.0 03/01/2021   CHOLHDL 3 03/01/2021    A/P: CAD remains asymptomatic-continue current medication - Hyperlipidemia hopefully at goal still with LDL under 70-update lipid panel with labs today-for now continue current medication  #GERD S: Medication: Protonix 40 mg daily- breakthrough on '20mg'$ .   A/P:   Controlled. Continue current medications.    #Insomnia-melatonin 5 mg remained helpful then later needed trazodone but not as effective now. Had sparing alprazolam left over from cardiology and slept well.   - he may trial atorvastatin in the morning just to see if it makes any difference (doubt it will but low risk to trial) -rozerem looks expensive on tiers - dark room and avoids screens 30 minutes before bed - since mind racing may trial buspirone- may do before bed with trazodone even - I wouldn't be against a very sparing dose of alprazolam (dependence potential) in the future but prefer to trial other options first and space the med out  # B12 deficiency S: Current treatment/medication (oral vs. IM): Oral over-the-counter A/P: B12 slightly elevated last visit-continues oral B12-update B12 levels with labs today and for now continue current medication  #Vitamin D deficiency S: Medication:  1000 units daily Lab Results  Component Value Date   VD25OH 76.81 03/01/2021  A/P: Well-controlled on last  check-update vitamin D with labs today   #Polymyalgia rheumatica history-  remained off prednisone and methotrexate for now 2 years-followed with Dr. Dossie Der previously- available if needed   Recommended follow up: Return in about 1 year (around 04/05/2023) for physical or sooner if needed.Schedule b4 you leave.  Lab/Order associations: fasting   ICD-10-CM   1. Preventative health care  Z00.00     2. Vitamin B12 deficiency  E53.8 Vitamin B12    3. Hyperlipidemia, unspecified hyperlipidemia type  E78.5 CBC with Differential/Platelet    Comprehensive metabolic panel    Lipid panel  4. Vitamin D deficiency  E55.9 Vitamin D (25 hydroxy)    5. History of prostate cancer  Z85.46 PSA      Meds ordered this encounter  Medications   busPIRone (BUSPAR) 5 MG tablet    Sig: Take 1 tablet (5 mg total) by mouth 2 (two) times daily as needed.    Dispense:  60 tablet    Refill:  3    Return precautions advised.  Garret Reddish, MD

## 2022-04-05 LAB — PSA: PSA: 0 ng/mL — ABNORMAL LOW (ref 0.10–4.00)

## 2022-04-05 LAB — LIPID PANEL
Cholesterol: 148 mg/dL (ref 0–200)
HDL: 48.3 mg/dL (ref >39.00)
LDL Cholesterol: 67 mg/dL (ref 0–99)
NonHDL: 100.12
Total CHOL/HDL Ratio: 3
Triglycerides: 164 mg/dL — ABNORMAL HIGH (ref 0.0–149.0)
VLDL: 32.8 mg/dL (ref 0.0–40.0)

## 2022-04-05 LAB — COMPREHENSIVE METABOLIC PANEL
ALT: 20 U/L (ref 0–53)
AST: 16 U/L (ref 0–37)
Albumin: 4.4 g/dL (ref 3.5–5.2)
Alkaline Phosphatase: 64 U/L (ref 39–117)
BUN: 25 mg/dL — ABNORMAL HIGH (ref 6–23)
CO2: 29 mEq/L (ref 19–32)
Calcium: 9.4 mg/dL (ref 8.4–10.5)
Chloride: 103 mEq/L (ref 96–112)
Creatinine, Ser: 1.23 mg/dL (ref 0.40–1.50)
GFR: 56.2 mL/min — ABNORMAL LOW (ref >60.00)
Glucose, Bld: 95 mg/dL (ref 70–99)
Potassium: 4.3 mEq/L (ref 3.5–5.1)
Sodium: 139 mEq/L (ref 135–145)
Total Bilirubin: 1 mg/dL (ref 0.2–1.2)
Total Protein: 7.2 g/dL (ref 6.0–8.3)

## 2022-04-05 LAB — CBC WITH DIFFERENTIAL/PLATELET
Basophils Absolute: 0.1 10*3/uL (ref 0.0–0.1)
Basophils Relative: 0.9 % (ref 0.0–3.0)
Eosinophils Absolute: 0.2 10*3/uL (ref 0.0–0.7)
Eosinophils Relative: 4.1 % (ref 0.0–5.0)
HCT: 42.1 % (ref 39.0–52.0)
Hemoglobin: 14.6 g/dL (ref 13.0–17.0)
Lymphocytes Relative: 36.1 % (ref 12.0–46.0)
Lymphs Abs: 2.1 10*3/uL (ref 0.7–4.0)
MCHC: 34.6 g/dL (ref 30.0–36.0)
MCV: 91 fl (ref 78.0–100.0)
Monocytes Absolute: 0.4 10*3/uL (ref 0.1–1.0)
Monocytes Relative: 7.6 % (ref 3.0–12.0)
Neutro Abs: 2.9 10*3/uL (ref 1.4–7.7)
Neutrophils Relative %: 51.3 % (ref 43.0–77.0)
Platelets: 223 10*3/uL (ref 150.0–400.0)
RBC: 4.62 Mil/uL (ref 4.22–5.81)
RDW: 12.8 % (ref 11.5–15.5)
WBC: 5.7 10*3/uL (ref 4.0–10.5)

## 2022-04-05 LAB — VITAMIN D 25 HYDROXY (VIT D DEFICIENCY, FRACTURES): VITD: 43.81 ng/mL (ref 30.00–100.00)

## 2022-04-05 LAB — VITAMIN B12: Vitamin B-12: 1051 pg/mL — ABNORMAL HIGH (ref 211–911)

## 2022-05-06 ENCOUNTER — Other Ambulatory Visit: Payer: Self-pay | Admitting: Cardiovascular Disease

## 2022-05-10 ENCOUNTER — Encounter: Payer: Self-pay | Admitting: Family Medicine

## 2022-05-11 NOTE — Telephone Encounter (Signed)
Patient will call back after next week to sch ov with hunter

## 2022-06-02 ENCOUNTER — Other Ambulatory Visit: Payer: Self-pay | Admitting: Family Medicine

## 2022-06-28 ENCOUNTER — Encounter: Payer: Self-pay | Admitting: Family Medicine

## 2022-06-28 DIAGNOSIS — A048 Other specified bacterial intestinal infections: Secondary | ICD-10-CM

## 2022-07-05 ENCOUNTER — Other Ambulatory Visit: Payer: Self-pay | Admitting: Cardiovascular Disease

## 2022-07-15 ENCOUNTER — Ambulatory Visit (INDEPENDENT_AMBULATORY_CARE_PROVIDER_SITE_OTHER): Payer: Medicare Other | Admitting: Family Medicine

## 2022-07-15 ENCOUNTER — Encounter: Payer: Self-pay | Admitting: Family Medicine

## 2022-07-15 VITALS — BP 118/72 | HR 89 | Temp 97.5°F | Resp 16 | Ht 72.0 in | Wt 190.0 lb

## 2022-07-15 DIAGNOSIS — K219 Gastro-esophageal reflux disease without esophagitis: Secondary | ICD-10-CM

## 2022-07-15 DIAGNOSIS — R051 Acute cough: Secondary | ICD-10-CM | POA: Diagnosis not present

## 2022-07-15 DIAGNOSIS — K30 Functional dyspepsia: Secondary | ICD-10-CM

## 2022-07-15 LAB — CBC WITH DIFFERENTIAL/PLATELET
Basophils Absolute: 0.1 10*3/uL (ref 0.0–0.1)
Basophils Relative: 0.7 % (ref 0.0–3.0)
Eosinophils Absolute: 0.3 10*3/uL (ref 0.0–0.7)
Eosinophils Relative: 4 % (ref 0.0–5.0)
HCT: 40.6 % (ref 39.0–52.0)
Hemoglobin: 14 g/dL (ref 13.0–17.0)
Lymphocytes Relative: 18.5 % (ref 12.0–46.0)
Lymphs Abs: 1.6 10*3/uL (ref 0.7–4.0)
MCHC: 34.5 g/dL (ref 30.0–36.0)
MCV: 90.3 fl (ref 78.0–100.0)
Monocytes Absolute: 0.8 10*3/uL (ref 0.1–1.0)
Monocytes Relative: 9.1 % (ref 3.0–12.0)
Neutro Abs: 5.9 10*3/uL (ref 1.4–7.7)
Neutrophils Relative %: 67.7 % (ref 43.0–77.0)
Platelets: 259 10*3/uL (ref 150.0–400.0)
RBC: 4.5 Mil/uL (ref 4.22–5.81)
RDW: 12.8 % (ref 11.5–15.5)
WBC: 8.7 10*3/uL (ref 4.0–10.5)

## 2022-07-15 LAB — COMPREHENSIVE METABOLIC PANEL
ALT: 16 U/L (ref 0–53)
AST: 13 U/L (ref 0–37)
Albumin: 4.1 g/dL (ref 3.5–5.2)
Alkaline Phosphatase: 69 U/L (ref 39–117)
BUN: 19 mg/dL (ref 6–23)
CO2: 27 mEq/L (ref 19–32)
Calcium: 8.9 mg/dL (ref 8.4–10.5)
Chloride: 102 mEq/L (ref 96–112)
Creatinine, Ser: 1.17 mg/dL (ref 0.40–1.50)
GFR: 59.56 mL/min — ABNORMAL LOW (ref >60.00)
Glucose, Bld: 92 mg/dL (ref 70–99)
Potassium: 4.2 mEq/L (ref 3.5–5.1)
Sodium: 140 mEq/L (ref 135–145)
Total Bilirubin: 0.8 mg/dL (ref 0.2–1.2)
Total Protein: 6.7 g/dL (ref 6.0–8.3)

## 2022-07-15 LAB — POCT INFLUENZA A/B

## 2022-07-15 LAB — H. PYLORI ANTIBODY, IGG: H Pylori IgG: POSITIVE — AB

## 2022-07-15 LAB — POC COVID19 BINAXNOW: SARS Coronavirus 2 Ag: POSITIVE — AB

## 2022-07-15 MED ORDER — MOLNUPIRAVIR EUA 200MG CAPSULE: 4.0000 | ORAL_CAPSULE | Freq: Two times a day (BID) | ORAL | 0 refills | Status: AC

## 2022-07-15 NOTE — Patient Instructions (Addendum)
Flu and covid test today  Please stop by lab before you go If you have mychart- we will send your results within 3 business days of [place] receiving them.  If you do not have mychart- we will call you about results within 5 business days of [place] receiving them.  *please also note that you will see labs on mychart as soon as they post. I will later go in and write notes on them- will say "notes from Dr. Yong Channel"   Patient with testing confirming covid 19 with first day of covid 19 symptoms 07/11/22 Vaccination status: last in 2021   Therefore: - recommended patient watch closely for shortness of breath or confusion or worsening symptoms and if those occur patient should contact us immediately or seek care in the emergency department -recommended patient consider purchasing pulse oximeter and if levels 94% or below persistently- seek care at the hospital - Patient needs to self isolate  for at least 5 days since first symptom AND at least 24 hours fever free without fever reducing medications AND have improvement in respiratory symptoms . After 5 days can end self isolation but still needs to wear mask for additional 5 days . Today is day 4 of illness by official calculations.  -Patient should inform close contacts about exposure (anyone patient been around unmasked for more than 15 minutes)   If High risk for complications-we discussed outpatient therapeutic options including paxlovid, molnupiravir.  - patient opted for molnupiravir with limitations on paxlovid with his other meds  Recommended follow up: Return for as needed for new, worsening, persistent symptoms.

## 2022-07-15 NOTE — Progress Notes (Signed)
Phone [phone] In person visit   Subjective:   Richard Mahoney is a 77 y.o. year old very pleasant male patient who presents for/with See problem oriented charting Chief Complaint  Patient presents with   Follow-up    Here for follow up   Past Medical History-  Patient Active Problem List   Diagnosis Date Noted   History of prostate cancer 08/18/2014    Priority: High   Coronary atherosclerosis of native coronary artery 04/12/2012    Priority: High   Vitamin B12 deficiency 10/31/2017    Priority: Medium    GERD (gastroesophageal reflux disease) 03/17/2015    Priority: Medium    Hyperlipidemia 04/25/2012    Priority: Medium    Insomnia 04/22/2010    Priority: Low   NEPHROLITHIASIS, HX OF 03/26/2008    Priority: Low   Vitamin D deficiency 04/04/2022    Medications- reviewed and updated Current Outpatient Medications  Medication Sig Dispense Refill   atorvastatin (LIPITOR) 40 MG tablet TAKE 1 TABLET BY MOUTH DAILY 100 tablet 2   busPIRone (BUSPAR) 5 MG tablet Take 1 tablet (5 mg total) by mouth 2 (two) times daily as needed. 60 tablet 3   cholecalciferol (VITAMIN D3) 25 MCG (1000 UNIT) tablet Take 4,000 Units by mouth daily.     clopidogrel (PLAVIX) 75 MG tablet TAKE 1 TABLET BY MOUTH DAILY 90 tablet 0   cyanocobalamin 1000 MCG tablet Take 1,000 mcg by mouth daily.     nitroGLYCERIN (NITROSTAT) 0.4 MG SL tablet PLACE 1 TABLET UNDER THE TONGUE EVERY 5 MINUTES AS NEEDED FOR CHEST PAIN. 25 tablet 5   ondansetron (ZOFRAN) 4 MG tablet Take 1 tablet (4 mg total) by mouth every 8 (eight) hours as needed for nausea or vomiting. 10 tablet 0   pantoprazole (PROTONIX) 40 MG tablet TAKE 1 TABLET BY MOUTH DAILY 100 tablet 2   polyvinyl alcohol (LIQUIFILM TEARS) 1.4 % ophthalmic solution Place 1 drop into both eyes daily as needed for dry eyes.     sodium chloride (OCEAN) 0.65 % SOLN nasal spray Place 1 spray into both nostrils daily as needed for congestion.     traZODone (DESYREL)  50 MG tablet TAKE 1/2 TO 1 TABLET BY  MOUTH AT BEDTIME AS NEEDED  FOR SLEEP 90 tablet 3   No current facility-administered medications for this visit.     Objective:  BP 118/72 (BP Location: Right Arm, Patient Position: Sitting, Cuff Size: Normal)   Pulse 89   Temp (!) 97.5 F (36.4 C) (Oral)   Resp 16   Ht 6' (1.829 m)   Wt 190 lb (86.2 kg)   SpO2 96%   BMI 25.77 kg/m  Gen: NAD, appears more fatigued than usual TM normal, oropharynx largely normal. Nasal turbinates erythematous with some yellow discharge CV: RRR no murmurs rubs or gallops Lungs: CTAB no crackles, wheeze, rhonchi- some transmitted upper airway sounds which clear with cough Abdomen: soft/nontender/nondistended/normal bowel sounds. No rebound or guarding.  Ext: no edema Skin: warm, dry    Assessment and Plan   # GERD history- with indigestion/gasiness recurrently despite treatment S: Patient messaged me on 06/28/2022 after having hamburger steak, mashed potatoes and slaw and developing stomach cramps, belching sulfur taste and afterwards developing explosive diarrhea throughout the night and felt bad most of the next day with stomach pains but no diarrhea and a few days later had recurrent diarrhea and sulfur burps.  He was already taking pantoprazole 40 mg and I suggested could also add  Pepcid if needed for potential reflux element but this would still not explain diarrhea (could have had viral illness)  Patient messaged me again on 07/08/2022-had visited daughter Cyril Mourning was even a Christmas Day and when he returned home and had a piece of coconut cake and a glass of milk-later that evening developed stomach queasiness/nausea and had diarrhea and stomach pains through the rest of the night as well as worsening nausea and was in bed the entire next day would likely symptoms cleared.  On January 2 he wrote that he had had a lot of rumbling, indigestion, gas as well as heartburn despite taking the pantoprazole 40 mg  He  reports started back on pepcid last Saturday and no issues since starting back. He thinks has had 6 months of issues. Also taking metamucil- had some diarrhea yesterday.   No shortness of breath and no chest pain issues outside of indigestion- walks treadmill regularly without issues.   From visit 04/04/2022 "#Gallbladder polyp/right upper quadrant pain-patient presented with right upper quadrant pain in July with 3 separate episodes of loose stool and right upper quadrant pain after eating fatty foods or dairy-always occurred after meal -Offered surgical consult-patient has  wanted to monitor and see if symptoms are recurrent first  -has not had further issues and is monitoring diet a little more carefully" A/P: Patient with recurrent episodes of indigestion, gassiness, abdominal cramping that has responded nicely when he has on Pepcid to his pantoprazole 40 mg.  Possibly poorly controlled reflux-we opted to continue Pepcid with flares - With worsening we will test for H. pylori - We discussed GI consult if negative  He has had some recurrent episodes of diarrhea as well which I cannot fully explain-should not be associated with reflux.  Wonder if any relation to the gallbladder polyp if gallbladder is not emptying effectively-she would like to hold off on surgical consult still but may get GIs opinion and sees them   # cough/congestion S:starting on Monday got sick from wife- phlegm (brown/yellow) and cough have been noted. Has felt run down. No fever. Some chills. No shortness of breath. No sinus pain or tenderness. Wife tested negative for covid.   -had some old azithromycin that he has taken during this time. Today is best he has felt.  A/P: On day 5 of cough and congestion-will test for flu and COVID today.  Patient with testing confirming covid 19 with first day of covid 19 symptoms 07/11/22 Vaccination status: last in 2021   Therefore: - recommended patient watch closely for shortness of  breath or confusion or worsening symptoms and if those occur patient should contact us immediately or seek care in the emergency department -recommended patient consider purchasing pulse oximeter and if levels 94% or below persistently- seek care at the hospital - Patient needs to self isolate  for at least 5 days since first symptom AND at least 24 hours fever free without fever reducing medications AND have improvement in respiratory symptoms . After 5 days can end self isolation but still needs to wear mask for additional 5 days .  -Patient should inform close contacts about exposure (anyone patient been around unmasked for more than 15 minutes)   If High risk for complications-we discussed outpatient therapeutic options including paxlovid, molnupiravir.  - patient opted for molnupiravir with limitations on paxlovid with his other meds   Recommended follow up: Return for as needed for new, worsening, persistent symptoms. Future Appointments  Date Time Provider [place]  04/07/2023  10:00 AM Marin Olp, MD [facility]   Lab/Order associations:   ICD-10-CM   1. Gastroesophageal reflux disease without esophagitis  K21.9 CBC with Differential/Platelet    Comprehensive metabolic panel    2. Indigestion  K30 H. pylori antibody, IgG    3. Acute cough  R05.1      No orders of the defined types were placed in this encounter.   Return precautions advised.  Garret Reddish, MD

## 2022-07-16 ENCOUNTER — Other Ambulatory Visit: Payer: Self-pay | Admitting: Family Medicine

## 2022-07-16 MED ORDER — BISMUTH/METRONIDAZ/TETRACYCLIN 140-125-125 MG PO CAPS: 3.0000 | ORAL_CAPSULE | Freq: Four times a day (QID) | ORAL | 0 refills | Status: DC

## 2022-07-16 MED ORDER — PANTOPRAZOLE SODIUM 40 MG PO TBEC: 40.0000 mg | DELAYED_RELEASE_TABLET | Freq: Two times a day (BID) | ORAL | 3 refills | Status: DC

## 2022-07-18 ENCOUNTER — Encounter: Payer: Self-pay | Admitting: Cardiovascular Disease

## 2022-07-19 MED ORDER — TETRACYCLINE HCL 500 MG PO CAPS: 500.0000 mg | ORAL_CAPSULE | Freq: Four times a day (QID) | ORAL | 0 refills | Status: DC

## 2022-07-19 MED ORDER — BISMUTH SUBSALICYLATE 525 MG PO TABS: 1.0000 | ORAL_TABLET | Freq: Four times a day (QID) | ORAL | 0 refills | Status: DC

## 2022-07-19 MED ORDER — METRONIDAZOLE 250 MG PO TABS: 250.0000 mg | ORAL_TABLET | Freq: Four times a day (QID) | ORAL | 0 refills | Status: DC

## 2022-08-02 NOTE — Addendum Note (Signed)
Addended by: Marin Olp on: 08/02/2022 08:20 AM   Modules accepted: Orders

## 2022-08-02 NOTE — Telephone Encounter (Signed)
Richard Mahoney can you make sure with Lenna Sciara this is the preferred test (there is one for [facility] as well) for his stool antigen H. Pylori and also that she has the collection materials/kit?

## 2022-08-02 NOTE — Telephone Encounter (Signed)
I have given this to [facility] for her to look into.

## 2022-08-13 DIAGNOSIS — D485 Neoplasm of uncertain behavior of skin: Secondary | ICD-10-CM | POA: Diagnosis not present

## 2022-08-15 ENCOUNTER — Ambulatory Visit: Payer: Medicare Other | Attending: Cardiovascular Disease | Admitting: Cardiovascular Disease

## 2022-08-15 ENCOUNTER — Encounter: Payer: Self-pay | Admitting: Cardiovascular Disease

## 2022-08-15 VITALS — BP 140/80 | HR 81 | Ht 72.0 in | Wt 191.0 lb

## 2022-08-15 DIAGNOSIS — I2510 Atherosclerotic heart disease of native coronary artery without angina pectoris: Secondary | ICD-10-CM | POA: Diagnosis not present

## 2022-08-15 DIAGNOSIS — E7800 Pure hypercholesterolemia, unspecified: Secondary | ICD-10-CM | POA: Diagnosis not present

## 2022-08-15 NOTE — Progress Notes (Signed)
Chief Complaint  Patient presents with   Follow-up    CAD   History of Present Illness: 77 yo male with history of CAD, kidney stones, GERD here today for cardiac follow up. Cardiac cath September 2013 with severe mid LAD stenosis involving the Diagonal branch. The LAD was treated with a drug eluting stent and angioplasty was performed on the ostium of the diagonal. Recurrent chest pain November 2013 with cardiac cath 06/01/12 demonstrating stable disease with patency of LAD stent. Stress test in February 2017 in [place] and he had no ischemia. He was recently treated for H Pylori. Abdominal pain, nausea and diarrhea have resolved.   He is here today for follow up. The patient denies any chest pain, dyspnea, palpitations, lower extremity edema, orthopnea, PND, dizziness, near syncope or syncope.    Primary Care Physician: Marin Olp, MD  Past Medical History:  Diagnosis Date   Coronary artery disease    a. ETT 9/13: + ischemic ECG changes;  b. ETT-MV 9/13: EF 62%;  + ischemic EKG changes and + inf ischemia;  c. LHC 9/13: pLAD 30%, mLAD 70-80%, oDx 40-50% ==> FFR hemodynamically significant stenosis in mLAD ==> PCI: Xience Xpedition DES to mLAD; Cath 06/01/12   single vessel CAD w/ patent mid LAD stent and normal LV systolic function   Fibromyalgia    muscle aches in past in both forearms, before statin even   GERD (gastroesophageal reflux disease)    Hyperlipidemia LDL goal < 70    Nephrolithiasis    Testosterone deficiency 04/28/2008   History treatment around 2009. No current treatment.       Past Surgical History:  Procedure Laterality Date   CARDIAC CATHETERIZATION  04/06/2012; 06/01/2012   CATARACT EXTRACTION W/ INTRAOCULAR LENS IMPLANT  08/2011   right .Left 12/2014.    CORONARY ANGIOPLASTY WITH STENT PLACEMENT  04/11/2012   DES LAD   CYSTOSCOPY W/ STONE MANIPULATION  2009   INGUINAL HERNIA REPAIR  1990's   right   KNEE ARTHROSCOPY  1990's   right   LEFT HEART  CATHETERIZATION WITH CORONARY ANGIOGRAM N/A 06/01/2012   Procedure: LEFT HEART CATHETERIZATION WITH CORONARY ANGIOGRAM;  Surgeon: Burnell Blanks, MD;  Location: [facility] CATH LAB;  Service: Cardiovascular;  Laterality: N/A;   LYMPHADENECTOMY Bilateral 08/18/2014   Procedure: LYMPHADENECTOMY;  Surgeon: Raynelle Bring, MD;  Location: [facility] ORS;  Service: Urology;  Laterality: Bilateral;   PERCUTANEOUS CORONARY STENT INTERVENTION (PCI-S) N/A 04/11/2012   Procedure: PERCUTANEOUS CORONARY STENT INTERVENTION (PCI-S);  Surgeon: Burnell Blanks, MD;  Location: [facility] CATH LAB;  Service: Cardiovascular;  Laterality: N/A;   ROBOT ASSISTED LAPAROSCOPIC RADICAL PROSTATECTOMY N/A 08/18/2014   Procedure: ROBOTIC ASSISTED LAPAROSCOPIC RADICAL PROSTATECTOMY LEVEL 2;  Surgeon: Raynelle Bring, MD;  Location: [facility] ORS;  Service: Urology;  Laterality: N/A;    Current Outpatient Medications  Medication Sig Dispense Refill   atorvastatin (LIPITOR) 40 MG tablet TAKE 1 TABLET BY MOUTH DAILY 100 tablet 2   busPIRone (BUSPAR) 5 MG tablet Take 1 tablet (5 mg total) by mouth 2 (two) times daily as needed. 60 tablet 3   cholecalciferol (VITAMIN D3) 25 MCG (1000 UNIT) tablet Take 4,000 Units by mouth daily.     clopidogrel (PLAVIX) 75 MG tablet TAKE 1 TABLET BY MOUTH DAILY 90 tablet 0   cyanocobalamin 1000 MCG tablet Take 1,000 mcg by mouth daily.     nitroGLYCERIN (NITROSTAT) 0.4 MG SL tablet PLACE 1 TABLET UNDER THE TONGUE EVERY 5 MINUTES AS NEEDED FOR  CHEST PAIN. 25 tablet 5   pantoprazole (PROTONIX) 40 MG tablet TAKE 1 TABLET BY MOUTH DAILY 100 tablet 2   polyvinyl alcohol (LIQUIFILM TEARS) 1.4 % ophthalmic solution Place 1 drop into both eyes daily as needed for dry eyes.     sodium chloride (OCEAN) 0.65 % SOLN nasal spray Place 1 spray into both nostrils daily as needed for congestion.     traZODone (DESYREL) 50 MG tablet TAKE 1/2 TO 1 TABLET BY  MOUTH AT BEDTIME AS NEEDED  FOR SLEEP 90 tablet 3   chlorpheniramine (RA  CHLORPHENIRAMINE MALEATE) 4 MG tablet      No current facility-administered medications for this visit.    Allergies  Allergen Reactions   Sulfa Antibiotics Rash    rash    Social History   Socioeconomic History   Marital status: Married    Spouse name: Not on file   Number of children: 2   Years of education: Not on file   Highest education level: Not on file  Occupational History   Occupation: Retired  Tobacco Use   Smoking status: Never   Smokeless tobacco: Never  Substance and Sexual Activity   Alcohol use: No   Drug use: No   Sexual activity: Not Currently  Other Topics Concern   Not on file  Social History Narrative   Married (brother patient here, wife patient elsewhere). 2 children and 2 grandkids 4 and 2 in 2022 (daughter  in [place] with 2 kids, son in [place] with [employer]- Investment banker, corporate).       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)   -wife retiring 2022      Hobbies: golf, work in yard, walking with wife   Social Determinants of Health   Financial Resource Strain: [place]  (03/17/2022)   Overall Financial Resource Strain (CARDIA)    Difficulty of Paying Living Expenses: Not hard at all  Food Insecurity: No Food Insecurity (03/17/2022)   Hunger Vital Sign    Worried About Running Out of Food in the Last Year: Never true    [place] in the Last Year: Never true  Transportation Needs: No Transportation Needs (03/17/2022)   PRAPARE - Hydrologist (Medical): No    Lack of Transportation (Non-Medical): No  Physical Activity: Sufficiently Active (03/17/2022)   Exercise Vital Sign    Days of Exercise per Week: 5 days    Minutes of Exercise per Session: 30 min  Stress: No Stress Concern Present (03/17/2022)   [place]    Feeling of Stress : Not at all  Social Connections: Unknown (03/17/2022)    Social Connection and Isolation Panel [NHANES]    Frequency of Communication with Friends and Family: Not on file    Frequency of Social Gatherings with Friends and Family: Not on file    Attends Religious Services: Not on file    Active Member of Clubs or Organizations: Not on file    Attends Archivist Meetings: Not on file    Marital Status: Married  Intimate Partner Violence: Not At Risk (03/17/2022)   Humiliation, Afraid, Rape, and Kick questionnaire    Fear of Current or Ex-Partner: No    Emotionally Abused: No    Physically Abused: No    Sexually Abused: No    Family History  Problem Relation Age of Onset   Diabetes Mother  Heart attack Mother 43   Diabetes Father    Heart attack Father 23   Diabetes Sister    Arthritis Brother     Review of Systems:  As stated in the HPI and otherwise negative.   BP (!) 140/80   Pulse 81   Ht 6' (1.829 m)   Wt 86.6 kg   SpO2 96%   BMI 25.90 kg/m   Physical Examination: General: Well developed, well nourished, NAD  HEENT: OP clear, mucus membranes moist  SKIN: warm, dry. No rashes. Neuro: No focal deficits  Musculoskeletal: Muscle strength 5/5 all ext  Psychiatric: Mood and affect normal  Neck: No JVD, no carotid bruits, no thyromegaly, no lymphadenopathy.  Lungs:Clear bilaterally, no wheezes, rhonci, crackles Cardiovascular: Regular rate and rhythm. No murmurs, gallops or rubs. Abdomen:Soft. Bowel sounds present. Non-tender.  Extremities: No lower extremity edema. Pulses are 2 + in the bilateral DP/PT.  Cardiac cath 06/01/12:  Left main: No obstructive disease noted.  Left Anterior Descending Artery: Moderate caliber vessel that courses to the apex. The proximal vessel has a 30% stenosis. The mid vessel has a patent stented segment with no evidence of restenosis. The distal LAD has no obstructive disease. The diagonal branch is covered by the LAD stent and has a 10% ostial stenosis. There is excellent flow down the  diagonal branch.  Intermediate Artery: Small caliber vessel with 30% stenosis.  Circumflex Artery: Large caliber, co-dominant vessel with no obstructive disease noted. There are three small caliber obtuse marginal branches that have no obstructive disease noted.  Right Coronary Artery: Small caliber, co-dominant vessel with mild plaque in the proximal and mid segments.  Left Ventricular Angiogram: LVEF=60-65%   EKG:  EKG is ordered today. The ekg ordered today demonstrates NSR  Recent Labs: 07/15/2022: ALT 16; BUN 19; Creatinine, Ser 1.17; Hemoglobin 14.0; Platelets 259.0; Potassium 4.2; Sodium 140   Lipid Panel    Component Value Date/Time   CHOL 148 04/04/2022 1611   TRIG 164.0 (H) 04/04/2022 1611   HDL 48.30 04/04/2022 1611   CHOLHDL 3 04/04/2022 1611   VLDL 32.8 04/04/2022 1611   LDLCALC 67 04/04/2022 1611   LDLCALC 77 02/25/2020 0953   LDLDIRECT 86.0 10/16/2015 0841     Wt Readings from Last 3 Encounters:  08/15/22 86.6 kg  07/15/22 86.2 kg  04/04/22 86.4 kg    Assessment and Plan:   1. CAD without angina: He has no chest pain. He is not on a beta blocker due to bradycardia. Continue Plavix and statin. Will arrange echo to assess LV function.   2. HLD: Lipids are followed in primary care. LDL 67 in September 2023. Continue statin.    Labs/ tests ordered today include:   Orders Placed This Encounter  Procedures   EKG 12-Lead   ECHOCARDIOGRAM COMPLETE   Disposition:   F/U with me in 12  months  Signed, Lauree Chandler, MD 08/15/2022 3:47 PM    [facility] [address], [place], [place]  [address] Phone: [phone]; Fax: [phone]

## 2022-08-15 NOTE — Patient Instructions (Signed)
Medication Instructions:  No changes *If you need a refill on your cardiac medications before your next appointment, please call your pharmacy*   Lab Work: none   Testing/Procedures: Your physician has requested that you have an echocardiogram. Echocardiography is a painless test that uses sound waves to create images of your heart. It provides your doctor with information about the size and shape of your heart and how well your heart's chambers and valves are working. This procedure takes approximately one hour. There are no restrictions for this procedure. Please do NOT wear cologne, perfume, aftershave, or lotions (deodorant is allowed). Please arrive 15 minutes prior to your appointment time.    Follow-Up: At [facility], you and your health needs are our priority.  As part of our continuing mission to provide you with exceptional heart care, we have created designated Provider Care Teams.  These Care Teams include your primary Cardiologist (physician) and Advanced Practice Providers (APPs -  Physician Assistants and Nurse Practitioners) who all work together to provide you with the care you need, when you need it.    Your next appointment:   12 month(s)  Provider:   Christopher McAlhany, MD     

## 2022-08-30 DIAGNOSIS — Z0100 Encounter for examination of eyes and vision without abnormal findings: Secondary | ICD-10-CM | POA: Diagnosis not present

## 2022-09-09 ENCOUNTER — Telehealth: Payer: Self-pay | Admitting: Family Medicine

## 2022-09-09 ENCOUNTER — Other Ambulatory Visit: Payer: Self-pay | Admitting: Family Medicine

## 2022-09-09 NOTE — Telephone Encounter (Signed)
Patient stated  he cannot drive to [place] from [place]- pt was given the option of doing a virtual appointment today patient declined. Patient will call office should he need to come in

## 2022-09-09 NOTE — Telephone Encounter (Signed)
Patient states he has pink eye-requests RX for pink eye to be sent to:  [id number] [place] [facility] [id number] Phone: [phone]  Fax: [phone]    Patient declined Office Visit

## 2022-09-09 NOTE — Telephone Encounter (Signed)
Please schedule ov in order for medication to be received.

## 2022-09-15 ENCOUNTER — Encounter: Payer: Self-pay | Admitting: Family Medicine

## 2022-09-15 DIAGNOSIS — A048 Other specified bacterial intestinal infections: Secondary | ICD-10-CM | POA: Diagnosis not present

## 2022-09-16 ENCOUNTER — Other Ambulatory Visit: Payer: Medicare Other

## 2022-09-16 DIAGNOSIS — A048 Other specified bacterial intestinal infections: Secondary | ICD-10-CM

## 2022-09-16 LAB — HELICOBACTER PYLORI  SPECIAL ANTIGEN
MICRO NUMBER:: 14662760
SPECIMEN QUALITY: ADEQUATE

## 2022-09-28 ENCOUNTER — Ambulatory Visit: Payer: Medicare Other | Attending: Cardiovascular Disease

## 2022-09-28 DIAGNOSIS — I2510 Atherosclerotic heart disease of native coronary artery without angina pectoris: Secondary | ICD-10-CM | POA: Diagnosis not present

## 2022-09-28 LAB — ECHOCARDIOGRAM COMPLETE: S' Lateral: 3.1 cm

## 2022-10-03 ENCOUNTER — Encounter: Payer: Self-pay | Admitting: Cardiovascular Disease

## 2022-10-28 ENCOUNTER — Other Ambulatory Visit: Payer: Self-pay | Admitting: Cardiovascular Disease

## 2022-10-28 ENCOUNTER — Other Ambulatory Visit: Payer: Self-pay | Admitting: Family Medicine

## 2022-11-08 ENCOUNTER — Encounter: Payer: Self-pay | Admitting: Family Medicine

## 2022-11-09 ENCOUNTER — Encounter: Payer: Self-pay | Admitting: Family Medicine

## 2022-11-11 ENCOUNTER — Encounter: Payer: Self-pay | Admitting: Family Medicine

## 2022-11-11 ENCOUNTER — Ambulatory Visit (INDEPENDENT_AMBULATORY_CARE_PROVIDER_SITE_OTHER): Payer: Medicare Other | Admitting: Family Medicine

## 2022-11-11 VITALS — BP 138/72 | HR 65 | Temp 97.2°F | Ht 72.0 in | Wt 192.4 lb

## 2022-11-11 DIAGNOSIS — R8290 Unspecified abnormal findings in urine: Secondary | ICD-10-CM | POA: Diagnosis not present

## 2022-11-11 DIAGNOSIS — R197 Diarrhea, unspecified: Secondary | ICD-10-CM

## 2022-11-11 DIAGNOSIS — R109 Unspecified abdominal pain: Secondary | ICD-10-CM | POA: Diagnosis not present

## 2022-11-11 LAB — CBC WITH DIFFERENTIAL/PLATELET
Basophils Absolute: 0 10*3/uL (ref 0.0–0.1)
Basophils Relative: 0.7 % (ref 0.0–3.0)
Eosinophils Absolute: 0.5 10*3/uL (ref 0.0–0.7)
Eosinophils Relative: 8.5 % — ABNORMAL HIGH (ref 0.0–5.0)
HCT: 40.2 % (ref 39.0–52.0)
Hemoglobin: 13.8 g/dL (ref 13.0–17.0)
Lymphocytes Relative: 35 % (ref 12.0–46.0)
Lymphs Abs: 2 10*3/uL (ref 0.7–4.0)
MCHC: 34.3 g/dL (ref 30.0–36.0)
MCV: 90.6 fl (ref 78.0–100.0)
Monocytes Absolute: 0.4 10*3/uL (ref 0.1–1.0)
Monocytes Relative: 7.8 % (ref 3.0–12.0)
Neutro Abs: 2.8 10*3/uL (ref 1.4–7.7)
Neutrophils Relative %: 48 % (ref 43.0–77.0)
Platelets: 251 10*3/uL (ref 150.0–400.0)
RBC: 4.44 Mil/uL (ref 4.22–5.81)
RDW: 12.8 % (ref 11.5–15.5)
WBC: 5.8 10*3/uL (ref 4.0–10.5)

## 2022-11-11 LAB — URINALYSIS, ROUTINE W REFLEX MICROSCOPIC
Bilirubin Urine: NEGATIVE
Hgb urine dipstick: NEGATIVE
Leukocytes,Ua: NEGATIVE
Nitrite: NEGATIVE
Specific Gravity, Urine: >=1.03 — AB (ref 1.000–1.030)
Total Protein, Urine: NEGATIVE
Urine Glucose: NEGATIVE
Urobilinogen, UA: 0.2 (ref 0.0–1.0)
pH: 5.5 (ref 5.0–8.0)

## 2022-11-11 LAB — COMPREHENSIVE METABOLIC PANEL
ALT: 18 U/L (ref 0–53)
AST: 21 U/L (ref 0–37)
Albumin: 4.1 g/dL (ref 3.5–5.2)
Alkaline Phosphatase: 72 U/L (ref 39–117)
BUN: 16 mg/dL (ref 6–23)
CO2: 29 mEq/L (ref 19–32)
Calcium: 9.1 mg/dL (ref 8.4–10.5)
Chloride: 104 mEq/L (ref 96–112)
Creatinine, Ser: 1.19 mg/dL (ref 0.40–1.50)
GFR: 58.23 mL/min — ABNORMAL LOW (ref >60.00)
Glucose, Bld: 88 mg/dL (ref 70–99)
Potassium: 4 mEq/L (ref 3.5–5.1)
Sodium: 141 mEq/L (ref 135–145)
Total Bilirubin: 0.8 mg/dL (ref 0.2–1.2)
Total Protein: 6.7 g/dL (ref 6.0–8.3)

## 2022-11-11 LAB — LIPASE: Lipase: 23 U/L (ref 11.0–59.0)

## 2022-11-11 NOTE — Progress Notes (Signed)
Phone [phone] In person visit   Subjective:   Richard Mahoney is a 77 y.o. year old very pleasant male patient who presents for/with See problem oriented charting Chief Complaint  Patient presents with   GI issues    Reference mychart convos   Past Medical History-  Patient Active Problem List   Diagnosis Date Noted   History of prostate cancer 08/18/2014    Priority: High   Coronary atherosclerosis of native coronary artery 04/12/2012    Priority: High   Vitamin B12 deficiency 10/31/2017    Priority: Medium    GERD (gastroesophageal reflux disease) 03/17/2015    Priority: Medium    Hyperlipidemia 04/25/2012    Priority: Medium    Insomnia 04/22/2010    Priority: Low   NEPHROLITHIASIS, HX OF 03/26/2008    Priority: Low   Vitamin D deficiency 04/04/2022    Medications- reviewed and updated Current Outpatient Medications  Medication Sig Dispense Refill   atorvastatin (LIPITOR) 40 MG tablet TAKE 1 TABLET BY MOUTH DAILY 100 tablet 2   busPIRone (BUSPAR) 5 MG tablet TAKE 1 TABLET BY MOUTH TWICE  DAILY AS NEEDED 200 tablet 0   chlorpheniramine (RA CHLORPHENIRAMINE MALEATE) 4 MG tablet      cholecalciferol (VITAMIN D3) 25 MCG (1000 UNIT) tablet Take 4,000 Units by mouth daily.     clopidogrel (PLAVIX) 75 MG tablet TAKE 1 TABLET BY MOUTH DAILY 90 tablet 2   cyanocobalamin 1000 MCG tablet Take 1,000 mcg by mouth daily.     nitroGLYCERIN (NITROSTAT) 0.4 MG SL tablet PLACE 1 TABLET UNDER THE TONGUE EVERY 5 MINUTES AS NEEDED FOR CHEST PAIN. 25 tablet 5   pantoprazole (PROTONIX) 40 MG tablet TAKE 1 TABLET BY MOUTH DAILY 100 tablet 2   polyvinyl alcohol (LIQUIFILM TEARS) 1.4 % ophthalmic solution Place 1 drop into both eyes daily as needed for dry eyes.     sodium chloride (OCEAN) 0.65 % SOLN nasal spray Place 1 spray into both nostrils daily as needed for congestion.     traZODone (DESYREL) 50 MG tablet TAKE 1/2 TO 1 TABLET BY MOUTH AT BEDTIME AS NEEDED FOR SLEEP 90 tablet 3    No current facility-administered medications for this visit.     Objective:  BP 138/72   Pulse 65   Temp (!) 97.2 F (36.2 C)   Ht 6' (1.829 m)   Wt 192 lb 6.4 oz (87.3 kg)   SpO2 96%   BMI 26.09 kg/m  Gen: NAD, resting comfortably CV: RRR no murmurs rubs or gallops Lungs: CTAB no crackles, wheeze, rhonchi Abdomen: soft/nontender across upper abdomen but approximately periumbilical and below with mild to moderate tenderness-unable to reproduce more significant left lower quadrant pain previously experienced/nondistended/normal bowel sounds. No rebound or guarding.  Ext: no edema Skin: warm, dry     Assessment and Plan    # Lower abdominal pain/diarrhea/vomiting S: Patient completed quadruple therapy for H. pylori on January 30 and later retested and was negative.   Approximately a week ago patient started to experience severe stomach cramps, vomiting and uncontrollable diarrhea after eating out at a buffet a the day before (small piece of chicken, small amount of fried pork chop, fried okra- didn't overeat - Noted stomach pains, loud gurgling at rest, projectile vomiting about 4 times and explosive diarrhea the rest of the night and all day Saturday and Sunday afternoon.  Thankfully vomiting was only on Friday night.  Felt like he tasted bile - Strongly consider going to the  hospital - He reached out earlier this week with lingering general soreness in abdomen and low appetite. Stool foul smelling and "army green"  He discussed with his gastroenterologist and they discussed doing a HIDA scan but they could not get him in for a while.  In the early morning on May 1 experienced more stomach cramping and diarrhea. One time did have more significant sharp LLQ pain.  -has felt bad this entire time with lower abdominal cramping- not RUQ pain - has felt really weak/run down/nauseous like he could throw up at anytime without actually throwing up -has improved diet and feels helpful  but still doesn't feel great -foods in stool seemed to have quickly passed.  - also having some odor to urine- no increased urination or dysuria - no chest pain or shortness of breath. No bright red blood per rectum or melena.  -last diarrhe was midweek -gradually improving after midweek - no fever subjectively. No right upper quadrant of abdomen pain  A/P: 77 year old male treated for H. pylori with quadruple therapy earlier this year with sudden onset lower abdominal cramping, vomiting, diarrhea starting about a week ago with vomiting and diarrhea improving but lower abdominal cramping improving but be more persistent - I am concerned about a viral or bacterial gastroenteritis-diarrhea has slowed but if recurrent want him to test for C. difficile and GI pathogen panel - Also small potential for food poisoning but seems to be improving at this point - With more persistent lower abdominal pain though rather diffuse wonder about diverticulitis-we discussed if fails to continue to improve would want to seek imaging of the lower abdomen with CT abdomen pelvis to evaluate for diverticulitis -No chest pain or shortness of breath and pain tends to be in the lower abdomen-think less likely to be cardiac - Also no right upper quadrant pain and I think the HIDA scan suggested by GI likely less beneficial at this point (but we discussed may need to pursue later) -some urinary odor- also check urinalysis - think urinary tract infection (UTI) unlikely- but pursue if concerns on UA   Recommended follow up: Return for as needed for new, worsening, persistent symptoms. Future Appointments  Date Time Provider Department Center  04/07/2023 10:00 AM Shelva Majestic, MD [facility]   Lab/Order associations:   ICD-10-CM   1. Diarrhea of presumed infectious origin  R19.7 C. difficile GDH and Toxin A/B    Gastrointestinal Pathogen Pnl RT, PCR    CANCELED: C. difficile GDH and Toxin A/B    CANCELED:  Gastrointestinal Pathogen Pnl RT, PCR    2. Abdominal cramping  R10.9 Comprehensive metabolic panel    CBC with Differential/Platelet    Lipase    3. Abnormal urine odor  R82.90 Urinalysis, Routine w reflex microscopic     Time Spent: 43 minutes of total time (12:14 PM-12:54 PM, 8:00-8:03 PM) was spent on the date of the encounter performing the following actions: chart review prior to seeing the patient, obtaining history, performing a medically necessary exam, counseling on the potential causes of his symptoms as well as potential treatment plan, placing orders, and documenting in our EHR.    Return precautions advised.  Tana Conch, MD

## 2022-11-11 NOTE — Patient Instructions (Addendum)
Make sure to get stool collection samples- only collect diarrhea  Other thing im worried about is diverticulitis- if we do not find answers on labs and diarrhea resolves but pain continues would likely want to get CT scan even if labs reassuring   Please stop by lab before you go If you have mychart- we will send your results within 3 business days of [place] receiving them.  If you do not have mychart- we will call you about results within 5 business days of [place] receiving them.  *please also note that you will see labs on mychart as soon as they post. I will later go in and write notes on them- will say "notes from Dr. Durene Cal"   Recommended follow up: Return for as needed for new, worsening, persistent symptoms.

## 2022-11-14 ENCOUNTER — Other Ambulatory Visit: Payer: Self-pay

## 2022-11-14 DIAGNOSIS — R8271 Bacteriuria: Secondary | ICD-10-CM

## 2022-11-14 DIAGNOSIS — R197 Diarrhea, unspecified: Secondary | ICD-10-CM | POA: Diagnosis not present

## 2022-11-14 DIAGNOSIS — R8290 Unspecified abnormal findings in urine: Secondary | ICD-10-CM

## 2022-11-15 LAB — C. DIFFICILE GDH AND TOXIN A/B
GDH ANTIGEN: NOT DETECTED
MICRO NUMBER:: 14917656
SPECIMEN QUALITY:: ADEQUATE
TOXIN A AND B: NOT DETECTED

## 2022-11-16 LAB — GASTROINTESTINAL PATHOGEN PNL
CampyloBacter Group: NOT DETECTED
Norovirus GI/GII: NOT DETECTED
Rotavirus A: NOT DETECTED
Salmonella species: NOT DETECTED
Shiga Toxin 1: NOT DETECTED
Shiga Toxin 2: NOT DETECTED
Shigella Species: NOT DETECTED
Vibrio Group: NOT DETECTED
Yersinia enterocolitica: NOT DETECTED

## 2022-11-25 ENCOUNTER — Other Ambulatory Visit: Payer: Medicare Other

## 2022-11-25 ENCOUNTER — Encounter: Payer: Self-pay | Admitting: Family Medicine

## 2022-11-25 DIAGNOSIS — R8290 Unspecified abnormal findings in urine: Secondary | ICD-10-CM | POA: Diagnosis not present

## 2022-11-25 DIAGNOSIS — R8271 Bacteriuria: Secondary | ICD-10-CM | POA: Diagnosis not present

## 2022-11-26 LAB — URINE CULTURE
MICRO NUMBER:: 14971643
SPECIMEN QUALITY:: ADEQUATE

## 2022-11-27 ENCOUNTER — Encounter: Payer: Self-pay | Admitting: Family Medicine

## 2022-11-27 DIAGNOSIS — R1011 Right upper quadrant pain: Secondary | ICD-10-CM

## 2022-12-19 NOTE — Telephone Encounter (Signed)
Disregard the below message, Misty Stanley was confused. However, there is no HIDA scan that we can see that was ordered for this pt for tomorrow.

## 2022-12-19 NOTE — Telephone Encounter (Signed)
Pt would like a call back with regards to the PA needed.

## 2022-12-20 ENCOUNTER — Encounter (HOSPITAL_COMMUNITY): Payer: Medicare Other

## 2022-12-20 ENCOUNTER — Encounter (HOSPITAL_COMMUNITY): Payer: Self-pay

## 2022-12-20 NOTE — Telephone Encounter (Signed)
This has been handled.

## 2022-12-29 ENCOUNTER — Encounter (HOSPITAL_COMMUNITY): Payer: Medicare Other

## 2023-01-02 ENCOUNTER — Encounter (HOSPITAL_COMMUNITY)
Admission: RE | Admit: 2023-01-02 | Discharge: 2023-01-02 | Disposition: A | Discharge status: Home / Self Care | Payer: Medicare Other | Source: Ambulatory Visit | Attending: Family Medicine | Admitting: Family Medicine

## 2023-01-02 ENCOUNTER — Encounter: Payer: Self-pay | Admitting: Family Medicine

## 2023-01-02 DIAGNOSIS — R1011 Right upper quadrant pain: Secondary | ICD-10-CM | POA: Diagnosis not present

## 2023-01-02 DIAGNOSIS — R109 Unspecified abdominal pain: Secondary | ICD-10-CM | POA: Diagnosis not present

## 2023-01-02 MED ORDER — TECHNETIUM TC 99M MEBROFENIN IV KIT
5.5000 | PACK | Freq: Once | INTRAVENOUS | Status: AC | PRN
  Administered 2023-01-02: 5.5 via INTRAVENOUS

## 2023-01-08 ENCOUNTER — Encounter: Payer: Self-pay | Admitting: Family Medicine

## 2023-01-19 ENCOUNTER — Encounter: Payer: Self-pay | Admitting: Family Medicine

## 2023-01-23 DIAGNOSIS — R112 Nausea with vomiting, unspecified: Secondary | ICD-10-CM | POA: Diagnosis not present

## 2023-01-23 DIAGNOSIS — R197 Diarrhea, unspecified: Secondary | ICD-10-CM | POA: Diagnosis not present

## 2023-01-27 ENCOUNTER — Encounter: Payer: Self-pay | Admitting: Cardiovascular Disease

## 2023-01-27 ENCOUNTER — Encounter: Payer: Self-pay | Admitting: Family Medicine

## 2023-01-31 MED ORDER — EPINEPHRINE 0.3 MG/0.3ML IJ SOAJ: 0.3000 mg | INTRAMUSCULAR | 1 refills | Status: AC | PRN

## 2023-02-17 ENCOUNTER — Other Ambulatory Visit: Payer: Self-pay | Admitting: Family Medicine

## 2023-02-25 ENCOUNTER — Encounter: Payer: Self-pay | Admitting: Family Medicine

## 2023-02-27 NOTE — Telephone Encounter (Signed)
Please schedule ov with available provider for urine issues.

## 2023-02-27 NOTE — Telephone Encounter (Signed)
LVM to schedule ov with any available provider

## 2023-02-28 ENCOUNTER — Ambulatory Visit (INDEPENDENT_AMBULATORY_CARE_PROVIDER_SITE_OTHER): Payer: Medicare Other | Admitting: Family

## 2023-02-28 VITALS — BP 132/85 | HR 62 | Temp 97.7°F | Ht 72.0 in | Wt 190.8 lb

## 2023-02-28 DIAGNOSIS — N3944 Nocturnal enuresis: Secondary | ICD-10-CM | POA: Diagnosis not present

## 2023-02-28 LAB — POCT URINALYSIS DIPSTICK
Bilirubin, UA: NEGATIVE
Blood, UA: NEGATIVE
Glucose, UA: NEGATIVE
Ketones, UA: NEGATIVE
Leukocytes, UA: NEGATIVE
Nitrite, UA: NEGATIVE
Protein, UA: NEGATIVE
Spec Grav, UA: >=1.03 — AB (ref 1.010–1.025)
Urobilinogen, UA: 0.2 E.U./dL
pH, UA: 6 (ref 5.0–8.0)

## 2023-02-28 NOTE — Progress Notes (Signed)
Patient ID: Richard Mahoney, male    DOB: [birth year], 77 y.o.   MRN: [id number]  Chief Complaint  Patient presents with   Urinary Frequency    Pt c/o urinary frequency at night with blood for 3 months, urine has a foul odor. Pt also c/o dysuria and slight itching at times.    HPI:      Urinary sx:  hx of prostate sgy in 2016. Since then he has done well, but states over the last 2-3 mos he has noticed a small amount of urine leakage with a dark tinge to it in his underwear overnight. Pt states he is uncircumcised, but has never had problems with yeast in past. Reports occ. dysuria & itching. During the day he reports his urine is usually dark yellow, admits to not drinking enough water.  Assessment & Plan:  1. Nocturnal enuresis UA wnl. Advised he can use a thin layer of generic OTC Lotrimin cream on end of penis where irritated or itching. Advised on drinking at least 8 cups of water daily. Offered Urology referral vs watchful waiting, pt prefers to wait and let us know if problem persists.  - POCT Urinalysis Dipstick   Subjective:    Outpatient Medications Prior to Visit  Medication Sig Dispense Refill   atorvastatin (LIPITOR) 40 MG tablet TAKE 1 TABLET BY MOUTH DAILY 100 tablet 2   busPIRone (BUSPAR) 5 MG tablet TAKE 1 TABLET BY MOUTH TWICE  DAILY AS NEEDED 200 tablet 0   chlorpheniramine (RA CHLORPHENIRAMINE MALEATE) 4 MG tablet      cholecalciferol (VITAMIN D3) 25 MCG (1000 UNIT) tablet Take 4,000 Units by mouth daily.     clopidogrel (PLAVIX) 75 MG tablet TAKE 1 TABLET BY MOUTH DAILY 90 tablet 2   cyanocobalamin 1000 MCG tablet Take 1,000 mcg by mouth daily.     EPINEPHrine 0.3 mg/0.3 mL IJ SOAJ injection Inject 0.3 mg into the muscle as needed for anaphylaxis. 2 each 1   nitroGLYCERIN (NITROSTAT) 0.4 MG SL tablet PLACE 1 TABLET UNDER THE TONGUE EVERY 5 MINUTES AS NEEDED FOR CHEST PAIN. 25 tablet 5   pantoprazole (PROTONIX) 40 MG tablet TAKE 1 TABLET BY MOUTH DAILY 100 tablet 2    polyvinyl alcohol (LIQUIFILM TEARS) 1.4 % ophthalmic solution Place 1 drop into both eyes daily as needed for dry eyes.     sodium chloride (OCEAN) 0.65 % SOLN nasal spray Place 1 spray into both nostrils daily as needed for congestion.     traZODone (DESYREL) 50 MG tablet TAKE 1/2 TO 1 TABLET BY MOUTH AT BEDTIME AS NEEDED FOR SLEEP 90 tablet 3   No facility-administered medications prior to visit.   Past Medical History:  Diagnosis Date   Coronary artery disease    a. ETT 9/13: + ischemic ECG changes;  b. ETT-MV 9/13: EF 62%;  + ischemic EKG changes and + inf ischemia;  c. LHC 9/13: pLAD 30%, mLAD 70-80%, oDx 40-50% ==> FFR hemodynamically significant stenosis in mLAD ==> PCI: Xience Xpedition DES to mLAD; Cath 06/01/12   single vessel CAD w/ patent mid LAD stent and normal LV systolic function   Fibromyalgia    muscle aches in past in both forearms, before statin even   GERD (gastroesophageal reflux disease)    Hyperlipidemia LDL goal < 70    Nephrolithiasis    Testosterone deficiency 04/28/2008   History treatment around 2009. No current treatment.      Past Surgical History:  Procedure Laterality Date  CARDIAC CATHETERIZATION  04/06/2012; 06/01/2012   CATARACT EXTRACTION W/ INTRAOCULAR LENS IMPLANT  08/2011   right .Left 12/2014.    CORONARY ANGIOPLASTY WITH STENT PLACEMENT  04/11/2012   DES LAD   CYSTOSCOPY W/ STONE MANIPULATION  2009   INGUINAL HERNIA REPAIR  1990's   right   KNEE ARTHROSCOPY  1990's   right   LEFT HEART CATHETERIZATION WITH CORONARY ANGIOGRAM N/A 06/01/2012   Procedure: LEFT HEART CATHETERIZATION WITH CORONARY ANGIOGRAM;  Surgeon: Kathleene Hazel, MD;  [facility] CATH LAB;  Service: Cardiovascular;  Laterality: N/A;   LYMPHADENECTOMY Bilateral 08/18/2014   Procedure: LYMPHADENECTOMY;  Surgeon: Heloise Purpura, MD;  Location: [facility] ORS;  Service: Urology;  Laterality: Bilateral;   PERCUTANEOUS CORONARY STENT INTERVENTION (PCI-S) N/A 04/11/2012   Procedure:  PERCUTANEOUS CORONARY STENT INTERVENTION (PCI-S);  Surgeon: Kathleene Hazel, MD;  Location: [facility] CATH LAB;  Service: Cardiovascular;  Laterality: N/A;   ROBOT ASSISTED LAPAROSCOPIC RADICAL PROSTATECTOMY N/A 08/18/2014   Procedure: ROBOTIC ASSISTED LAPAROSCOPIC RADICAL PROSTATECTOMY LEVEL 2;  Surgeon: Heloise Purpura, MD;  Location: [facility];  Service: Urology;  Laterality: N/A;   Allergies  Allergen Reactions   Sulfa Antibiotics Rash    rash      Objective:    Physical Exam Vitals and nursing note reviewed.  Constitutional:      General: He is not in acute distress.    Appearance: Normal appearance.  HENT:     Head: Normocephalic.  Cardiovascular:     Rate and Rhythm: Normal rate and regular rhythm.  Pulmonary:     Effort: Pulmonary effort is normal.     Breath sounds: Normal breath sounds.  Musculoskeletal:        General: Normal range of motion.     Cervical back: Normal range of motion.  Skin:    General: Skin is warm and dry.  Neurological:     Mental Status: He is alert and oriented to person, place, and time.  Psychiatric:        Mood and Affect: Mood normal.    BP 132/85   Pulse 62   Temp 97.7 F (36.5 C) (Temporal)   Ht 6' (1.829 m)   Wt 190 lb 12.8 oz (86.5 kg)   SpO2 98%   BMI 25.88 kg/m  Wt Readings from Last 3 Encounters:  02/28/23 190 lb 12.8 oz (86.5 kg)  11/11/22 192 lb 6.4 oz (87.3 kg)  08/15/22 191 lb (86.6 kg)       Dulce Sellar, NP

## 2023-03-30 ENCOUNTER — Other Ambulatory Visit: Payer: Self-pay | Admitting: Cardiovascular Disease

## 2023-04-03 ENCOUNTER — Ambulatory Visit (INDEPENDENT_AMBULATORY_CARE_PROVIDER_SITE_OTHER): Payer: Medicare Other | Admitting: Family Medicine

## 2023-04-03 ENCOUNTER — Encounter: Payer: Self-pay | Admitting: Family Medicine

## 2023-04-03 VITALS — BP 132/70 | HR 67 | Temp 98.0°F | Ht 72.0 in | Wt 194.8 lb

## 2023-04-03 DIAGNOSIS — Z0000 Encounter for general adult medical examination without abnormal findings: Secondary | ICD-10-CM | POA: Diagnosis not present

## 2023-04-03 DIAGNOSIS — Z23 Encounter for immunization: Secondary | ICD-10-CM | POA: Diagnosis not present

## 2023-04-03 DIAGNOSIS — E538 Deficiency of other specified B group vitamins: Secondary | ICD-10-CM | POA: Diagnosis not present

## 2023-04-03 DIAGNOSIS — Z125 Encounter for screening for malignant neoplasm of prostate: Secondary | ICD-10-CM

## 2023-04-03 DIAGNOSIS — E559 Vitamin D deficiency, unspecified: Secondary | ICD-10-CM

## 2023-04-03 DIAGNOSIS — E785 Hyperlipidemia, unspecified: Secondary | ICD-10-CM

## 2023-04-03 NOTE — Progress Notes (Signed)
Phone: [phone]   Subjective:  Patient presents today for their annual physical. Chief complaint-noted.   See problem oriented charting- ROS- full  review of systems was completed and negative  Per full ROS sheet completed by patient  The following were reviewed and entered/updated in epic: Past Medical History:  Diagnosis Date   Coronary artery disease    a. ETT 9/13: + ischemic ECG changes;  b. ETT-MV 9/13: EF 62%;  + ischemic EKG changes and + inf ischemia;  c. LHC 9/13: pLAD 30%, mLAD 70-80%, oDx 40-50% ==> FFR hemodynamically significant stenosis in mLAD ==> PCI: Xience Xpedition DES to mLAD; Cath 06/01/12   single vessel CAD w/ patent mid LAD stent and normal LV systolic function   Fibromyalgia    muscle aches in past in both forearms, before statin even   GERD (gastroesophageal reflux disease)    Hyperlipidemia LDL goal < 70    Nephrolithiasis    Testosterone deficiency 04/28/2008   History treatment around 2009. No current treatment.      Patient Active Problem List   Diagnosis Date Noted   History of prostate cancer 08/18/2014    Priority: High   Coronary atherosclerosis of native coronary artery 04/12/2012    Priority: High   GERD (gastroesophageal reflux disease) 03/17/2015    Priority: Medium    Hyperlipidemia 04/25/2012    Priority: Medium    Insomnia 04/22/2010    Priority: Low   NEPHROLITHIASIS, HX OF 03/26/2008    Priority: Low   Past Surgical History:  Procedure Laterality Date   CARDIAC CATHETERIZATION  04/06/2012; 06/01/2012   CATARACT EXTRACTION W/ INTRAOCULAR LENS IMPLANT  08/2011   right .Left 12/2014.    CORONARY ANGIOPLASTY WITH STENT PLACEMENT  04/11/2012   DES LAD   CYSTOSCOPY W/ STONE MANIPULATION  2009   INGUINAL HERNIA REPAIR  1990's   right   KNEE ARTHROSCOPY  1990's   right   LEFT HEART CATHETERIZATION WITH CORONARY ANGIOGRAM N/A 06/01/2012   Procedure: LEFT HEART CATHETERIZATION WITH CORONARY ANGIOGRAM;  Surgeon: Kathleene Hazel, MD;  Location: [facility] CATH LAB;  Service: Cardiovascular;  Laterality: N/A;   LYMPHADENECTOMY Bilateral 08/18/2014   Procedure: LYMPHADENECTOMY;  Surgeon: Heloise Purpura, MD;  Location: [facility];  Service: Urology;  Laterality: Bilateral;   PERCUTANEOUS CORONARY STENT INTERVENTION (PCI-S) N/A 04/11/2012   Procedure: PERCUTANEOUS CORONARY STENT INTERVENTION (PCI-S);  Surgeon: Kathleene Hazel, MD;  Location: [facility] CATH LAB;  Service: Cardiovascular;  Laterality: N/A;   ROBOT ASSISTED LAPAROSCOPIC RADICAL PROSTATECTOMY N/A 08/18/2014   Procedure: ROBOTIC ASSISTED LAPAROSCOPIC RADICAL PROSTATECTOMY LEVEL 2;  Surgeon: Heloise Purpura, MD;  Location: [facility];  Service: Urology;  Laterality: N/A;    Family History  Problem Relation Age of Onset   Diabetes Mother    Heart attack Mother 42   Diabetes Father    Heart attack Father 60   Diabetes Sister    Arthritis Brother     Medications- reviewed and updated Current Outpatient Medications  Medication Sig Dispense Refill   atorvastatin (LIPITOR) 40 MG tablet TAKE 1 TABLET BY MOUTH DAILY 100 tablet 2   busPIRone (BUSPAR) 5 MG tablet TAKE 1 TABLET BY MOUTH TWICE  DAILY AS NEEDED 200 tablet 0   chlorpheniramine (RA CHLORPHENIRAMINE MALEATE) 4 MG tablet      cholecalciferol (VITAMIN D3) 25 MCG (1000 UNIT) tablet Take 4,000 Units by mouth daily.     clopidogrel (PLAVIX) 75 MG tablet TAKE 1 TABLET BY MOUTH DAILY 90 tablet 2  cyanocobalamin 1000 MCG tablet Take 1,000 mcg by mouth daily.     pantoprazole (PROTONIX) 40 MG tablet TAKE 1 TABLET BY MOUTH DAILY 100 tablet 2   polyvinyl alcohol (LIQUIFILM TEARS) 1.4 % ophthalmic solution Place 1 drop into both eyes daily as needed for dry eyes.     sodium chloride (OCEAN) 0.65 % SOLN nasal spray Place 1 spray into both nostrils daily as needed for congestion.     traZODone (DESYREL) 50 MG tablet TAKE 1/2 TO 1 TABLET BY MOUTH AT BEDTIME AS NEEDED FOR SLEEP 90 tablet 3   EPINEPHrine 0.3 mg/0.3 mL IJ SOAJ  injection Inject 0.3 mg into the muscle as needed for anaphylaxis. (Patient not taking: Reported on 04/03/2023) 2 each 1   nitroGLYCERIN (NITROSTAT) 0.4 MG SL tablet PLACE 1 TABLET UNDER THE TONGUE EVERY 5 MINUTES AS NEEDED FOR CHEST PAIN. (Patient not taking: Reported on 04/03/2023) 25 tablet 5   No current facility-administered medications for this visit.    Allergies-reviewed and updated Allergies  Allergen Reactions   Sulfa Antibiotics Rash    rash    Social History   Social History Narrative   Married (brother patient here, wife patient elsewhere). 2 children and 2 grandkids 4 and 2 in 2022 (daughter  in [place] with 2 kids, son in [place]-working with [employer], sports).       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 Oropharynx normal Neck: no thyromegaly CV: RRR no murmurs rubs or gallops Lungs: CTAB no crackles, wheeze, rhonchi Abdomen: soft/nontender/nondistended/normal bowel sounds. No rebound or guarding.  Ext: no edema Skin: warm, dry Neuro: grossly normal, moves all extremities, PERRLA    Assessment and Plan  77 y.o. male presenting for annual physical.  Health Maintenance counseling: 1. Anticipatory guidance: Patient counseled regarding regular dental exams -q6 months, eye exams -yearly,  avoiding smoking and second hand smoke , limiting alcohol to 2 beverages per day - doesn't drink, no illicit drugs .   2. Risk factor reduction:  Advised patient of need for regular exercise and diet rich and fruits and vegetables to reduce risk of heart attack and stroke.  Exercise-  5 days a week 30 minutes on treadmill typically  but has been very active with church renovation last 3 months- tons of steps Diet/weight management-stable weight from last year- chicken and fish mainly for protein with alpha gal.  Wt Readings from Last 3 Encounters:  04/03/23 194 lb 12.8 oz (88.4 kg)  02/28/23 190 lb 12.8 oz (86.5 kg)  11/11/22 192 lb 6.4 oz (87.3 kg)  3. Immunizations/screenings/ancillary studies- flu shot today , COVID - holding off Immunization History  Administered Date(s) Administered   Fluad Quad(high Dose 65+) 04/04/2019, 04/15/2020, 04/22/2021   Influenza Split 05/30/2011, 05/15/2012   Influenza Whole 03/26/2008, 04/22/2010   Influenza, High Dose Seasonal PF 04/26/2013, 04/15/2014, 04/28/2016, 03/27/2018   Influenza, Quadrivalent, Recombinant, Inj, Pf 04/13/2017   Influenza,inj,Quad PF,6+ Mos 03/17/2015   Influenza-Unspecified 04/13/2017   Moderna Sars-Covid-2 Vaccination 07/31/2019, 08/28/2019, 06/26/2020   Pneumococcal Conjugate-13 04/15/2014   Pneumococcal  Polysaccharide-23 04/22/2010   Td 07/12/1995, 03/26/2008   Tdap 10/04/2016   Zoster Recombinant(Shingrix) 03/13/2021, 06/08/2021   Zoster, Live 03/26/2008  4. Prostate cancer screening-  low risk prior psa trend after history of radical prostatectomy with Dr. Laverle Patter- we monitor PSA for now  Lab Results  Component Value Date   PSA 0.00 (L) 04/04/2022   PSA 0.00 Repeated and verified X2. (L) 03/01/2021   PSA 0.015ng 11/21/2018   5. Colon cancer screening - last in 2019 and was released- no bright red blood per rectum or melena  6. Skin cancer screening- sees Dr. Mayford Knife for dermatology.  advised regular sunscreen use. Denies worrisome, changing, or new skin lesions.  7. Smoking associated screening (lung cancer screening, AAA screen 65-75, UA)- never smoker  8. STD screening - only active with wife   Status of chronic or acute concerns   #Alpha gal positive - prior diarrhea and RUQ pain for 10 months before discovery  -tested by gastroenterology in  [place]  -no respiratory issues   -stopped beef/pork/red meat/gelatin derivatives   -had been treated for H. Pylori previously - appointment October 2nd with Dr. Bess Harvest chiropractor in [place]-95% cure rate  -01/27/23 scan in under media from report  #CAD #Hyperlipidemia-LDL goal under 70 S: Medications: Atorvastatin 40 mg daily- Also compliant with Plavix-not on aspirin.  -has never had to use nitroglycerin but has on hand.  Follows with Dr. [name] -no chest pain or shortness of breath  Lab Results  Component Value Date   CHOL 148 04/04/2022   HDL 48.30 04/04/2022   LDLCALC 67 04/04/2022   LDLDIRECT 86.0 10/16/2015   TRIG 164.0 (H) 04/04/2022   CHOLHDL 3 04/04/2022   A/P: coronary artery disease asymptomatic - continue current medications Lipids- hopefully stable- update lipid panel today. Continue current meds for now   #GERD S: Medication: Protonix 40 mg daily- breakthrough on 20mg .  Have discussed Pepcid in the past but does not like twice daily dosing. A/P: stable- continue current medicines    #Insomnia-trazodone helpful -Patient also with some anxiety and was prescribed alprazolam by cardiology in the past- now just buspirone -both insomnia and anxiety doing well    # B12 deficiency S: Current treatment/medication (oral vs. IM): Oral over-the-counter Lab Results  Component Value Date   VITAMINB12 1,051 (H) 04/04/2022  A/P: hopefully stable- update b12 today. Continue current meds for now    #Vitamin D deficiency S: Medication: 1000 units daily Last vitamin D Lab Results  Component Value Date   VD25OH 43.81 04/04/2022  A/P: hopefully stable- update vitamin D today. Continue current meds for now     #Polymyalgia rheumatica history-  remained off prednisone and methotrexate for now 2 years-followed with Dr. Kathi Ludwig previously- available if needed- no recurrence    #penile tip irritation- OTC (available over the counter without a prescription) lotrimin - that  got better - no incontinence issues in the day, was having mild nighttime issues beofre and slight odor- high specific gravity likely dehydrated and could have been cause  Recommended follow up: Return in about 1 year (around 04/02/2024) for physical or sooner if needed.Schedule b4 you leave.  Lab/Order associations: will come back fasting   ICD-10-CM   1. Preventative health care  Z00.00     2. Vitamin B12 deficiency  E53.8     3. Hyperlipidemia, unspecified hyperlipidemia type  E78.5     4. Vitamin D deficiency  E55.9     5. Screening for prostate cancer  Z12.5  6. Need for immunization against influenza  Z23      No orders of the defined types were placed in this encounter.  Return precautions advised.  Tana Conch, MD

## 2023-04-03 NOTE — Patient Instructions (Addendum)
Schedule a lab visit at the check out desk within 2 weeks. Return for future fasting labs meaning nothing but water after midnight please. Ok to take your medications with water.   Recommended follow up: Return in about 1 year (around 04/02/2024) for physical or sooner if needed.Schedule b4 you leave.

## 2023-04-04 ENCOUNTER — Telehealth: Payer: Self-pay | Admitting: Cardiovascular Disease

## 2023-04-04 ENCOUNTER — Other Ambulatory Visit: Payer: Self-pay | Admitting: Cardiovascular Disease

## 2023-04-04 ENCOUNTER — Other Ambulatory Visit: Payer: Self-pay

## 2023-04-04 MED ORDER — CLOPIDOGREL BISULFATE 75 MG PO TABS: 75.0000 mg | ORAL_TABLET | Freq: Every day | ORAL | 1 refills | Status: DC

## 2023-04-04 NOTE — Telephone Encounter (Signed)
*  STAT* If patient is at the pharmacy, call can be transferred to refill team.   1. Which medications need to be refilled? (please list name of each medication and dose if known)   clopidogrel (PLAVIX) 75 MG tablet     4. Which pharmacy/location (including street and city if local pharmacy) is medication to be sent to? [facility] - [place], [place] - [address]     5. Do they need a 30 day or 90 day supply? 90

## 2023-04-07 ENCOUNTER — Encounter: Payer: Medicare Other | Admitting: Family Medicine

## 2023-04-07 ENCOUNTER — Other Ambulatory Visit (INDEPENDENT_AMBULATORY_CARE_PROVIDER_SITE_OTHER): Payer: Medicare Other

## 2023-04-07 DIAGNOSIS — E785 Hyperlipidemia, unspecified: Secondary | ICD-10-CM

## 2023-04-07 DIAGNOSIS — Z125 Encounter for screening for malignant neoplasm of prostate: Secondary | ICD-10-CM | POA: Diagnosis not present

## 2023-04-07 DIAGNOSIS — E559 Vitamin D deficiency, unspecified: Secondary | ICD-10-CM | POA: Diagnosis not present

## 2023-04-07 DIAGNOSIS — E538 Deficiency of other specified B group vitamins: Secondary | ICD-10-CM | POA: Diagnosis not present

## 2023-04-07 LAB — CBC WITH DIFFERENTIAL/PLATELET
Basophils Absolute: 0 10*3/uL (ref 0.0–0.1)
Basophils Relative: 0.8 % (ref 0.0–3.0)
Eosinophils Absolute: 0.2 10*3/uL (ref 0.0–0.7)
Eosinophils Relative: 5.9 % — ABNORMAL HIGH (ref 0.0–5.0)
HCT: 39.9 % (ref 39.0–52.0)
Hemoglobin: 13.4 g/dL (ref 13.0–17.0)
Lymphocytes Relative: 33.8 % (ref 12.0–46.0)
Lymphs Abs: 1.3 10*3/uL (ref 0.7–4.0)
MCHC: 33.6 g/dL (ref 30.0–36.0)
MCV: 91.1 fL (ref 78.0–100.0)
Monocytes Absolute: 0.3 10*3/uL (ref 0.1–1.0)
Monocytes Relative: 8.7 % (ref 3.0–12.0)
Neutro Abs: 2 10*3/uL (ref 1.4–7.7)
Neutrophils Relative %: 50.8 % (ref 43.0–77.0)
Platelets: 208 10*3/uL (ref 150.0–400.0)
RBC: 4.38 Mil/uL (ref 4.22–5.81)
RDW: 12.8 % (ref 11.5–15.5)
WBC: 3.9 10*3/uL — ABNORMAL LOW (ref 4.0–10.5)

## 2023-04-07 LAB — LIPID PANEL
Cholesterol: 132 mg/dL (ref 0–200)
HDL: 48.6 mg/dL (ref >39.00)
LDL Cholesterol: 56 mg/dL (ref 0–99)
NonHDL: 83.53
Total CHOL/HDL Ratio: 3
Triglycerides: 140 mg/dL (ref 0.0–149.0)
VLDL: 28 mg/dL (ref 0.0–40.0)

## 2023-04-07 LAB — COMPREHENSIVE METABOLIC PANEL
ALT: 15 U/L (ref 0–53)
AST: 15 U/L (ref 0–37)
Albumin: 4.2 g/dL (ref 3.5–5.2)
Alkaline Phosphatase: 70 U/L (ref 39–117)
BUN: 19 mg/dL (ref 6–23)
CO2: 30 meq/L (ref 19–32)
Calcium: 9 mg/dL (ref 8.4–10.5)
Chloride: 103 meq/L (ref 96–112)
Creatinine, Ser: 1.02 mg/dL (ref 0.40–1.50)
GFR: 69.86 mL/min (ref >60.00)
Glucose, Bld: 101 mg/dL — ABNORMAL HIGH (ref 70–99)
Potassium: 4.4 meq/L (ref 3.5–5.1)
Sodium: 140 meq/L (ref 135–145)
Total Bilirubin: 0.7 mg/dL (ref 0.2–1.2)
Total Protein: 6.6 g/dL (ref 6.0–8.3)

## 2023-04-07 LAB — VITAMIN B12: Vitamin B-12: 894 pg/mL (ref 211–911)

## 2023-04-07 LAB — PSA, MEDICARE: PSA: 0 ng/mL — ABNORMAL LOW (ref 0.10–4.00)

## 2023-04-07 LAB — VITAMIN D 25 HYDROXY (VIT D DEFICIENCY, FRACTURES): VITD: 39.83 ng/mL (ref 30.00–100.00)

## 2023-04-08 ENCOUNTER — Other Ambulatory Visit: Payer: Self-pay | Admitting: Family

## 2023-04-08 MED ORDER — CLOPIDOGREL BISULFATE 75 MG PO TABS: 75.0000 mg | ORAL_TABLET | Freq: Every day | ORAL | 1 refills | Status: DC

## 2023-05-29 ENCOUNTER — Telehealth: Payer: Self-pay | Admitting: Family Medicine

## 2023-05-29 NOTE — Telephone Encounter (Signed)
Patient states he keeps getting MyChart messages stating he is over due for Flu shot.  Patient states he had his Flu shot at his appointment with Dr. Durene Cal on 04/03/23

## 2023-05-29 NOTE — Telephone Encounter (Signed)
Flu shot updated. 

## 2023-06-20 ENCOUNTER — Ambulatory Visit (INDEPENDENT_AMBULATORY_CARE_PROVIDER_SITE_OTHER): Payer: Medicare Other

## 2023-06-20 VITALS — Wt 194.0 lb

## 2023-06-20 DIAGNOSIS — Z0000 Encounter for general adult medical examination without abnormal findings: Secondary | ICD-10-CM

## 2023-06-20 NOTE — Progress Notes (Signed)
Subjective:   Richard Mahoney is a 77 y.o. male who presents for Medicare Annual/Subsequent preventive examination.  Visit Complete: Virtual I connected with  Richard Mahoney on 06/20/23 by a audio enabled telemedicine application and verified that I am speaking with the correct person using two identifiers.  Patient Location: Home  Provider Location: Office/Clinic  I discussed the limitations of evaluation and management by telemedicine. The patient expressed understanding and agreed to proceed.  Vital Signs: Because this visit was a virtual/telehealth visit, some criteria may be missing or patient reported. Any vitals not documented were not able to be obtained and vitals that have been documented are patient reported.  Patient Medicare AWV questionnaire was completed by the patient on 06/19/23; I have confirmed that all information answered by patient is correct and no changes since this date.  Cardiac Risk Factors include: advanced age (>57men, >25 women);dyslipidemia;male gender     Objective:    Today's Vitals   06/20/23 1503  Weight: 194 lb (88 kg)   Body mass index is 26.31 kg/m.     06/20/2023    3:07 PM 03/17/2022   11:46 AM 02/26/2021   11:45 AM 11/04/2016    2:06 PM 08/18/2014    6:03 AM 08/13/2014   10:32 AM 06/01/2012   10:24 PM  Advanced Directives  Does Patient Have a Medical Advance Directive? Yes Yes Yes Yes Yes Yes Patient has advance directive, copy not in chart  Type of Advance Directive Healthcare Power of [facility];Living will Healthcare Power of [place];Living will Healthcare Power of [employer] of [place];Living will Healthcare Power of [place];Living will   Does patient want to make changes to medical advance directive? No - Patient declined No - Patient declined   No - Patient declined No - Patient declined   Copy of Healthcare Power of Attorney in Chart? Yes - validated most recent copy scanned in chart (See row information) Yes -  validated most recent copy scanned in chart (See row information) Yes - validated most recent copy scanned in chart (See row information)  -- Yes Copy requested from other (Comment)    Current Medications (verified) Outpatient Encounter Medications as of 06/20/2023  Medication Sig   atorvastatin (LIPITOR) 40 MG tablet TAKE 1 TABLET BY MOUTH DAILY   busPIRone (BUSPAR) 5 MG tablet TAKE 1 TABLET BY MOUTH TWICE  DAILY AS NEEDED   chlorpheniramine (RA CHLORPHENIRAMINE MALEATE) 4 MG tablet    cholecalciferol (VITAMIN D3) 25 MCG (1000 UNIT) tablet Take 4,000 Units by mouth daily.   clopidogrel (PLAVIX) 75 MG tablet Take 1 tablet (75 mg total) by mouth daily.   cyanocobalamin 1000 MCG tablet Take 1,000 mcg by mouth daily.   pantoprazole (PROTONIX) 40 MG tablet TAKE 1 TABLET BY MOUTH DAILY   polyvinyl alcohol (LIQUIFILM TEARS) 1.4 % ophthalmic solution Place 1 drop into both eyes daily as needed for dry eyes.   sodium chloride (OCEAN) 0.65 % SOLN nasal spray Place 1 spray into both nostrils daily as needed for congestion.   traZODone (DESYREL) 50 MG tablet TAKE 1/2 TO 1 TABLET BY MOUTH AT BEDTIME AS NEEDED FOR SLEEP   EPINEPHrine 0.3 mg/0.3 mL IJ SOAJ injection Inject 0.3 mg into the muscle as needed for anaphylaxis. (Patient not taking: Reported on 04/03/2023)   nitroGLYCERIN (NITROSTAT) 0.4 MG SL tablet PLACE 1 TABLET UNDER THE TONGUE EVERY 5 MINUTES AS NEEDED FOR CHEST PAIN. (Patient not taking: Reported on 04/03/2023)   No facility-administered encounter medications on  file as of 06/20/2023.    Allergies (verified) Sulfa antibiotics   History: Past Medical History:  Diagnosis Date   Coronary artery disease    a. ETT 9/13: + ischemic ECG changes;  b. ETT-MV 9/13: EF 62%;  + ischemic EKG changes and + inf ischemia;  c. LHC 9/13: pLAD 30%, mLAD 70-80%, oDx 40-50% ==> FFR hemodynamically significant stenosis in mLAD ==> PCI: Xience Xpedition DES to mLAD; Cath 06/01/12   single vessel CAD w/ patent  mid LAD stent and normal LV systolic function   Fibromyalgia    muscle aches in past in both forearms, before statin even   GERD (gastroesophageal reflux disease)    Hyperlipidemia LDL goal < 70    Nephrolithiasis    Testosterone deficiency 04/28/2008   History treatment around 2009. No current treatment.      Past Surgical History:  Procedure Laterality Date   CARDIAC CATHETERIZATION  04/06/2012; 06/01/2012   CATARACT EXTRACTION W/ INTRAOCULAR LENS IMPLANT  08/2011   right .Left 12/2014.    CORONARY ANGIOPLASTY WITH STENT PLACEMENT  04/11/2012   DES LAD   CYSTOSCOPY W/ STONE MANIPULATION  2009   INGUINAL HERNIA REPAIR  1990's   right   KNEE ARTHROSCOPY  1990's   right   LEFT HEART CATHETERIZATION WITH CORONARY ANGIOGRAM N/A 06/01/2012   Procedure: LEFT HEART CATHETERIZATION WITH CORONARY ANGIOGRAM;  Surgeon: Kathleene Hazel, MD;  Location: [facility] CATH LAB;  Service: Cardiovascular;  Laterality: N/A;   LYMPHADENECTOMY Bilateral 08/18/2014   Procedure: LYMPHADENECTOMY;  Surgeon: Heloise Purpura, MD;  Location: [facility] ORS;  Service: Urology;  Laterality: Bilateral;   PERCUTANEOUS CORONARY STENT INTERVENTION (PCI-S) N/A 04/11/2012   Procedure: PERCUTANEOUS CORONARY STENT INTERVENTION (PCI-S);  Surgeon: Kathleene Hazel, MD;  Location: [facility] CATH LAB;  Service: Cardiovascular;  Laterality: N/A;   ROBOT ASSISTED LAPAROSCOPIC RADICAL PROSTATECTOMY N/A 08/18/2014   Procedure: ROBOTIC ASSISTED LAPAROSCOPIC RADICAL PROSTATECTOMY LEVEL 2;  Surgeon: Heloise Purpura, MD;  Location: [facility] ORS;  Service: Urology;  Laterality: N/A;   Family History  Problem Relation Age of Onset   Diabetes Mother    Heart attack Mother 9   Diabetes Father    Heart attack Father 83   Diabetes Sister    Arthritis Brother    Social History   Socioeconomic History   Marital status: Married    Spouse name: Not on file   Number of children: 2   Years of education: Not on file   Highest education level: Master's degree  (e.g., MA, MS, MEng, MEd, MSW, MBA)  Occupational History   Occupation: Retired  Tobacco Use   Smoking status: Never   Smokeless tobacco: Never  Substance and Sexual Activity   Alcohol use: No   Drug use: No   Sexual activity: Not Currently  Other Topics Concern   Not on file  Social History Narrative   Married (brother patient here, wife patient elsewhere). 2 children and 2 grandkids 4 and 2 in 2022 (daughter  in [place] with 2 kids, son in [place]-working with [employer]- Administrator, sports).       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)   -wife retiring 2022      Hobbies: golf, work in yard, walking with wife   Social Determinants of Health   Financial Resource Strain: Low Risk  (06/19/2023)   Overall Financial Resource Strain (CARDIA)    Difficulty of Paying Living Expenses: Not hard at all  Food Insecurity: No Food  Insecurity (06/19/2023)   Hunger Vital Sign    Worried About Running Out of Food in the Last Year: Never true    Ran Out of Food in the Last Year: Never true  Transportation Needs: No Transportation Needs (06/19/2023)   PRAPARE - Administrator, Civil Service (Medical): No    Lack of Transportation (Non-Medical): No  Physical Activity: Sufficiently Active (06/19/2023)   Exercise Vital Sign    Days of Exercise per Week: 5 days    Minutes of Exercise per Session: 40 min  Stress: No Stress Concern Present (06/19/2023)   [employer] of Occupational Health - Occupational Stress Questionnaire    Feeling of Stress : Only a little  Social Connections: Socially Integrated (06/19/2023)   Social Connection and Isolation Panel [NHANES]    Frequency of Communication with Friends and Family: More than three times a week    Frequency of Social Gatherings with Friends and Family: Twice a week    Attends Religious Services: More than 4 times per year    Active Member of [employer] or Organizations: Yes    Attends  Banker Meetings: 1 to 4 times per year    Marital Status: Married    Tobacco Counseling Counseling given: Not Answered   Clinical Intake:  Pre-visit preparation completed: Yes  Pain : No/denies pain     BMI - recorded: 26.31 Nutritional Status: BMI 25 -29 Overweight Nutritional Risks: None Diabetes: No  How often do you need to have someone help you when you read instructions, pamphlets, or other written materials from your doctor or pharmacy?: 1 - Never  Interpreter Needed?: No  Information entered by :: Lanier Ensign, LPN   Activities of Daily Living    06/19/2023    5:46 PM  In your present state of health, do you have any difficulty performing the following activities:  Hearing? 0  Vision? 0  Difficulty concentrating or making decisions? 0  Walking or climbing stairs? 0  Dressing or bathing? 0  Doing errands, shopping? 0  Preparing Food and eating ? N  Using the Toilet? N  In the past six months, have you accidently leaked urine? N  Do you have problems with loss of bowel control? N  Managing your Medications? N  Managing your Finances? N  Housekeeping or managing your Housekeeping? N    Patient Care Team: Shelva Majestic, MD as PCP - General (Family Medicine) Kathleene Hazel, MD as PCP - Cardiology (Cardiology) Heloise Purpura, MD as Consulting Physician (Urology) Rossie Muskrat, MD as Consulting Physician (Rheumatology)  Indicate any recent Medical Services you may have received from other than [facility] providers in the past year (date may be approximate).     Assessment:   This is a routine wellness examination for Richard Mahoney.  Hearing/Vision screen Hearing Screening - Comments:: Pt denies any hearing issues  Vision Screening - Comments:: Pt follows up with Dr Caroll Rancher for annual eye exams    Goals Addressed             This Visit's Progress    Patient Stated       Maintain health and activity        Depression  Screen    06/20/2023    3:08 PM 04/03/2023    4:33 PM 07/15/2022    9:20 AM 03/17/2022   11:46 AM 02/26/2021   11:43 AM 02/25/2020    9:08 AM 01/17/2019   11:22 AM  PHQ 2/9 Scores  PHQ - 2 Score 0 0 0 0 0 0 0  PHQ- 9 Score  0    0     Fall Risk    06/19/2023    5:46 PM 04/03/2023    4:33 PM 07/15/2022    9:20 AM 03/17/2022   11:46 AM 02/26/2021   11:45 AM  Fall Risk   Falls in the past year? 0 0 0 0 0  Number falls in past yr: 0 1 0 0 0  Injury with Fall? 0 0 0 0 0  Risk for fall due to : No Fall Risks No Fall Risks  No Fall Risks Impaired vision  Follow up Falls prevention discussed Falls evaluation completed  Falls evaluation completed Falls prevention discussed    MEDICARE RISK AT HOME: Medicare Risk at Home Any stairs in or around the home?: Yes If so, are there any without handrails?: No Home free of loose throw rugs in walkways, pet beds, electrical cords, etc?: Yes Adequate lighting in your home to reduce risk of falls?: Yes Life alert?: No Use of a cane, walker or w/c?: No Grab bars in the bathroom?: Yes Shower chair or bench in shower?: Yes Elevated toilet seat or a handicapped toilet?: No  TIMED UP AND GO:  Was the test performed?  No    Cognitive Function:    11/04/2016    2:11 PM  MMSE - Mini Mental State Exam  Not completed: --        06/20/2023    3:09 PM 03/17/2022   11:41 AM 02/26/2021   11:46 AM  6CIT Screen  What Year? 0 points 0 points 0 points  What month? 0 points 0 points 0 points  What time? 0 points 0 points 0 points  Count back from 20 0 points  0 points  Months in reverse 0 points  0 points  Repeat phrase 0 points  0 points  Total Score 0 points  0 points    Immunizations Immunization History  Administered Date(s) Administered   Fluad Quad(high Dose 65+) 04/04/2019, 04/15/2020, 04/22/2021   Influenza Split 05/30/2011, 05/15/2012   Influenza Whole 03/26/2008, 04/22/2010   Influenza, High Dose Seasonal PF 04/26/2013, 04/15/2014,  04/28/2016, 03/27/2018, 04/03/2023   Influenza, Quadrivalent, Recombinant, Inj, Pf 04/13/2017   Influenza,inj,Quad PF,6+ Mos 03/17/2015   Influenza-Unspecified 04/13/2017   Moderna Sars-Covid-2 Vaccination 07/31/2019, 08/28/2019, 06/26/2020   Pneumococcal Conjugate-13 04/15/2014   Pneumococcal Polysaccharide-23 04/22/2010   Td 07/12/1995, 03/26/2008   Tdap 10/04/2016   Zoster Recombinant(Shingrix) 03/13/2021, 06/08/2021   Zoster, Live 03/26/2008, 03/13/2021    TDAP status: Up to date  Flu Vaccine status: Up to date  Pneumococcal vaccine status: Up to date  Covid-19 vaccine status: Information provided on how to obtain vaccines.   Qualifies for Shingles Vaccine? Yes   Zostavax completed Yes   Shingrix Completed?: Yes  Screening Tests Health Maintenance  Topic Date Due   Hepatitis C Screening  04/12/2060 (Originally 09/01/1963)   Medicare Annual Wellness (AWV)  06/19/2024   DTaP/Tdap/Td (4 - Td or Tdap) 10/05/2026   Pneumonia Vaccine 72+ Years old  Completed   INFLUENZA VACCINE  Completed   COVID-19 Vaccine  Completed   Zoster Vaccines- Shingrix  Completed   HPV VACCINES  Aged Out   Colonoscopy  Discontinued    Health Maintenance  There are no preventive care reminders to display for this patient.   Colorectal cancer screening: No longer required.   Additional Screening:  Hepatitis C Screening: does qualify  Vision  Screening: Recommended annual ophthalmology exams for early detection of glaucoma and other disorders of the eye. Is the patient up to date with their annual eye exam?  Yes  Who is the provider or what is the name of the office in which the patient attends annual eye exams? Dr Caroll Rancher If pt is not established with a provider, would they like to be referred to a provider to establish care? No .   Dental Screening: Recommended annual dental exams for proper oral hygiene   Community Resource Referral / Chronic Care Management: CRR required this visit?   No   CCM required this visit?  No     Plan:     I have personally reviewed and noted the following in the patient's chart:   Medical and social history Use of alcohol, tobacco or illicit drugs  Current medications and supplements including opioid prescriptions. Patient is not currently taking opioid prescriptions. Functional ability and status Nutritional status Physical activity Advanced directives List of other physicians Hospitalizations, surgeries, and ER visits in previous 12 months Vitals Screenings to include cognitive, depression, and falls Referrals and appointments  In addition, I have reviewed and discussed with patient certain preventive protocols, quality metrics, and best practice recommendations. A written personalized care plan for preventive services as well as general preventive health recommendations were provided to patient.     Marzella Schlein, LPN   [id number]   After Visit Summary: (MyChart) Due to this being a telephonic visit, the after visit summary with patients personalized plan was offered to patient via MyChart   Nurse Notes: none

## 2023-06-20 NOTE — Patient Instructions (Signed)
Mr. Packett , Thank you for taking time to come for your Medicare Wellness Visit. I appreciate your ongoing commitment to your health goals. Please review the following plan we discussed and let me know if I can assist you in the future.   Referrals/Orders/Follow-Ups/Clinician Recommendations: maintain health and activity   This is a list of the screening recommended for you and due dates:  Health Maintenance  Topic Date Due   Medicare Annual Wellness Visit  03/18/2023   Hepatitis C Screening  04/12/2060*   DTaP/Tdap/Td vaccine (4 - Td or Tdap) 10/05/2026   Pneumonia Vaccine  Completed   Flu Shot  Completed   COVID-19 Vaccine  Completed   Zoster (Shingles) Vaccine  Completed   HPV Vaccine  Aged Out   Colon Cancer Screening  Discontinued  *Topic was postponed. The date shown is not the original due date.    Advanced directives: (In Chart) A copy of your advanced directives are scanned into your chart should your provider ever need it.  Next Medicare Annual Wellness Visit scheduled for next year: Yes

## 2023-07-24 ENCOUNTER — Encounter: Payer: Medicare Other | Admitting: Family Medicine

## 2023-08-08 ENCOUNTER — Ambulatory Visit (INDEPENDENT_AMBULATORY_CARE_PROVIDER_SITE_OTHER): Payer: Medicare Other

## 2023-08-08 ENCOUNTER — Encounter: Payer: Self-pay | Admitting: Family Medicine

## 2023-08-08 ENCOUNTER — Ambulatory Visit (INDEPENDENT_AMBULATORY_CARE_PROVIDER_SITE_OTHER): Payer: Medicare Other | Admitting: Physician Assistant

## 2023-08-08 VITALS — BP 130/80 | HR 92 | Temp 98.1°F | Ht 72.0 in | Wt 195.4 lb

## 2023-08-08 DIAGNOSIS — R509 Fever, unspecified: Secondary | ICD-10-CM

## 2023-08-08 DIAGNOSIS — R918 Other nonspecific abnormal finding of lung field: Secondary | ICD-10-CM | POA: Diagnosis not present

## 2023-08-08 DIAGNOSIS — R051 Acute cough: Secondary | ICD-10-CM

## 2023-08-08 DIAGNOSIS — R059 Cough, unspecified: Secondary | ICD-10-CM | POA: Diagnosis not present

## 2023-08-08 LAB — POC INFLUENZA A&B (BINAX/QUICKVUE)
Influenza A, POC: POSITIVE — AB
Influenza B, POC: NEGATIVE

## 2023-08-08 MED ORDER — PREDNISONE 20 MG PO TABS: 20.0000 mg | ORAL_TABLET | Freq: Two times a day (BID) | ORAL | 0 refills | Status: DC

## 2023-08-08 NOTE — Progress Notes (Signed)
Richard Mahoney is a 78 y.o. male here for a new problem.  History of Present Illness:   Chief Complaint  Patient presents with   Cough    Pt was exposed to Flu from grand kids last week, He started coughing and expectorating brown sputum, on Friday, fever. He had a Z-pak started it on Friday and Mucinex. He is feeling a little better today, but concerned about deep cough and chest congestion.   Cough  He complains of a cough that has persisted since this past Friday.  He states that he was exposed to the Flu from infected grandchildren. Associated symptoms include sputum production that is brown in color along with chest congestion. He also reports experiencing chills, fevers, headaches, and body aches. He does report taking a z-pak, last dose was earlier today. He was also taking Mucinex DM once in the morning and once before bed.  Although he is doing better, he reports experiencing chest and abdominal pressure this morning.  Denies any history of asthma or COPD. Covid test preformed yesterday at home was negative.    Past Medical History:  Diagnosis Date   Coronary artery disease    a. ETT 9/13: + ischemic ECG changes;  b. ETT-MV 9/13: EF 62%;  + ischemic EKG changes and + inf ischemia;  c. LHC 9/13: pLAD 30%, mLAD 70-80%, oDx 40-50% ==> FFR hemodynamically significant stenosis in mLAD ==> PCI: Xience Xpedition DES to mLAD; Cath 06/01/12   single vessel CAD w/ patent mid LAD stent and normal LV systolic function   Fibromyalgia    muscle aches in past in both forearms, before statin even   GERD (gastroesophageal reflux disease)    Hyperlipidemia LDL goal < 70    Nephrolithiasis    Testosterone deficiency 04/28/2008   History treatment around 2009. No current treatment.        Social History   Tobacco Use   Smoking status: Never   Smokeless tobacco: Never  Substance Use Topics   Alcohol use: No   Drug use: No    Past Surgical History:  Procedure Laterality Date    CARDIAC CATHETERIZATION  04/06/2012; 06/01/2012   CATARACT EXTRACTION W/ INTRAOCULAR LENS IMPLANT  08/2011   right .Left 12/2014.    CORONARY ANGIOPLASTY WITH STENT PLACEMENT  04/11/2012   DES LAD   CYSTOSCOPY W/ STONE MANIPULATION  2009   INGUINAL HERNIA REPAIR  1990's   right   KNEE ARTHROSCOPY  1990's   right   LEFT HEART CATHETERIZATION WITH CORONARY ANGIOGRAM N/A 06/01/2012   Procedure: LEFT HEART CATHETERIZATION WITH CORONARY ANGIOGRAM;  Surgeon: Kathleene Hazel, MD;  Location: [facility] CATH LAB;  Service: Cardiovascular;  Laterality: N/A;   LYMPHADENECTOMY Bilateral 08/18/2014   Procedure: LYMPHADENECTOMY;  Surgeon: Heloise Purpura, MD;  Location: [facility] ORS;  Service: Urology;  Laterality: Bilateral;   PERCUTANEOUS CORONARY STENT INTERVENTION (PCI-S) N/A 04/11/2012   Procedure: PERCUTANEOUS CORONARY STENT INTERVENTION (PCI-S);  Surgeon: Kathleene Hazel, MD;  Location: [facility] CATH LAB;  Service: Cardiovascular;  Laterality: N/A;   ROBOT ASSISTED LAPAROSCOPIC RADICAL PROSTATECTOMY N/A 08/18/2014   Procedure: ROBOTIC ASSISTED LAPAROSCOPIC RADICAL PROSTATECTOMY LEVEL 2;  Surgeon: Heloise Purpura, MD;  Location: [facility] ORS;  Service: Urology;  Laterality: N/A;    Family History  Problem Relation Age of Onset   Diabetes Mother    Heart attack Mother 75   Diabetes Father    Heart attack Father 37   Diabetes Sister    Arthritis Brother  Allergies  Allergen Reactions   Sulfa Antibiotics Rash    rash    Current Medications:   Current Outpatient Medications:    atorvastatin (LIPITOR) 40 MG tablet, TAKE 1 TABLET BY MOUTH DAILY, Disp: 100 tablet, Rfl: 2   azithromycin (ZITHROMAX) 250 MG tablet, Take 250 mg by mouth daily., Disp: , Rfl:    busPIRone (BUSPAR) 5 MG tablet, TAKE 1 TABLET BY MOUTH TWICE  DAILY AS NEEDED, Disp: 200 tablet, Rfl: 0   chlorpheniramine (RA CHLORPHENIRAMINE MALEATE) 4 MG tablet, , Disp: , Rfl:    cholecalciferol (VITAMIN D3) 25 MCG (1000 UNIT) tablet, Take 4,000 Units  by mouth daily., Disp: , Rfl:    clopidogrel (PLAVIX) 75 MG tablet, Take 1 tablet (75 mg total) by mouth daily., Disp: 90 tablet, Rfl: 1   cyanocobalamin 1000 MCG tablet, Take 1,000 mcg by mouth daily., Disp: , Rfl:    EPINEPHrine 0.3 mg/0.3 mL IJ SOAJ injection, Inject 0.3 mg into the muscle as needed for anaphylaxis., Disp: 2 each, Rfl: 1   nitroGLYCERIN (NITROSTAT) 0.4 MG SL tablet, PLACE 1 TABLET UNDER THE TONGUE EVERY 5 MINUTES AS NEEDED FOR CHEST PAIN., Disp: 25 tablet, Rfl: 5   pantoprazole (PROTONIX) 40 MG tablet, TAKE 1 TABLET BY MOUTH DAILY, Disp: 100 tablet, Rfl: 2   polyvinyl alcohol (LIQUIFILM TEARS) 1.4 % ophthalmic solution, Place 1 drop into both eyes daily as needed for dry eyes., Disp: , Rfl:    predniSONE (DELTASONE) 20 MG tablet, Take 1 tablet (20 mg total) by mouth 2 (two) times daily with a meal., Disp: 10 tablet, Rfl: 0   sodium chloride (OCEAN) 0.65 % SOLN nasal spray, Place 1 spray into both nostrils daily as needed for congestion., Disp: , Rfl:    traZODone (DESYREL) 50 MG tablet, TAKE 1/2 TO 1 TABLET BY MOUTH AT BEDTIME AS NEEDED FOR SLEEP, Disp: 90 tablet, Rfl: 3   Review of Systems:   Review of Systems  Constitutional:  Positive for chills and fever.  HENT:  Positive for congestion.   Respiratory:  Positive for cough and sputum production.   Musculoskeletal:  Positive for joint pain.  Neurological:  Positive for headaches.    Vitals:   Vitals:   08/08/23 1305  BP: 130/80  Pulse: 92  Temp: 98.1 F (36.7 C)  TempSrc: Temporal  SpO2: 94%  Weight: 195 lb 6.1 oz (88.6 kg)  Height: 6' (1.829 m)     Body mass index is 26.5 kg/m.  Physical Exam:   Physical Exam Vitals and nursing note reviewed.  Constitutional:      General: He is not in acute distress.    Appearance: He is well-developed. He is not ill-appearing or toxic-appearing.  HENT:     Head: Normocephalic and atraumatic.     Right Ear: Tympanic membrane, ear canal and external ear normal.  Tympanic membrane is not erythematous, retracted or bulging.     Left Ear: Tympanic membrane, ear canal and external ear normal. Tympanic membrane is not erythematous, retracted or bulging.     Nose: Nose normal.     Right Sinus: No maxillary sinus tenderness or frontal sinus tenderness.     Left Sinus: No maxillary sinus tenderness or frontal sinus tenderness.     Mouth/Throat:     Pharynx: Uvula midline. No posterior oropharyngeal erythema.  Eyes:     General: Lids are normal.     Conjunctiva/sclera: Conjunctivae normal.  Neck:     Trachea: Trachea normal.  Cardiovascular:  Rate and Rhythm: Normal rate and regular rhythm.     Heart sounds: Normal heart sounds, S1 normal and S2 normal.  Pulmonary:     Effort: Pulmonary effort is normal.     Breath sounds: Examination of the right-upper field reveals rhonchi. Examination of the right-middle field reveals rhonchi. Examination of the right-lower field reveals rhonchi. Decreased breath sounds and rhonchi present. No wheezing or rales.  Lymphadenopathy:     Cervical: No cervical adenopathy.  Skin:    General: Skin is warm and dry.  Neurological:     Mental Status: He is alert.  Psychiatric:        Speech: Speech normal.        Behavior: Behavior normal. Behavior is cooperative.    Results for orders placed or performed in visit on 08/08/23  POC Influenza A&B(BINAX/QUICKVUE)  Result Value Ref Range   Influenza A, POC Positive (A) Negative   Influenza B, POC Negative Negative     Assessment and Plan:   Fever, unspecified fever cause; Acute cough No red flags on exam.   Will initiate prednisone for acute cough per orders.  Will also rule out pneumonia with chest xray given concerning lung exam, consider adding on Augmentin if presence of pneumonia  Discussed taking medications as prescribed.  Reviewed return precautions including new or worsening fever, SOB, new or worsening cough or other concerns.  Push fluids and rest.  I  recommend that patient follow-up if symptoms worsen or persist despite treatment x 7-10 days, sooner if needed.  Jarold Motto, PA-C  I,Safa M Kadhim,acting as a scribe for Jarold Motto, PA.,have documented all relevant documentation on the behalf of Jarold Motto, PA,as directed by  Jarold Motto, PA while in the presence of Jarold Motto, [place].   I, Jarold Motto, [place], have reviewed all documentation for this visit. The documentation on 08/08/23 for the exam, diagnosis, procedures, and orders are all accurate and complete.

## 2023-08-08 NOTE — Patient Instructions (Addendum)
It was great to see you!  Start prednisone Chest xray today I will reach out with results  PUSH FLUIDS -- urine should be clear  Let me know if any concerns or lack of improvement  Take care,  Jarold Motto PA-C

## 2023-08-09 ENCOUNTER — Telehealth: Payer: Self-pay | Admitting: *Deleted

## 2023-08-09 NOTE — Telephone Encounter (Signed)
Copied from CRM [id number]. Topic: Clinical - Lab/Test Results >> Aug 08, 2023  4:20 PM Fredrica W wrote: Reason for CRM: Patient was receiving Mychart messages but not able to access MyChart to check messges. Patient is waiting to hear back from provider on chest xray for pneumonia and would like to be called with results once finalized and reviewed instead of posting a message on Mychart since he can't access MyChart at this time. Thank You.

## 2023-08-10 ENCOUNTER — Encounter: Payer: Self-pay | Admitting: Family Medicine

## 2023-08-10 ENCOUNTER — Other Ambulatory Visit: Payer: Self-pay | Admitting: Physician Assistant

## 2023-08-10 MED ORDER — AMOXICILLIN-POT CLAVULANATE 875-125 MG PO TABS: 1.0000 | ORAL_TABLET | Freq: Two times a day (BID) | ORAL | 0 refills | Status: DC

## 2023-08-10 NOTE — Telephone Encounter (Signed)
Pt saw Sam on 1/28 and chest xray was ordered by her.

## 2023-08-14 ENCOUNTER — Ambulatory Visit (INDEPENDENT_AMBULATORY_CARE_PROVIDER_SITE_OTHER): Payer: Medicare Other | Admitting: Family Medicine

## 2023-08-14 ENCOUNTER — Encounter: Payer: Self-pay | Admitting: Family Medicine

## 2023-08-14 VITALS — BP 134/84 | HR 77 | Temp 97.4°F | Ht 72.0 in | Wt 193.2 lb

## 2023-08-14 DIAGNOSIS — E663 Overweight: Secondary | ICD-10-CM

## 2023-08-14 DIAGNOSIS — I2510 Atherosclerotic heart disease of native coronary artery without angina pectoris: Secondary | ICD-10-CM | POA: Diagnosis not present

## 2023-08-14 DIAGNOSIS — Z6826 Body mass index (BMI) 26.0-26.9, adult: Secondary | ICD-10-CM | POA: Diagnosis not present

## 2023-08-14 DIAGNOSIS — E785 Hyperlipidemia, unspecified: Secondary | ICD-10-CM | POA: Diagnosis not present

## 2023-08-14 MED ORDER — OZEMPIC (0.25 OR 0.5 MG/DOSE) 2 MG/3ML ~~LOC~~ SOPN: PEN_INJECTOR | SUBCUTANEOUS | 1 refills | Status: DC

## 2023-08-14 NOTE — Progress Notes (Signed)
Phone [phone] In person visit   Subjective:   Richard Mahoney is a 78 y.o. year old very pleasant male patient who presents for/with See problem oriented charting Chief Complaint  Patient presents with   weight issues    Pt states he is having issues losing weight.   Past Medical History-  Patient Active Problem List   Diagnosis Date Noted   History of prostate cancer 08/18/2014    Priority: High   Coronary atherosclerosis of native coronary artery 04/12/2012    Priority: High   GERD (gastroesophageal reflux disease) 03/17/2015    Priority: Medium    Hyperlipidemia 04/25/2012    Priority: Medium    Insomnia 04/22/2010    Priority: Low   NEPHROLITHIASIS, HX OF 03/26/2008    Priority: Low    Medications- reviewed and updated Current Outpatient Medications  Medication Sig Dispense Refill   amoxicillin-clavulanate (AUGMENTIN) 875-125 MG tablet Take 1 tablet by mouth 2 (two) times daily. 14 tablet 0   atorvastatin (LIPITOR) 40 MG tablet TAKE 1 TABLET BY MOUTH DAILY 100 tablet 2   busPIRone (BUSPAR) 5 MG tablet TAKE 1 TABLET BY MOUTH TWICE  DAILY AS NEEDED 200 tablet 0   chlorpheniramine (RA CHLORPHENIRAMINE MALEATE) 4 MG tablet      cholecalciferol (VITAMIN D3) 25 MCG (1000 UNIT) tablet Take 4,000 Units by mouth daily.     clopidogrel (PLAVIX) 75 MG tablet Take 1 tablet (75 mg total) by mouth daily. 90 tablet 1   cyanocobalamin 1000 MCG tablet Take 1,000 mcg by mouth daily.     EPINEPHrine 0.3 mg/0.3 mL IJ SOAJ injection Inject 0.3 mg into the muscle as needed for anaphylaxis. 2 each 1   nitroGLYCERIN (NITROSTAT) 0.4 MG SL tablet PLACE 1 TABLET UNDER THE TONGUE EVERY 5 MINUTES AS NEEDED FOR CHEST PAIN. 25 tablet 5   pantoprazole (PROTONIX) 40 MG tablet TAKE 1 TABLET BY MOUTH DAILY 100 tablet 2   polyvinyl alcohol (LIQUIFILM TEARS) 1.4 % ophthalmic solution Place 1 drop into both eyes daily as needed for dry eyes.     predniSONE (DELTASONE) 20 MG tablet Take 1 tablet (20  mg total) by mouth 2 (two) times daily with a meal. 10 tablet 0   Semaglutide,0.25 or 0.5MG /DOS, (OZEMPIC, 0.25 OR 0.5 MG/DOSE,) 2 MG/3ML SOPN Inject 0.25 mg into the skin once a week for 28 days, THEN 0.5 mg once a week for 28 days. 6 mL 1   sodium chloride (OCEAN) 0.65 % SOLN nasal spray Place 1 spray into both nostrils daily as needed for congestion.     traZODone (DESYREL) 50 MG tablet TAKE 1/2 TO 1 TABLET BY MOUTH AT BEDTIME AS NEEDED FOR SLEEP 90 tablet 3   No current facility-administered medications for this visit.     Objective:  BP 134/84 Comment: home bps usually high 120s/70s  Pulse 77   Temp (!) 97.4 F (36.3 C)   Ht 6' (1.829 m)   Wt 193 lb 3.2 oz (87.6 kg)   SpO2 96%   BMI 26.20 kg/m  Gen: NAD, resting comfortably/well-appearing CV: RRR no murmurs rubs or gallops Lungs: CTAB no crackles, wheeze, rhonchi other than slight wheeze at right lung base-recovering from flu Ext: no edema Skin: warm, dry     Assessment and Plan   #Overweight   S: Patient has wanted to get to a weight under 190-we have discussed this goal together in the past.  From my chart notes "I do work on this, absolutely no soft  drinks, try to eat right, not a lot of sweets, breads, walk the treadmill 5 days a week, and constantly turning away from foods I want to eat. I do have the will power to do that. I still find my weight teeters in 195 range. My Chart weights for me range from 198 on 7/20 to 195.4 this past week 1/25. I would love to lose 20+ pounds to be in 175 range where I feel I would feel better mentally & physically. With my past efforts, I can't do it without some help. How do you feel about Ozempic for me" -sometimes intermittent fasts at home- coffee in morning and waits until evening to eat -mom and dad had diabetes and so did sister Wt Readings from Last 3 Encounters:  08/14/23 193 lb 3.2 oz (87.6 kg)  08/08/23 195 lb 6.1 oz (88.6 kg)  06/20/23 194 lb (88 kg)  A/P: Stable but mild  poor control of overweight status we had a good discussion today about lifestyle changes including adding calorie counting while making sure to get adequate protein and weight training versus trial of GLP-1 such as Ozempic for potential cardiovascular benefit.  He would like to trial Ozempic if covered by insurance we are submitting that today.  We discussed benefits as well as risks of medication like Ozempic going through up-to-date side effect profile together -Discussed possible self-pay through [employer] [facility] for Zepbound but he would strongly prefer pens so cost for Ozempic pens likely closer to thousand dollars if has to do self-pay -His goal is to get weight closer to 175  #CAD #Hyperlipidemia-LDL goal under 70 S: Medications: Atorvastatin 40 mg daily- Also compliant with Plavix-not on aspirin.  -has never had to use nitroglycerin.  Follows with Dr. [name] -no chest pain or shortness of breath  Lab Results  Component Value Date   CHOL 132 04/07/2023   HDL 48.60 04/07/2023   LDLCALC 56 04/07/2023   LDLDIRECT 86.0 10/16/2015   TRIG 140.0 04/07/2023   CHOLHDL 3 04/07/2023   A/P: CAD asymptomatic-continue current medication Lipids at goal with LDL under 70-continue current medication plus working on weight loss may help further  Recommended follow up: Return for next already scheduled visit or sooner if needed.  Also see after visit summary Future Appointments  Date Time Provider Department Center  08/29/2023  3:40 PM Kathleene Hazel, MD [facility]  06/24/2024  9:00 AM Shelva Majestic, MD [facility]  07/01/2024  2:20 PM [facility] ANNUAL WELLNESS VISIT 1 [facility]    Lab/Order associations:   ICD-10-CM   1. Atherosclerosis of native coronary artery of native heart without angina pectoris  I25.10 Semaglutide,0.25 or 0.5MG /DOS, (OZEMPIC, 0.25 OR 0.5 MG/DOSE,) 2 MG/3ML SOPN    2. Overweight  E66.3     3. BMI 26.0-26.9,adult  Z68.26     4. Hyperlipidemia,  unspecified hyperlipidemia type  E78.5       Meds ordered this encounter  Medications   Semaglutide,0.25 or 0.5MG /DOS, (OZEMPIC, 0.25 OR 0.5 MG/DOSE,) 2 MG/3ML SOPN    Sig: Inject 0.25 mg into the skin once a week for 28 days, THEN 0.5 mg once a week for 28 days.    Dispense:  6 mL    Refill:  1    Submitting under CAD indication- patient aware will likely need prior authorize first- please send to [facility]    Return precautions advised.  Tana Conch, MD

## 2023-08-14 NOTE — Patient Instructions (Addendum)
We are going to trial Ozempic through prior authorization for CAD indication- if not covered we discussed possibly hiring trainer and private pay nutrition to get similar benefit.  -make sure fully recovered from flu first before starting  Recommended follow up: Return for next already scheduled visit or sooner if needed. - may recommend mid year check if you do go on the Ozempic and we titrate dose

## 2023-08-16 ENCOUNTER — Telehealth: Payer: Self-pay | Admitting: Pharmacist

## 2023-08-16 NOTE — Telephone Encounter (Signed)
 Mychart message has been sent.

## 2023-08-16 NOTE — Telephone Encounter (Signed)
 Ozempic  was denied-please let patient know

## 2023-08-16 NOTE — Telephone Encounter (Signed)
 FYI

## 2023-08-16 NOTE — Telephone Encounter (Signed)
 Ozempic  is approved exclusively as an adjunct to diet and exercise to improve glycemic control in adults with type 2 diabetes mellitus. A review of [facility]. [facility] medical chart reveals no documented diagnosis of type 2 diabetes or an elevated A1C. Therefore, he does not currently meet the criteria for prior authorization of this medication. If clinically appropriate, alternative options such as Saxenda, Zepbound , or Georjean may be considered for this patient.  Thank you, Devere Pandy, PharmD Clinical Pharmacist    Direct Dial: [phone]

## 2023-08-29 ENCOUNTER — Encounter: Payer: Self-pay | Admitting: Cardiovascular Disease

## 2023-08-29 ENCOUNTER — Other Ambulatory Visit: Payer: Self-pay | Admitting: Family

## 2023-08-29 ENCOUNTER — Other Ambulatory Visit: Payer: Self-pay | Admitting: Family Medicine

## 2023-08-29 ENCOUNTER — Ambulatory Visit: Payer: Medicare Other | Attending: Cardiovascular Disease | Admitting: Cardiovascular Disease

## 2023-08-29 VITALS — BP 140/80 | HR 82 | Ht 72.0 in | Wt 193.8 lb

## 2023-08-29 DIAGNOSIS — E7800 Pure hypercholesterolemia, unspecified: Secondary | ICD-10-CM

## 2023-08-29 DIAGNOSIS — I2510 Atherosclerotic heart disease of native coronary artery without angina pectoris: Secondary | ICD-10-CM | POA: Diagnosis not present

## 2023-08-29 NOTE — Progress Notes (Signed)
 Chief Complaint  Patient presents with   Follow-up    CAD   History of Present Illness: 78 yo male with history of CAD, kidney stones and GERD here today for cardiac follow up. Cardiac cath September 2013 with severe mid LAD stenosis involving the Diagonal branch. The LAD was treated with a drug eluting stent and angioplasty was performed on the ostium of the diagonal. Recurrent chest pain November 2013 with cardiac cath 06/01/12 demonstrating stable disease with patency of LAD stent. Stress test in February 2017 in [place] and he had no ischemia. Echo March 2024 with LVEF=60-65%.No significant valve disease is noted.   He is here today for follow up. The patient denies any chest pain, dyspnea, palpitations, lower extremity edema, orthopnea, PND, dizziness, near syncope or syncope.    Primary Care Physician: Shelva Majestic, MD  Past Medical History:  Diagnosis Date   Coronary artery disease    a. ETT 9/13: + ischemic ECG changes;  b. ETT-MV 9/13: EF 62%;  + ischemic EKG changes and + inf ischemia;  c. LHC 9/13: pLAD 30%, mLAD 70-80%, oDx 40-50% ==> FFR hemodynamically significant stenosis in mLAD ==> PCI: Xience Xpedition DES to mLAD; Cath 06/01/12   single vessel CAD w/ patent mid LAD stent and normal LV systolic function   Fibromyalgia    muscle aches in past in both forearms, before statin even   GERD (gastroesophageal reflux disease)    Hyperlipidemia LDL goal < 70    Nephrolithiasis    Testosterone deficiency 04/28/2008   History treatment around 2009. No current treatment.       Past Surgical History:  Procedure Laterality Date   CARDIAC CATHETERIZATION  04/06/2012; 06/01/2012   CATARACT EXTRACTION W/ INTRAOCULAR LENS IMPLANT  08/2011   right .Left 12/2014.    CORONARY ANGIOPLASTY WITH STENT PLACEMENT  04/11/2012   DES LAD   CYSTOSCOPY W/ STONE MANIPULATION  2009   INGUINAL HERNIA REPAIR  1990's   right   KNEE ARTHROSCOPY  1990's   right   LEFT HEART CATHETERIZATION  WITH CORONARY ANGIOGRAM N/A 06/01/2012   Procedure: LEFT HEART CATHETERIZATION WITH CORONARY ANGIOGRAM;  Surgeon: Kathleene Hazel, MD;  [facility] C[facility] LAB;  Service: Cardiovascular;  Laterality: N/A;   LYMPHADENECTOMY Bilateral 08/18/2014   Procedure: LYMPHADENECTOMY;  Surgeon: Heloise Purpura, MD;  Location: [facility] ORS;  Service: Urology;  Laterality: Bilateral;   PERCUTANEOUS CORONARY STENT INTERVENTION (PCI-S) N/A 04/11/2012   Procedure: PERCUTANEOUS CORONARY STENT INTERVENTION (PCI-S);  Surgeon: Kathleene Hazel, MD;  Location: [facility] CATH LAB;  Service: Cardiovascular;  Laterality: N/A;   ROBOT ASSISTED LAPAROSCOPIC RADICAL PROSTATECTOMY N/A 08/18/2014   Procedure: ROBOTIC ASSISTED LAPAROSCOPIC RADICAL PROSTATECTOMY LEVEL 2;  Surgeon: Heloise Purpura, MD;  Location: [facility] ORS;  Service: Urology;  Laterality: N/A;    Current Outpatient Medications  Medication Sig Dispense Refill   atorvastatin (LIPITOR) 40 MG tablet TAKE 1 TABLET BY MOUTH DAILY 100 tablet 2   busPIRone (BUSPAR) 5 MG tablet TAKE 1 TABLET BY MOUTH TWICE  DAILY AS NEEDED 200 tablet 0   chlorpheniramine (RA CHLORPHENIRAMINE MALEATE) 4 MG tablet      cholecalciferol (VITAMIN D3) 25 MCG (1000 UNIT) tablet Take 4,000 Units by mouth daily.     clopidogrel (PLAVIX) 75 MG tablet Take 1 tablet (75 mg total) by mouth daily. 90 tablet 1   cyanocobalamin 1000 MCG tablet Take 1,000 mcg by mouth daily.     EPINEPHrine 0.3 mg/0.3 mL IJ SOAJ injection Inject 0.3 mg  into the muscle as needed for anaphylaxis. 2 each 1   nitroGLYCERIN (NITROSTAT) 0.4 MG SL tablet PLACE 1 TABLET UNDER THE TONGUE EVERY 5 MINUTES AS NEEDED FOR CHEST PAIN. 25 tablet 5   pantoprazole (PROTONIX) 40 MG tablet TAKE 1 TABLET BY MOUTH DAILY 100 tablet 2   polyvinyl alcohol (LIQUIFILM TEARS) 1.4 % ophthalmic solution Place 1 drop into both eyes daily as needed for dry eyes.     sodium chloride (OCEAN) 0.65 % SOLN nasal spray Place 1 spray into both nostrils daily as needed  for congestion.     traZODone (DESYREL) 50 MG tablet TAKE 1/2 TO 1 TABLET BY MOUTH AT BEDTIME AS NEEDED FOR SLEEP 90 tablet 3   No current facility-administered medications for this visit.    Allergies  Allergen Reactions   Sulfa Antibiotics Rash    rash    Social History   Socioeconomic History   Marital status: Married    Spouse name: Not on file   Number of children: 2   Years of education: Not on file   Highest education level: Master's degree (e.g., MA, MS, MEng, MEd, MSW, MBA)  Occupational History   Occupation: Retired  Tobacco Use   Smoking status: Never   Smokeless tobacco: Never  Substance and Sexual Activity   Alcohol use: No   Drug use: No   Sexual activity: Not Currently  Other Topics Concern   Not on file  Social History Narrative   Married (brother patient here, wife patient elsewhere). 2 children and 2 grandkids 4 and 2 in 2022 (daughter  in [place] with 2 kids, son in [place]-working with [employer]- Administrator, sports).       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)   -wife retiring 2022      Hobbies: golf, work in yard, walking with wife   Social Drivers of Corporate investment banker Strain: Low Risk  (08/08/2023)   Overall Financial Resource Strain (CARDIA)    Difficulty of Paying Living Expenses: Not hard at all  Food Insecurity: No Food Insecurity (08/08/2023)   Hunger Vital Sign    Worried About Running Out of Food in the Last Year: Never true    Ran Out of Food in the Last Year: Never true  Transportation Needs: No Transportation Needs (08/08/2023)   PRAPARE - Administrator, Civil Service (Medical): No    Lack of Transportation (Non-Medical): No  Physical Activity: Sufficiently Active (08/08/2023)   Exercise Vital Sign    Days of Exercise per Week: 5 days    Minutes of Exercise per Session: 30 min  Stress: No Stress Concern Present (08/08/2023)   [employer] of Occupational  Health - Occupational Stress Questionnaire    Feeling of Stress : Only a little  Social Connections: Socially Integrated (08/08/2023)   Social Connection and Isolation Panel [NHANES]    Frequency of Communication with Friends and Family: More than three times a week    Frequency of Social Gatherings with Friends and Family: Twice a week    Attends Religious Services: More than 4 times per year    Active Member of [employer] or Organizations: Yes    Attends Engineer, structural: More than 4 times per year    Marital Status: Married  Catering manager Violence: Not At Risk (06/20/2023)   Humiliation, Afraid, Rape, and Kick questionnaire    Fear of Current or Ex-Partner: No    Emotionally Abused:  No    Physically Abused: No    Sexually Abused: No    Family History  Problem Relation Age of Onset   Diabetes Mother    Heart attack Mother 54   Diabetes Father    Heart attack Father 15   Diabetes Sister    Arthritis Brother     Review of Systems:  As stated in the HPI and otherwise negative.   BP (!) 140/80   Pulse 82   Ht 6' (1.829 m)   Wt 87.9 kg   SpO2 98%   BMI 26.28 kg/m   Physical Examination: General: Well developed, well nourished, NAD  HEENT: OP clear, mucus membranes moist  SKIN: warm, dry. No rashes. Neuro: No focal deficits  Musculoskeletal: Muscle strength 5/5 all ext  Psychiatric: Mood and affect normal  Neck: No JVD, no carotid bruits, no thyromegaly, no lymphadenopathy.  Lungs:Clear bilaterally, no wheezes, rhonci, crackles Cardiovascular: Regular rate and rhythm. No murmurs, gallops or rubs. Abdomen:Soft. Bowel sounds present. Non-tender.  Extremities: No lower extremity edema. Pulses are 2 + in the bilateral DP/PT.  EKG:  EKG is ordered today. The ekg ordered today demonstrates  EKG Interpretation Date/Time:  Tuesday August 29 2023 15:22:40 EST Ventricular Rate:  75 PR Interval:  150 QRS Duration:  86 QT Interval:  398 QTC Calculation: 444 R  Axis:   44  Text Interpretation: Sinus rhythm with Premature supraventricular complexes Confirmed by Verne Carrow [phone]) on 08/29/2023 3:26:58 PM    Recent Labs: 04/07/2023: ALT 15; BUN 19; Creatinine, Ser 1.02; Hemoglobin 13.4; Platelets 208.0; Potassium 4.4; Sodium 140   Lipid Panel    Component Value Date/Time   CHOL 132 04/07/2023 1020   TRIG 140.0 04/07/2023 1020   HDL 48.60 04/07/2023 1020   CHOLHDL 3 04/07/2023 1020   VLDL 28.0 04/07/2023 1020   LDLCALC 56 04/07/2023 1020   LDLCALC 77 02/25/2020 0953   LDLDIRECT 86.0 10/16/2015 0841     Wt Readings from Last 3 Encounters:  08/29/23 87.9 kg  08/14/23 87.6 kg  08/08/23 88.6 kg    Assessment and Plan:   1. CAD without angina: No chest pain. BP is well controlled at home. LV function normal by echo in March 2024. He is not on a beta blocker due to bradycardia. Continue Plavix and Lipitor.    2. HLD: LDL 56 in September 2024. Continue Lipitor  Labs/ tests ordered today include:   Orders Placed This Encounter  Procedures   EKG 12-Lead   Disposition:   F/U with me in 12  months  Signed, Verne Carrow, MD 08/29/2023 3:34 PM    [facility] [address] [place], [place], [place]  [address] Phone: [phone]; Fax: [phone]

## 2023-08-29 NOTE — Patient Instructions (Signed)
Medication Instructions:  No changes *If you need a refill on your cardiac medications before your next appointment, please call your pharmacy*   Lab Work: none If you have labs (blood work) drawn today and your tests are completely normal, you will receive your results only by: [place] (if you have MyChart) OR A paper copy in the mail If you have any lab test that is abnormal or we need to change your treatment, we will call you to review the results.   Testing/Procedures: none   Follow-Up: At [facility], you and your health needs are our priority.  As part of our continuing mission to provide you with exceptional heart care, we have created designated Provider Care Teams.  These Care Teams include your primary Cardiologist (physician) and Advanced Practice Providers (APPs -  Physician Assistants and Nurse Practitioners) who all work together to provide you with the care you need, when you need it.  We recommend signing up for the patient portal called "MyChart".  Sign up information is provided on this After Visit Summary.  MyChart is used to connect with patients for Virtual Visits (Telemedicine).  Patients are able to view lab/test results, encounter notes, upcoming appointments, etc.  Non-urgent messages can be sent to your provider as well.   To learn more about what you can do with MyChart, go to [email or web address].    Your next appointment:   12 month(s)  Provider:   Lauree Chandler, MD     Other Instructions

## 2023-09-19 ENCOUNTER — Other Ambulatory Visit: Payer: Self-pay

## 2023-09-19 ENCOUNTER — Telehealth: Payer: Self-pay | Admitting: Cardiovascular Disease

## 2023-09-19 MED ORDER — CLOPIDOGREL BISULFATE 75 MG PO TABS
75.0000 mg | ORAL_TABLET | Freq: Every day | ORAL | 2 refills | Status: DC
Start: 2023-09-19 — End: 2024-02-26

## 2023-09-19 NOTE — Telephone Encounter (Signed)
*  STAT* If patient is at the pharmacy, call can be transferred to refill team.   1. Which medications need to be refilled? (please list name of each medication and dose if known)   clopidogrel (PLAVIX) 75 MG tablet   2. Would you like to learn more about the convenience, safety, & potential cost savings by using the [facility]?   3. Are you open to using the [facility] (Type [facility]. ).  4. Which pharmacy/location (including street and city if local pharmacy) is medication to be sent to?  [facility] - [place], [place] - [address]   5. Do they need a 30 day or 90 day supply?     Patient stated he has a little medication left.

## 2023-10-02 ENCOUNTER — Other Ambulatory Visit (HOSPITAL_COMMUNITY): Payer: Self-pay

## 2023-10-20 ENCOUNTER — Encounter: Payer: Self-pay | Admitting: Pharmacy Technician

## 2023-10-20 ENCOUNTER — Other Ambulatory Visit (HOSPITAL_COMMUNITY): Payer: Self-pay

## 2023-10-20 NOTE — Telephone Encounter (Signed)
error 

## 2023-10-20 NOTE — Telephone Encounter (Signed)
 ERROR

## 2023-10-30 ENCOUNTER — Other Ambulatory Visit: Payer: Self-pay | Admitting: Family Medicine

## 2023-10-31 ENCOUNTER — Other Ambulatory Visit: Payer: Self-pay | Admitting: Family Medicine

## 2023-11-05 ENCOUNTER — Encounter: Payer: Self-pay | Admitting: Family Medicine

## 2023-11-06 ENCOUNTER — Encounter: Payer: Self-pay | Admitting: Family Medicine

## 2023-11-07 DIAGNOSIS — H02831 Dermatochalasis of right upper eyelid: Secondary | ICD-10-CM | POA: Diagnosis not present

## 2023-11-07 DIAGNOSIS — H02832 Dermatochalasis of right lower eyelid: Secondary | ICD-10-CM | POA: Diagnosis not present

## 2023-11-07 DIAGNOSIS — H02834 Dermatochalasis of left upper eyelid: Secondary | ICD-10-CM | POA: Diagnosis not present

## 2023-11-07 DIAGNOSIS — H02835 Dermatochalasis of left lower eyelid: Secondary | ICD-10-CM | POA: Diagnosis not present

## 2023-11-15 ENCOUNTER — Encounter: Payer: Self-pay | Admitting: Family Medicine

## 2023-12-01 ENCOUNTER — Telehealth: Payer: Self-pay | Admitting: *Deleted

## 2023-12-01 ENCOUNTER — Telehealth: Payer: Self-pay

## 2023-12-01 NOTE — Telephone Encounter (Signed)
 Pt has been scheduled tele preop appt 12/07/23. Med rec and consent are done.      Patient Consent for Virtual Visit        Richard Mahoney has provided verbal consent on 12/01/2023 for a virtual visit (video or telephone).   CONSENT FOR VIRTUAL VISIT FOR:  Richard Mahoney  By participating in this virtual visit I agree to the following:  I hereby voluntarily request, consent and authorize [facility] and its employed or contracted physicians, physician assistants, nurse practitioners or other licensed health care professionals (the Practitioner), to provide me with telemedicine health care services (the "Services") as deemed necessary by the treating Practitioner. I acknowledge and consent to receive the Services by the Practitioner via telemedicine. I understand that the telemedicine visit will involve communicating with the Practitioner through live audiovisual communication technology and the disclosure of certain medical information by electronic transmission. I acknowledge that I have been given the opportunity to request an in-person assessment or other available alternative prior to the telemedicine visit and am voluntarily participating in the telemedicine visit.  I understand that I have the right to withhold or withdraw my consent to the use of telemedicine in the course of my care at any time, without affecting my right to future care or treatment, and that the Practitioner or I may terminate the telemedicine visit at any time. I understand that I have the right to inspect all information obtained and/or recorded in the course of the telemedicine visit and may receive copies of available information for a reasonable fee.  I understand that some of the potential risks of receiving the Services via telemedicine include:  Delay or interruption in medical evaluation due to technological equipment failure or disruption; Information transmitted may not be sufficient (e.g. poor  resolution of images) to allow for appropriate medical decision making by the Practitioner; and/or  In rare instances, security protocols could fail, causing a breach of personal health information.  Furthermore, I acknowledge that it is my responsibility to provide information about my medical history, conditions and care that is complete and accurate to the best of my ability. I acknowledge that Practitioner's advice, recommendations, and/or decision may be based on factors not within their control, such as incomplete or inaccurate data provided by me or distortions of diagnostic images or specimens that may result from electronic transmissions. I understand that the practice of medicine is not an exact science and that Practitioner makes no warranties or guarantees regarding treatment outcomes. I acknowledge that a copy of this consent can be made available to me via my patient portal [facility] MyChart), or I can request a printed copy by calling the office of [facility].    I understand that my insurance will be billed for this visit.   I have read or had this consent read to me. I understand the contents of this consent, which adequately explains the benefits and risks of the Services being provided via telemedicine.  I have been provided ample opportunity to ask questions regarding this consent and the Services and have had my questions answered to my satisfaction. I give my informed consent for the services to be provided through the use of telemedicine in my medical care

## 2023-12-01 NOTE — Telephone Encounter (Signed)
 Pt has been scheduled tele preop appt 12/07/23. Med rec and consent are done.

## 2023-12-01 NOTE — Telephone Encounter (Signed)
   Pre-operative Risk Assessment    Patient Name: Richard Mahoney  DOB: 07-22-1945 MRN: [id number]   Date of last office visit: 08/29/23 Date of next office visit: n/a   Request for Surgical Clearance    Procedure:  Blepharoplasty, both upper and lower lids and CO2  Date of Surgery:  Clearance 12/15/23                                 Surgeon:  Dr. Nancylee Ba. White  Surgeon's Group or Practice Name:  [facility] number:  [phone] Fax number:  [phone]   Type of Clearance Requested:   - Medical  - Pharmacy:  Hold Clopidogrel  (Plavix ) 5 days before surgery    Type of Anesthesia:  IV sedation    Additional requests/questions:    Mansfield Seip   12/01/2023, 8:57 AM

## 2023-12-01 NOTE — Telephone Encounter (Signed)
   Name: Richard Mahoney  DOB: [birth year]  MRN: [id number]  Primary Cardiologist: Antoinette Batman, MD   Preoperative team, please contact this patient and set up a phone call appointment for further preoperative risk assessment. Please obtain consent and complete medication review. Thank you for your help.  I confirm that guidance regarding antiplatelet and oral anticoagulation therapy has been completed and, if necessary, noted below.  Patient can hold Plavix  5 days prior to procedure and should restart postprocedure when surgically safe.  I also confirmed the patient resides in the state of [place] . As per [facility] Medical Board telemedicine laws, the patient must reside in the state in which the provider is licensed.   Francene Ing, Retha Cast, NP 12/01/2023, 9:01 AM [facility]

## 2023-12-07 ENCOUNTER — Ambulatory Visit: Attending: Internal Medicine | Admitting: Nurse Practitioner

## 2023-12-07 DIAGNOSIS — Z01810 Encounter for preprocedural cardiovascular examination: Secondary | ICD-10-CM | POA: Diagnosis not present

## 2023-12-07 NOTE — Progress Notes (Signed)
 Virtual Visit via Telephone Note   Because of ADE STMARIE co-morbid illnesses, he is at least at moderate risk for complications without adequate follow up.  This format is felt to be most appropriate for this patient at this time.  Due to technical limitations with video connection (technology), today's appointment will be conducted as an audio only telehealth visit, and BLANTON KARDELL verbally agreed to proceed in this manner.   All issues noted in this document were discussed and addressed.  No physical exam could be performed with this format.  Evaluation Performed:  Preoperative cardiovascular risk assessment _____________   Date:  12/07/2023   Patient ID:  Richard Mahoney, DOB July 29, 1945, MRN [id number] Patient Location:  Home Provider location:   Office  Primary Care Provider:  Almira Jaeger, MD Primary Cardiologist:  Antoinette Batman, MD  Chief Complaint / Patient Profile   78 y.o. y/o male with a h/o CAD s/p DES-LAD, PTCA-Diag in 2013, hyperlipidemia, and GERD who is pending Blepharoplasty, both upper and lower lids and CO2 on 12/15/2023 with Dr. Aleatha Hunting of [facility] and presents today for telephonic preoperative cardiovascular risk assessment.  History of Present Illness    Richard Mahoney is a 78 y.o. male who presents via audio/video conferencing for a telehealth visit today.  Pt was last seen in cardiology clinic on 08/29/2023 by Dr. Abel Hoe. At that time TYSHAUN VINZANT was doing well.  The patient is now pending procedure as outlined above. Since his last visit, he has done well from a cardiac standpoint.  He is active, he looks after his 2 grandchildren.  He exercises on a treadmill for 30 minutes at a time approximately 5 days a week.  He denies chest pain, palpitations, dyspnea, pnd, orthopnea, n, v, dizziness, syncope, edema, weight gain, or early satiety. All other systems reviewed and are otherwise negative except as noted above.    Past Medical History    Past Medical History:  Diagnosis Date   Coronary artery disease    a. ETT 9/13: + ischemic ECG changes;  b. ETT-MV 9/13: EF 62%;  + ischemic EKG changes and + inf ischemia;  c. LHC 9/13: pLAD 30%, mLAD 70-80%, oDx 40-50% ==> FFR hemodynamically significant stenosis in mLAD ==> PCI: Xience Xpedition DES to mLAD; Cath 06/01/12   single vessel CAD w/ patent mid LAD stent and normal LV systolic function   Fibromyalgia    muscle aches in past in both forearms, before statin even   GERD (gastroesophageal reflux disease)    Hyperlipidemia LDL goal < 70    Nephrolithiasis    Testosterone deficiency 04/28/2008   History treatment around 2009. No current treatment.      Past Surgical History:  Procedure Laterality Date   CARDIAC CATHETERIZATION  04/06/2012; 06/01/2012   CATARACT EXTRACTION W/ INTRAOCULAR LENS IMPLANT  08/2011   right .Left 12/2014.    CORONARY ANGIOPLASTY WITH STENT PLACEMENT  04/11/2012   DES LAD   CYSTOSCOPY W/ STONE MANIPULATION  2009   INGUINAL HERNIA REPAIR  1990's   right   KNEE ARTHROSCOPY  1990's   right   LEFT HEART CATHETERIZATION WITH CORONARY ANGIOGRAM N/A 06/01/2012   Procedure: LEFT HEART CATHETERIZATION WITH CORONARY ANGIOGRAM;  Surgeon: Odie Benne, MD;  Location: [facility] CATH LAB;  Service: Cardiovascular;  Laterality: N/A;   LYMPHADENECTOMY Bilateral 08/18/2014   Procedure: LYMPHADENECTOMY;  Surgeon: Florencio Hunting, MD;  Location: [facility] ORS;  Service: Urology;  Laterality: Bilateral;  PERCUTANEOUS CORONARY STENT INTERVENTION (PCI-S) N/A 04/11/2012   Procedure: PERCUTANEOUS CORONARY STENT INTERVENTION (PCI-S);  Surgeon: Odie Benne, MD;  Location: [facility] CATH LAB;  Service: Cardiovascular;  Laterality: N/A;   ROBOT ASSISTED LAPAROSCOPIC RADICAL PROSTATECTOMY N/A 08/18/2014   Procedure: ROBOTIC ASSISTED LAPAROSCOPIC RADICAL PROSTATECTOMY LEVEL 2;  Surgeon: Florencio Hunting, MD;  Location: [facility] ORS;  Service: Urology;  Laterality: N/A;     Allergies  Allergies  Allergen Reactions   Sulfa Antibiotics Rash    rash    Home Medications    Prior to Admission medications   Medication Sig Start Date End Date Taking? Authorizing Provider  atorvastatin  (LIPITOR) 40 MG tablet TAKE 1 TABLET BY MOUTH DAILY 10/31/23   Almira Jaeger, MD  busPIRone  (BUSPAR ) 5 MG tablet TAKE 1 TABLET BY MOUTH TWICE  DAILY AS NEEDED Patient not taking: Reported on 12/01/2023 11/01/23   Almira Jaeger, MD  chlorpheniramine (RA CHLORPHENIRAMINE MALEATE) 4 MG tablet     [provider]  cholecalciferol (VITAMIN D3) 25 MCG (1000 UNIT) tablet Take 4,000 Units by mouth daily.    [provider]  clopidogrel  (PLAVIX ) 75 MG tablet Take 1 tablet (75 mg total) by mouth daily. 09/19/23   Odie Benne, MD  cyanocobalamin  1000 MCG tablet Take 1,000 mcg by mouth daily.    [provider]  EPINEPHrine  0.3 mg/0.3 mL IJ SOAJ injection Inject 0.3 mg into the muscle as needed for anaphylaxis. 01/31/23   Almira Jaeger, MD  nitroGLYCERIN  (NITROSTAT ) 0.4 MG SL tablet PLACE 1 TABLET UNDER THE TONGUE EVERY 5 MINUTES AS NEEDED FOR CHEST PAIN. 02/17/22   Odie Benne, MD  pantoprazole  (PROTONIX ) 40 MG tablet TAKE 1 TABLET BY MOUTH DAILY 08/30/23   Almira Jaeger, MD  polyvinyl alcohol  (LIQUIFILM TEARS) 1.4 % ophthalmic solution Place 1 drop into both eyes daily as needed for dry eyes.    [provider]  sodium chloride  (OCEAN) 0.65 % SOLN nasal spray Place 1 spray into both nostrils daily as needed for congestion.    [provider]  traZODone  (DESYREL ) 50 MG tablet TAKE 1/2 TO 1 TABLET BY MOUTH AT BEDTIME AS NEEDED FOR SLEEP Patient not taking: Reported on 12/01/2023 11/01/23   Almira Jaeger, MD    Physical Exam    Vital Signs:  COSTON MANDATO does not have vital signs available for review today.  Given telephonic nature of communication, physical exam is limited. AAOx3. NAD. Normal affect.   Speech and respirations are unlabored.  Accessory Clinical Findings    None  Assessment & Plan    1.  Preoperative Cardiovascular Risk Assessment:  According to the Revised Cardiac Risk Index (RCRI), his Perioperative Risk of Major Cardiac Event is (%): 0.9. His Functional Capacity in METs is: 7.34 according to the Duke Activity Status Index (DASI). Therefore, based on ACC/AHA guidelines, patient would be at acceptable risk for the planned procedure without further cardiovascular testing.  The patient was advised that if he develops new symptoms prior to surgery to contact our office to arrange for a follow-up visit, and he verbalized understanding.  Per office protocol, patient may hold Plavix  for 5 days prior to procedure (patient was previously cleared by MD to hold Plavix ). Please resume Plavix  as soon as possible postprocedure, the discretion of the surgeon.  A copy of this note will be routed to requesting surgeon.  Time:   Today, I have spent 5 minutes with the patient with telehealth technology discussing medical  history, symptoms, and management plan.     Jude Norton, NP  12/07/2023, 2:45 PM

## 2023-12-08 ENCOUNTER — Encounter: Payer: Self-pay | Admitting: Family Medicine

## 2023-12-08 ENCOUNTER — Ambulatory Visit (INDEPENDENT_AMBULATORY_CARE_PROVIDER_SITE_OTHER): Admitting: Family Medicine

## 2023-12-08 VITALS — BP 120/80 | HR 74 | Temp 97.5°F | Ht 72.0 in | Wt 196.2 lb

## 2023-12-08 DIAGNOSIS — I2510 Atherosclerotic heart disease of native coronary artery without angina pectoris: Secondary | ICD-10-CM | POA: Diagnosis not present

## 2023-12-08 DIAGNOSIS — R350 Frequency of micturition: Secondary | ICD-10-CM | POA: Diagnosis not present

## 2023-12-08 DIAGNOSIS — E785 Hyperlipidemia, unspecified: Secondary | ICD-10-CM | POA: Diagnosis not present

## 2023-12-08 DIAGNOSIS — R3915 Urgency of urination: Secondary | ICD-10-CM

## 2023-12-08 LAB — POC URINALSYSI DIPSTICK (AUTOMATED)
Bilirubin, UA: NEGATIVE
Blood, UA: NEGATIVE
Glucose, UA: NEGATIVE
Ketones, UA: NEGATIVE
Nitrite, UA: NEGATIVE
Protein, UA: NEGATIVE
Spec Grav, UA: >=1.03 — AB (ref 1.010–1.025)
Urobilinogen, UA: 0.2 U/dL
pH, UA: 6 (ref 5.0–8.0)

## 2023-12-08 NOTE — Progress Notes (Signed)
 Phone [phone] In person visit   Subjective:   Richard Mahoney is a 78 y.o. year old very pleasant male patient who presents for/with See problem oriented charting Chief Complaint  Patient presents with   sleep issues    Pt c/o continued sleep issues and thinks it could be related to his kidneys   Past Medical History-  Patient Active Problem List   Diagnosis Date Noted   History of prostate cancer 08/18/2014    Priority: High   Coronary atherosclerosis of native coronary artery 04/12/2012    Priority: High   GERD (gastroesophageal reflux disease) 03/17/2015    Priority: Medium    Hyperlipidemia 04/25/2012    Priority: Medium    Insomnia 04/22/2010    Priority: Low   NEPHROLITHIASIS, HX OF 03/26/2008    Priority: Low    Medications- reviewed and updated Current Outpatient Medications  Medication Sig Dispense Refill   atorvastatin  (LIPITOR) 40 MG tablet TAKE 1 TABLET BY MOUTH DAILY 100 tablet 2   chlorpheniramine (RA CHLORPHENIRAMINE MALEATE) 4 MG tablet      cholecalciferol (VITAMIN D3) 25 MCG (1000 UNIT) tablet Take 4,000 Units by mouth daily.     clopidogrel  (PLAVIX ) 75 MG tablet Take 1 tablet (75 mg total) by mouth daily. 90 tablet 2   cyanocobalamin  1000 MCG tablet Take 1,000 mcg by mouth daily.     EPINEPHrine  0.3 mg/0.3 mL IJ SOAJ injection Inject 0.3 mg into the muscle as needed for anaphylaxis. 2 each 1   nitroGLYCERIN  (NITROSTAT ) 0.4 MG SL tablet PLACE 1 TABLET UNDER THE TONGUE EVERY 5 MINUTES AS NEEDED FOR CHEST PAIN. 25 tablet 5   pantoprazole  (PROTONIX ) 40 MG tablet TAKE 1 TABLET BY MOUTH DAILY 100 tablet 2   polyvinyl alcohol  (LIQUIFILM TEARS) 1.4 % ophthalmic solution Place 1 drop into both eyes daily as needed for dry eyes.     sodium chloride  (OCEAN) 0.65 % SOLN nasal spray Place 1 spray into both nostrils daily as needed for congestion.     No current facility-administered medications for this visit.     Objective:  BP 120/80   Pulse 74   Temp  (!) 97.5 F (36.4 C)   Ht 6' (1.829 m)   Wt 196 lb 3.2 oz (89 kg)   SpO2 95%   BMI 26.61 kg/m  Gen: NAD, resting comfortably CV: RRR no murmurs rubs or gallops Lungs: CTAB no crackles, wheeze, rhonchi Abdomen: soft/nontender other than mild suprapubic pain/nondistended/normal bowel sounds. No rebound or guarding.  Ext: no edema Skin: warm, dry  Results for orders placed or performed in visit on 12/08/23 (from the past 24 hours)  POCT Urinalysis Dipstick (Automated)     Status: Abnormal   Collection Time: 12/08/23 11:22 AM  Result Value Ref Range   Color, UA yellow    Clarity, UA clear    Glucose, UA Negative Negative   Bilirubin, UA neg    Ketones, UA neg    Spec Grav, UA >=1.030 (A) 1.010 - 1.025   Blood, UA neg    pH, UA 6.0 5.0 - 8.0   Protein, UA Negative Negative   Urobilinogen, UA 0.2 0.2 or 1.0 E.U./dL   Nitrite, UA neg    Leukocytes, UA 4+ (A) Negative       Assessment and Plan    # insomnia #polyuria and urinary urgency S:patient has trialed trazodone  in past without benefit and he was also concerned about possible side effects with that and buspirone . Also prior alprazolam   by cardiology with concern for anxiety and we have also tried buspirone - none of these have really helped.  -he tried melatonin at 5 mg and 4 mg - fell asleep within hour but then was waking up ever 2-25 hours and went to urinate- before falling into a light sleep -sleep issues and urinary issues seemed to start well over a month ago and together  Later noted ever 2-25 hours need to go to bathroom in daytime with a heavy uncomfortable pressure in his penis that would escalate until urination. Urgency would quickly come back eery 2-2.5 hours similar to nighttime.   -reports had prostatitis years ago - no discharge -maybe some mild incontinence or dribbling with some staining in underwear. pad at night but is not finding it wet.  A/P: Patient with insomnia issues for at least a couple months  now that may ultimately be related to urinary symptoms (polyuria, urgency, suprapubic pressure)-UA concerning for UTI with 4+ leukocytes.  No glucose noted in the urine so we will hold off on assessing blood sugar/A1c.  History of prostatectomy with benign rectal exam-he thought may have left some prostate tissue but I confirmed they did not on exam and based on prior PSA - Offered empiric antibiotics versus waiting until culture data available likely on Monday-he prefers to wait to make sure he is the right antibiotic for some -Monogamous so STD testing not needed and no penile discharge  #CAD #Hyperlipidemia-LDL goal under 70 S: Medications: Atorvastatin  40 mg daily- Also compliant with Plavix -not on aspirin .  -has never had to use nitroglycerin .  Follows with Dr. Abel Hoe Lab Results  Component Value Date   CHOL 132 04/07/2023   HDL 48.60 04/07/2023   LDLCALC 56 04/07/2023   LDLDIRECT 86.0 10/16/2015   TRIG 140.0 04/07/2023   CHOLHDL 3 04/07/2023  -No chest pain or shortness of breath reported  A/P: CAD asymptomatic and lipids at goal-continue current rotation  Recommended follow up: Return for as needed for new, worsening, persistent symptoms. Future Appointments  Date Time Provider Department Center  06/24/2024  9:00 AM Almira Jaeger, MD [facility]  07/01/2024  2:20 PM [facility] ANNUAL WELLNESS VISIT 1 [facility]    Lab/Order associations:   ICD-10-CM   1. Frequency of urination  R35.0 POCT Urinalysis Dipstick (Automated)    Urine Culture    CANCELED: Urine Culture    2. Urinary urgency  R39.15     3. Atherosclerosis of native coronary artery of native heart without angina pectoris  I25.10     4. Hyperlipidemia, unspecified hyperlipidemia type  E78.5       No orders of the defined types were placed in this encounter.   Return precautions advised.  Clarisa Crooked, MD

## 2023-12-08 NOTE — Patient Instructions (Addendum)
 Suspect urinary tract infection - we will await urine culture for final results and to make sure we give you the right antibiotics but agree with you that is the likely issue. No obvious remaining prostate tissue  Recommended follow up: Return for as needed for new, worsening, persistent symptoms.

## 2023-12-09 LAB — URINE CULTURE
MICRO NUMBER:: 16519958
Result:: NO GROWTH
SPECIMEN QUALITY:: ADEQUATE

## 2023-12-11 ENCOUNTER — Ambulatory Visit: Payer: Self-pay | Admitting: Family Medicine

## 2023-12-11 MED ORDER — TAMSULOSIN HCL 0.4 MG PO CAPS
0.4000 mg | ORAL_CAPSULE | Freq: Every day | ORAL | 3 refills | Status: DC
Start: 2023-12-11 — End: 2024-04-29

## 2023-12-11 NOTE — Addendum Note (Signed)
 Addended by: Almira Jaeger on: 12/11/2023 08:36 PM   Modules accepted: Orders

## 2023-12-12 ENCOUNTER — Ambulatory Visit: Admitting: Family Medicine

## 2023-12-15 DIAGNOSIS — H02834 Dermatochalasis of left upper eyelid: Secondary | ICD-10-CM | POA: Diagnosis not present

## 2023-12-15 DIAGNOSIS — I2510 Atherosclerotic heart disease of native coronary artery without angina pectoris: Secondary | ICD-10-CM | POA: Diagnosis not present

## 2023-12-15 DIAGNOSIS — H02835 Dermatochalasis of left lower eyelid: Secondary | ICD-10-CM | POA: Diagnosis not present

## 2023-12-15 DIAGNOSIS — Z411 Encounter for cosmetic surgery: Secondary | ICD-10-CM | POA: Diagnosis not present

## 2023-12-15 DIAGNOSIS — H02832 Dermatochalasis of right lower eyelid: Secondary | ICD-10-CM | POA: Diagnosis not present

## 2023-12-15 DIAGNOSIS — H02831 Dermatochalasis of right upper eyelid: Secondary | ICD-10-CM | POA: Diagnosis not present

## 2023-12-27 ENCOUNTER — Other Ambulatory Visit (HOSPITAL_COMMUNITY): Payer: Self-pay

## 2023-12-27 ENCOUNTER — Telehealth: Payer: Self-pay

## 2023-12-27 NOTE — Telephone Encounter (Signed)
 Ozempic Richard Mahoney is approved exclusively as an adjunct to diet and exercise to improve glycemic  control in adults with type 2 diabetes mellitus. A review of patient's medical chart reveals no  documented diagnosis of type 2 diabetes or an A1C indicative of diabetes. Therefore, they do not  currently meet the criteria for prior authorization of this medication. If clinically appropriate, alternative  options such as Saxenda, Zepbound, or Frederik Jansky may be considered for this patient.   Ran test bill for Zepbound, patient has plan benefit exclusion for non formulary medication. There is no Hemoglobin A1C test in patient chart

## 2023-12-28 DIAGNOSIS — H02834 Dermatochalasis of left upper eyelid: Secondary | ICD-10-CM | POA: Diagnosis not present

## 2023-12-28 DIAGNOSIS — H02831 Dermatochalasis of right upper eyelid: Secondary | ICD-10-CM | POA: Diagnosis not present

## 2024-01-29 ENCOUNTER — Encounter: Payer: Self-pay | Admitting: Family Medicine

## 2024-02-23 ENCOUNTER — Encounter: Payer: Self-pay | Admitting: Family Medicine

## 2024-02-23 ENCOUNTER — Other Ambulatory Visit: Payer: Self-pay | Admitting: Cardiovascular Disease

## 2024-02-23 MED ORDER — TRAZODONE HCL 50 MG PO TABS: 75.0000 mg | ORAL_TABLET | Freq: Every evening | ORAL | 5 refills | Status: DC | PRN

## 2024-02-23 NOTE — Addendum Note (Signed)
 Addended by: KATRINKA GARNETTE KIDD on: 02/23/2024 12:44 PM   Modules accepted: Orders

## 2024-02-23 NOTE — Telephone Encounter (Signed)
**Note De-identified  [place] Obfuscation** Please advise 

## 2024-03-17 ENCOUNTER — Other Ambulatory Visit: Payer: Self-pay | Admitting: Family Medicine

## 2024-04-15 ENCOUNTER — Encounter: Payer: Self-pay | Admitting: Family Medicine

## 2024-04-16 ENCOUNTER — Encounter: Payer: Self-pay | Admitting: Family Medicine

## 2024-04-17 ENCOUNTER — Telehealth: Payer: Self-pay | Admitting: Family Medicine

## 2024-04-17 NOTE — Telephone Encounter (Signed)
 Spoke with Medford and let him know we received the paperwork today. Will fax back to number provided.    Copied from CRM [id number]. Topic: General - Phone/Fax/Address >> Apr 17, 2024 10:11 AM Carlyon BIRCH wrote:  Medford Rakers with [facility] calling in regards to a fax that was sent over yesterday that did not go through he states he tried again today and it went through he is just calling to confirm fax was received. Please update Mr. Medford with [facility] at  [phone] or call patient Mr. Geiman with update.

## 2024-04-25 NOTE — Telephone Encounter (Signed)
 Unfortunately the form states that he has to have a face to face visit specifically to discuss this so I cannot sign it at this time. This is a common issue with CPAP set ups and one of the reasons I generally suggest pulmonary because they understand the intricacies. We can schedule him with me or refer to pulmonary. I also believe on other side of this he will need a face to face to document compliance

## 2024-04-29 ENCOUNTER — Ambulatory Visit (INDEPENDENT_AMBULATORY_CARE_PROVIDER_SITE_OTHER): Admitting: Family Medicine

## 2024-04-29 ENCOUNTER — Encounter: Payer: Self-pay | Admitting: Family Medicine

## 2024-04-29 VITALS — BP 128/70 | HR 74 | Temp 97.9°F | Ht 72.0 in | Wt 199.2 lb

## 2024-04-29 DIAGNOSIS — G4733 Obstructive sleep apnea (adult) (pediatric): Secondary | ICD-10-CM | POA: Diagnosis not present

## 2024-04-29 DIAGNOSIS — Z23 Encounter for immunization: Secondary | ICD-10-CM

## 2024-04-29 NOTE — Patient Instructions (Signed)
 Sleep study next step- hoping getting closer to better sleep for you  Recommended follow up: after likely on CPAP for some period

## 2024-04-29 NOTE — Progress Notes (Signed)
 Phone [phone] In person visit   Subjective:   Richard Mahoney is a 78 y.o. year old very pleasant male patient who presents for/with See problem oriented charting Chief Complaint  Patient presents with   Sleep Apnea   Past Medical History-  Patient Active Problem List   Diagnosis Date Noted   History of prostate cancer 08/18/2014    Priority: High   Coronary atherosclerosis of native coronary artery 04/12/2012    Priority: High   GERD (gastroesophageal reflux disease) 03/17/2015    Priority: Medium    Hyperlipidemia 04/25/2012    Priority: Medium    Insomnia 04/22/2010    Priority: Low   NEPHROLITHIASIS, HX OF 03/26/2008    Priority: Low    Medications- reviewed and updated Current Outpatient Medications  Medication Sig Dispense Refill   atorvastatin  (LIPITOR) 40 MG tablet TAKE 1 TABLET BY MOUTH DAILY 100 tablet 2   chlorpheniramine (RA CHLORPHENIRAMINE MALEATE) 4 MG tablet      cholecalciferol (VITAMIN D3) 25 MCG (1000 UNIT) tablet Take 4,000 Units by mouth daily.     clopidogrel  (PLAVIX ) 75 MG tablet TAKE 1 TABLET BY MOUTH DAILY 90 tablet 1   cyanocobalamin  1000 MCG tablet Take 1,000 mcg by mouth daily.     EPINEPHrine  0.3 mg/0.3 mL IJ SOAJ injection Inject 0.3 mg into the muscle as needed for anaphylaxis. 2 each 1   nitroGLYCERIN  (NITROSTAT ) 0.4 MG SL tablet PLACE 1 TABLET UNDER THE TONGUE EVERY 5 MINUTES AS NEEDED FOR CHEST PAIN. 25 tablet 5   pantoprazole  (PROTONIX ) 40 MG tablet TAKE 1 TABLET BY MOUTH DAILY 100 tablet 2   polyvinyl alcohol  (LIQUIFILM TEARS) 1.4 % ophthalmic solution Place 1 drop into both eyes daily as needed for dry eyes.     sodium chloride  (OCEAN) 0.65 % SOLN nasal spray Place 1 spray into both nostrils daily as needed for congestion.     No current facility-administered medications for this visit.     Objective:  BP 128/70 (BP Location: Left Arm, Patient Position: Sitting, Cuff Size: Normal)   Pulse 74   Temp 97.9 F (36.6 C)  (Temporal)   Ht 6' (1.829 m)   Wt 199 lb 3.2 oz (90.4 kg)   SpO2 95%   BMI 27.02 kg/m  Gen: NAD, resting comfortably CV: RRR no murmurs rubs or gallops Lungs: CTAB no crackles, wheeze, rhonchi Abdomen: soft/nontender/nondistended/normal bowel sounds. No rebound or guarding.     Assessment and Plan    # Sleep difficulties S: Patient with sleep difficulties dating at least back to 12/08/2023 and at first we thought this could be related to urinary concerns even with prostatectomy with benign rectal exam and no obvious residual tissue. Previously had tried trazodone  without benefit.  Cardiology had also later tried alprazolam  with concern anxiety could be playing a role and we also tried buspirone  but none of these seem to be beneficial.  He has tried melatonin with poor benefit.  We had considered tamsulosin  as well but no prostate present.   In August he retried trazodone  at higher dose but had dry eye and extremely dry mouth as well as constipation which was severe so we had to discontinue.  We added this to his allergy list  He continues to sleep poorly in early October.  He reached out to [facility] patient and they did a trial of sleep oximetry and there was evidence of sleep apnea with over 30 oxygen desaturation events both nights he wore this.  Some of  his oxygen levels dropped as low as 81% he is requesting a sleep study appropriately so.  We went to fill out a form for this but they require a face-to-face visit  He reports ongoing significant issues with sleep.has been very tired from this continued battle.  A/P: highly probable this represents OSA- refer for sleep study formally today.      Recommended follow up: No follow-ups on file. Future Appointments  Date Time Provider Department Center  06/24/2024  9:00 AM Katrinka Garnette KIDD, MD [name]  07/01/2024  2:20 PM [facility] ANNUAL WELLNESS VISIT 1 [name]    Lab/Order associations:   ICD-10-CM    1. Immunization due  Z23 Flu vaccine HIGH DOSE PF(Fluzone Trivalent)      No orders of the defined types were placed in this encounter.   Return precautions advised.  Garnette Katrinka, MD

## 2024-04-30 ENCOUNTER — Encounter: Payer: Self-pay | Admitting: Family Medicine

## 2024-05-09 ENCOUNTER — Encounter: Payer: Self-pay | Admitting: Family Medicine

## 2024-05-10 ENCOUNTER — Encounter: Payer: Self-pay | Admitting: Family Medicine

## 2024-05-11 ENCOUNTER — Ambulatory Visit: Payer: Self-pay | Admitting: Family Medicine

## 2024-05-11 DIAGNOSIS — Q386 Other congenital malformations of mouth: Secondary | ICD-10-CM

## 2024-05-11 DIAGNOSIS — G4733 Obstructive sleep apnea (adult) (pediatric): Secondary | ICD-10-CM

## 2024-05-13 MED ORDER — ZEPBOUND 2.5 MG/0.5ML ~~LOC~~ SOAJ: 2.5000 mg | SUBCUTANEOUS | 5 refills | Status: DC

## 2024-05-15 ENCOUNTER — Telehealth: Payer: Self-pay | Admitting: Family Medicine

## 2024-05-15 NOTE — Telephone Encounter (Signed)
 Spoke with beth, she will be faxing form later today we will need to sign and send back.    Copied from CRM [id number]. Topic: Clinical - Order For Equipment >> May 14, 2024  4:44 PM Hadassah PARAS wrote: Reason for CRM: Order for c-pap machine is being sent incorrect w/o supplies. Landry is wanting to fax information over to be signed by PCP. Please advise Beth at [phone] opt 1

## 2024-05-20 ENCOUNTER — Other Ambulatory Visit: Payer: Self-pay | Admitting: Cardiovascular Disease

## 2024-05-20 ENCOUNTER — Other Ambulatory Visit: Payer: Self-pay | Admitting: Family Medicine

## 2024-05-29 ENCOUNTER — Telehealth (INDEPENDENT_AMBULATORY_CARE_PROVIDER_SITE_OTHER): Payer: Self-pay | Admitting: Otolaryngology

## 2024-05-29 NOTE — Telephone Encounter (Signed)
 Patient called and LVM requesting a call back.  He is experiencing trouble with sleep apnea and had a CPAP delivered yesterday.  The material that came with the machine stated that there can be a problem using the CPAP with and elongated uvula and the patient wanted Dr. Anthony opinion about whether he can start using the CPAP machine or should he wait until his appt on 05/30/2024 to discuss it.  He can be reached at [phone].

## 2024-05-30 ENCOUNTER — Ambulatory Visit (INDEPENDENT_AMBULATORY_CARE_PROVIDER_SITE_OTHER): Admitting: Otolaryngology

## 2024-05-30 ENCOUNTER — Encounter (INDEPENDENT_AMBULATORY_CARE_PROVIDER_SITE_OTHER): Payer: Self-pay | Admitting: Otolaryngology

## 2024-05-30 ENCOUNTER — Encounter: Payer: Self-pay | Admitting: Family Medicine

## 2024-05-30 VITALS — BP 161/75 | HR 73 | Ht 72.0 in | Wt 199.0 lb

## 2024-05-30 DIAGNOSIS — F5101 Primary insomnia: Secondary | ICD-10-CM

## 2024-05-30 DIAGNOSIS — G4733 Obstructive sleep apnea (adult) (pediatric): Secondary | ICD-10-CM | POA: Diagnosis not present

## 2024-05-30 NOTE — Progress Notes (Signed)
 Dear Dr. Katrinka, Here is my assessment for our mutual patient, Richard Mahoney. Thank you for allowing me the opportunity to care for your patient. Please do not hesitate to contact me should you have any other questions. Sincerely, Dr. Eldora Blanch  Otolaryngology Clinic Note Referring provider: Dr. Katrinka HPI:  Initial visit (05/2024): Discussed the use of AI scribe software for clinical note transcription with the patient, who gave verbal consent to proceed.  History of Present Illness Richard Mahoney is an 78 year old male with sleep apnea who presents for evaluation of his condition and management options. He was referred by Dr. Katrinka  Patient is noted to have sleep apnea with prior sleep study, and noted to have non-restorative sleep and dull morning headaches. He has not used the CPAP machine, but has recently gotten it and wonders if there are any surgical options he can try. Of note, he is noted to have a notably long uvula, which has caused issues such as lodging on his tongue during coughing episodes, leading to an emergency room visit. This has occurred multiple times. Other issues include PND and GERD, for which he takes pantoprazole  for GERD but denies burning, belching, or cough.  He is concerned about the practicality of using a CPAP machine during frequent travel, including flying.  Using CPAP: just picked it up day before yesterday Does have insomnia - tried trazodone  and benzodiazepines without benefit. Has not seen sleep/pulm Chest surgery: no Denies dysphonia, dysphagia  ENT Surgery: no Personal or FHx of bleeding dz or anesthesia difficulty: no  GLP-1: no AP/AC: denies current, prior on plavix   Tobacco: no  PMHx: CAD, GERD, HLD, h/o Prostate Cancer, HLD, GERD  Independent Review of Additional Tests or Records:  Dr. Katrinka (04/29/2024): sleep difficulty since May 2025, tried trazodone  and benzo without benefit; sleeping poorly, noted OSA; Nadir 81%; Dx: OSA;  Rx: sleep study, then ref to ENT Dr. Katrinka (05/11/2024): noted OSA, rec CPAP, Zepbound ; Possible referral for inspire, uvula enlarged, ref to ENT [place] CBC and CMP 04/07/2023: WBC 3.9, Hgb 13.4, BUN/Cr 19/1.02 Sleep test interpreted (04/2024): O2 nadir 70%, AHI 35.8 PMH/Meds/All/SocHx/FamHx/ROS:   Past Medical History:  Diagnosis Date   Coronary artery disease    a. ETT 9/13: + ischemic ECG changes;  b. ETT-MV 9/13: EF 62%;  + ischemic EKG changes and + inf ischemia;  c. LHC 9/13: pLAD 30%, mLAD 70-80%, oDx 40-50% ==> FFR hemodynamically significant stenosis in mLAD ==> PCI: Xience Xpedition DES to mLAD; Cath 06/01/12   single vessel CAD w/ patent mid LAD stent and normal LV systolic function   Fibromyalgia    muscle aches in past in both forearms, before statin even   GERD (gastroesophageal reflux disease)    Hyperlipidemia LDL goal < 70    Nephrolithiasis    Testosterone deficiency 04/28/2008   History treatment around 2009. No current treatment.        Past Surgical History:  Procedure Laterality Date   CARDIAC CATHETERIZATION  04/06/2012; 06/01/2012   CATARACT EXTRACTION W/ INTRAOCULAR LENS IMPLANT  08/2011   right .Left 12/2014.    CORONARY ANGIOPLASTY WITH STENT PLACEMENT  04/11/2012   DES LAD   CYSTOSCOPY W/ STONE MANIPULATION  2009   INGUINAL HERNIA REPAIR  1990's   right   KNEE ARTHROSCOPY  1990's   right   LEFT HEART CATHETERIZATION WITH CORONARY ANGIOGRAM N/A 06/01/2012   Procedure: LEFT HEART CATHETERIZATION WITH CORONARY ANGIOGRAM;  Surgeon: Lonni JONETTA Cash, MD;  Location: [facility]  CATH LAB;  Service: Cardiovascular;  Laterality: N/A;   LYMPHADENECTOMY Bilateral 08/18/2014   Procedure: LYMPHADENECTOMY;  Surgeon: Gretel Ferrara, MD;  Location: [facility] ORS;  Service: Urology;  Laterality: Bilateral;   PERCUTANEOUS CORONARY STENT INTERVENTION (PCI-S) N/A 04/11/2012   Procedure: PERCUTANEOUS CORONARY STENT INTERVENTION (PCI-S);  Surgeon: Lonni JONETTA Cash, MD;  Location: [facility] CATH  LAB;  Service: Cardiovascular;  Laterality: N/A;   ROBOT ASSISTED LAPAROSCOPIC RADICAL PROSTATECTOMY N/A 08/18/2014   Procedure: ROBOTIC ASSISTED LAPAROSCOPIC RADICAL PROSTATECTOMY LEVEL 2;  Surgeon: Gretel Ferrara, MD;  Location: [facility] ORS;  Service: Urology;  Laterality: N/A;    Family History  Problem Relation Age of Onset   Diabetes Mother    Heart attack Mother 20   Diabetes Father    Heart attack Father 35   Diabetes Sister    Arthritis Brother      Social Connections: Socially Integrated (04/27/2024)   Social Connection and Isolation Panel    Frequency of Communication with Friends and Family: More than three times a week    Frequency of Social Gatherings with Friends and Family: Not on file    Attends Religious Services: More than 4 times per year    Active Member of [employer] or Organizations: Yes    Attends Engineer, Structural: Not on file    Marital Status: Married      Current Outpatient Medications:    atorvastatin  (LIPITOR) 40 MG tablet, TAKE 1 TABLET BY MOUTH DAILY, Disp: 100 tablet, Rfl: 2   chlorpheniramine (RA CHLORPHENIRAMINE MALEATE) 4 MG tablet, , Disp: , Rfl:    cholecalciferol (VITAMIN D3) 25 MCG (1000 UNIT) tablet, Take 4,000 Units by mouth daily., Disp: , Rfl:    clopidogrel  (PLAVIX ) 75 MG tablet, TAKE 1 TABLET BY MOUTH DAILY, Disp: 100 tablet, Rfl: 0   cyanocobalamin  1000 MCG tablet, Take 1,000 mcg by mouth daily., Disp: , Rfl:    EPINEPHrine  0.3 mg/0.3 mL IJ SOAJ injection, Inject 0.3 mg into the muscle as needed for anaphylaxis., Disp: 2 each, Rfl: 1   nitroGLYCERIN  (NITROSTAT ) 0.4 MG SL tablet, PLACE 1 TABLET UNDER THE TONGUE EVERY 5 MINUTES AS NEEDED FOR CHEST PAIN., Disp: 25 tablet, Rfl: 5   pantoprazole  (PROTONIX ) 40 MG tablet, TAKE 1 TABLET BY MOUTH DAILY, Disp: 100 tablet, Rfl: 2   polyvinyl alcohol  (LIQUIFILM TEARS) 1.4 % ophthalmic solution, Place 1 drop into both eyes daily as needed for dry eyes., Disp: , Rfl:    sodium chloride  (OCEAN) 0.65 %  SOLN nasal spray, Place 1 spray into both nostrils daily as needed for congestion., Disp: , Rfl:    tirzepatide  (ZEPBOUND ) 2.5 MG/0.5ML Pen, Inject 2.5 mg into the skin once a week. Please submit the prior authorization team- see mychart 05/13/24 with details, Disp: 2 mL, Rfl: 5   Physical Exam:   BP (!) 161/75 (BP Location: Right Arm, Patient Position: Sitting, Cuff Size: Large)   Pulse 73   Ht 6' (1.829 m)   Wt 199 lb (90.3 kg)   SpO2 94%   BMI 26.99 kg/m   Salient findings:  CN II-XII intact Anterior rhinoscopy: Septum intact; bilateral inferior turbinates without significant hypertrophy No lesions of oral cavity/oropharynx; Tongue Friedman 3; Uvula is long; tonsils small <1/1 No obviously palpable neck masses/lymphadenopathy/thyromegaly No respiratory distress or stridor; TFL was indicated to better evaluate the proximal airway, given the patient's history and exam findings, and is detailed below. BMI 27   Seprately Identifiable Procedures:  Prior to initiating any procedures, risks/benefits/alternatives were explained  to the patient and verbal consent obtained. Procedure Note Pre-procedure diagnosis:  Obstructive Sleep Apnea Post-procedure diagnosis: Same Procedure: Transnasal Fiberoptic Laryngoscopy, CPT 31575 - Mod 25 Indication: see above Complications: None apparent EBL: 0 mL  The procedure was undertaken to further evaluate the patient's complaint above, with mirror exam inadequate for appropriate examination due to gag reflex and poor patient tolerance  Procedure:  Patient was identified as correct patient. Verbal consent was obtained. The nose was sprayed with oxymetazoline and 4% lidocaine . The The flexible laryngoscope was passed through the nose to view the nasal cavity, pharynx (oropharynx, hypopharynx) and larynx.  The larynx was examined at rest and during multiple phonatory tasks. Documentation was obtained and reviewed with patient. The scope was removed. The  patient tolerated the procedure well.  Findings: The nasal cavity and nasopharynx did not reveal any masses or lesions, mucosa appeared to be without obvious lesions. The tongue base, pharyngeal walls, piriform sinuses, vallecula, epiglottis and postcricoid region are normal in appearance EXCEPT: fairly long uvula, touching tip of epiglottis. Muller maneuver neg; The visualized portion of the subglottis and proximal trachea is widely patent. The vocal folds are mobile bilaterally. There are no lesions on the free edge of the vocal folds nor elsewhere in the larynx worrisome for malignancy.    Electronically signed by: Eldora KATHEE Blanch, MD 06/15/2024 10:06 AM   Impression & Plans:  Antionne Enrique is a 78 y.o. male with   1. OSA (obstructive sleep apnea)   2. Primary insomnia    Severe obstructive sleep apnea with AHI 35. BMI 27. TFL does show long uvula, but given severity of OSA, do not think that doing a UPPP or ESP would necessarily cure his OSA. He has not trialed CPAP, so I would first recommend CPAP trial -- do not think this would cause issues with his long uvula as is his worry.   If unable to tolerate, best surgical option here may be Inspire and we did discuss this for him and what this entails. Complicating factor would be insomnia, so may be best to see sleep med prior to implant to see if we can get a better control of that. But in the interim, if he fails CPAP, we can consider DISE to get process started  Improve sleep hygiene, measures d/w pt  He is in agreement  See below regarding exact medications prescribed this encounter including dosages and route: No orders of the defined types were placed in this encounter.     Thank you for allowing me the opportunity to care for your patient. Please do not hesitate to contact me should you have any other questions.  Sincerely, Eldora Blanch, MD Otolaryngologist (ENT), [facility] Phone: [phone] Fax:  [phone]  06/15/2024, 10:06 AM   MDM:  [phone] Complexity/Problems addressed: mod - multiple chronic problems Data complexity: mod - review of note, labs, review/independent interpretation of testing - Morbidity: low - Prescription Drug prescribed or managed: n

## 2024-06-11 ENCOUNTER — Telehealth: Payer: Self-pay | Admitting: Family Medicine

## 2024-06-11 ENCOUNTER — Other Ambulatory Visit (HOSPITAL_COMMUNITY): Payer: Self-pay

## 2024-06-11 ENCOUNTER — Telehealth: Payer: Self-pay

## 2024-06-11 NOTE — Telephone Encounter (Signed)
 Yes please appeal he has OSA and this is an indicated treatment for sleep apnea

## 2024-06-11 NOTE — Telephone Encounter (Signed)
 Copied from CRM [id number]. Topic: Clinical - Medication Prior Auth >> Jun 11, 2024  4:35 PM Sophia H wrote: Reason for CRM: Spoke with Damien - [facility] regarding tirzepatide  (ZEPBOUND ) 2.5 MG/0.5ML Pen. Damien states the prior authorization was denied but PCP may appeal if he would like.

## 2024-06-11 NOTE — Telephone Encounter (Signed)
 Pharmacy Patient Advocate Encounter   Received notification from Patient Advice Request messages that prior authorization for Zepbound  2.5MG /0.5ML pen-injectors is required/requested.   Insurance verification completed.   The patient is insured through [facility].   Per test claim: PA required; PA submitted to above mentioned insurance via Latent Key/confirmation #/EOC [id number] Status is pending

## 2024-06-11 NOTE — Telephone Encounter (Signed)
 Team is there any update on Zepbound  for this patient? We ordered it last month.   Thanks!!!

## 2024-06-12 ENCOUNTER — Other Ambulatory Visit: Payer: Self-pay | Admitting: Family Medicine

## 2024-06-12 ENCOUNTER — Telehealth: Payer: Self-pay | Admitting: Pharmacist

## 2024-06-12 DIAGNOSIS — G4733 Obstructive sleep apnea (adult) (pediatric): Secondary | ICD-10-CM

## 2024-06-12 NOTE — Telephone Encounter (Signed)
 Please see Provider message below and advise.

## 2024-06-12 NOTE — Telephone Encounter (Signed)
 Please let him know we tried multiple times and Zepbound  was denied-his BMI would have to be more significantly increased above 30

## 2024-06-12 NOTE — Telephone Encounter (Signed)
 Patient aware of denial and no further questions at this time. Discussed with patient this morning.

## 2024-06-12 NOTE — Telephone Encounter (Signed)
 Okay to send

## 2024-06-12 NOTE — Telephone Encounter (Signed)
Denied again

## 2024-06-12 NOTE — Telephone Encounter (Signed)
 Which diagnosis would you like me to attach to the pulmonary referral?

## 2024-06-12 NOTE — Telephone Encounter (Signed)
 Patient aware. Thank you so much.

## 2024-06-12 NOTE — Telephone Encounter (Signed)
 Pharmacy Patient Advocate Encounter  Received notification from [employer] that Prior Authorization for Zepbound  2.5MG /0.5ML pen-injectors  has been DENIED.  Full denial letter will be uploaded to the media tab. See denial reason below.   PA #/Case ID/Reference #: [id number]

## 2024-06-12 NOTE — Telephone Encounter (Signed)
 Referral sent

## 2024-06-12 NOTE — Telephone Encounter (Signed)
 Unfortunately, an appeal is unlikely to be successful, as the patient's BMI does not meet the insurance requirement of >=30 kg/m, even with the OSA diagnosis.  Thank you, Devere Pandy, PharmD Clinical Pharmacist  [place]  Direct Dial: [phone]

## 2024-06-18 ENCOUNTER — Telehealth (INDEPENDENT_AMBULATORY_CARE_PROVIDER_SITE_OTHER): Payer: Self-pay

## 2024-06-18 DIAGNOSIS — Z789 Other specified health status: Secondary | ICD-10-CM

## 2024-06-18 DIAGNOSIS — G4733 Obstructive sleep apnea (adult) (pediatric): Secondary | ICD-10-CM

## 2024-06-18 NOTE — Addendum Note (Signed)
 Addended by: Tyheim Vanalstyne on: 06/18/2024 04:15 PM   Modules accepted: Orders

## 2024-06-18 NOTE — Telephone Encounter (Signed)
 Patient called and left voicemail stating his C-PAP machine is not working for him and he would like to discuss the Inspire per Dr. Tobie and his conversation last appointment.

## 2024-06-18 NOTE — Telephone Encounter (Signed)
 Cannot tolerate CPAP. Will post for DISE Richard Mahoney

## 2024-06-24 ENCOUNTER — Ambulatory Visit: Payer: Medicare Other | Admitting: Family Medicine

## 2024-06-24 ENCOUNTER — Ambulatory Visit: Payer: Self-pay | Admitting: Family Medicine

## 2024-06-24 ENCOUNTER — Encounter: Payer: Self-pay | Admitting: Family Medicine

## 2024-06-24 VITALS — BP 118/72 | HR 71 | Temp 97.9°F | Ht 72.0 in | Wt 188.4 lb

## 2024-06-24 DIAGNOSIS — E559 Vitamin D deficiency, unspecified: Secondary | ICD-10-CM | POA: Diagnosis not present

## 2024-06-24 DIAGNOSIS — Z125 Encounter for screening for malignant neoplasm of prostate: Secondary | ICD-10-CM | POA: Diagnosis not present

## 2024-06-24 DIAGNOSIS — E538 Deficiency of other specified B group vitamins: Secondary | ICD-10-CM

## 2024-06-24 DIAGNOSIS — E785 Hyperlipidemia, unspecified: Secondary | ICD-10-CM | POA: Diagnosis not present

## 2024-06-24 DIAGNOSIS — Z0000 Encounter for general adult medical examination without abnormal findings: Secondary | ICD-10-CM | POA: Diagnosis not present

## 2024-06-24 LAB — LIPID PANEL
Cholesterol: 131 mg/dL (ref 0–200)
HDL: 41.3 mg/dL (ref >39.00)
LDL Cholesterol: 70 mg/dL (ref 0–99)
NonHDL: 89.75
Total CHOL/HDL Ratio: 3
Triglycerides: 98 mg/dL (ref 0.0–149.0)
VLDL: 19.6 mg/dL (ref 0.0–40.0)

## 2024-06-24 LAB — COMPREHENSIVE METABOLIC PANEL WITH GFR
ALT: 20 U/L (ref 0–53)
AST: 24 U/L (ref 0–37)
Albumin: 4.5 g/dL (ref 3.5–5.2)
Alkaline Phosphatase: 68 U/L (ref 39–117)
BUN: 19 mg/dL (ref 6–23)
CO2: 28 meq/L (ref 19–32)
Calcium: 9.1 mg/dL (ref 8.4–10.5)
Chloride: 103 meq/L (ref 96–112)
Creatinine, Ser: 1.03 mg/dL (ref 0.40–1.50)
GFR: 68.46 mL/min (ref >60.00)
Glucose, Bld: 86 mg/dL (ref 70–99)
Potassium: 4.1 meq/L (ref 3.5–5.1)
Sodium: 140 meq/L (ref 135–145)
Total Bilirubin: 1.2 mg/dL (ref 0.2–1.2)
Total Protein: 6.8 g/dL (ref 6.0–8.3)

## 2024-06-24 LAB — VITAMIN B12: Vitamin B-12: >1500 pg/mL — ABNORMAL HIGH (ref 211–911)

## 2024-06-24 LAB — CBC WITH DIFFERENTIAL/PLATELET
Basophils Absolute: 0 K/uL (ref 0.0–0.1)
Basophils Relative: 1 % (ref 0.0–3.0)
Eosinophils Absolute: 0.1 K/uL (ref 0.0–0.7)
Eosinophils Relative: 3 % (ref 0.0–5.0)
HCT: 39.3 % (ref 39.0–52.0)
Hemoglobin: 13.8 g/dL (ref 13.0–17.0)
Lymphocytes Relative: 36.3 % (ref 12.0–46.0)
Lymphs Abs: 1.3 K/uL (ref 0.7–4.0)
MCHC: 35.2 g/dL (ref 30.0–36.0)
MCV: 89.4 fl (ref 78.0–100.0)
Monocytes Absolute: 0.4 K/uL (ref 0.1–1.0)
Monocytes Relative: 11 % (ref 3.0–12.0)
Neutro Abs: 1.8 K/uL (ref 1.4–7.7)
Neutrophils Relative %: 48.7 % (ref 43.0–77.0)
Platelets: 202 K/uL (ref 150.0–400.0)
RBC: 4.39 Mil/uL (ref 4.22–5.81)
RDW: 12.9 % (ref 11.5–15.5)
WBC: 3.7 K/uL — ABNORMAL LOW (ref 4.0–10.5)

## 2024-06-24 LAB — VITAMIN D 25 HYDROXY (VIT D DEFICIENCY, FRACTURES): VITD: 50.72 ng/mL (ref 30.00–100.00)

## 2024-06-24 LAB — PSA, MEDICARE: PSA: 0 ng/mL — ABNORMAL LOW (ref 0.10–4.00)

## 2024-06-24 NOTE — Patient Instructions (Addendum)
 If maintain weight loss- could consider pantoprazole  20 mg- let me know when its a good time to trial  Please stop by lab before you go If you have mychart- we will send your results within 3 business days of us  receiving them.  If you do not have mychart- we will call you about results within 5 business days of us  receiving them.  *please also note that you will see labs on mychart as soon as they post. I will later go in and write notes on them- will say notes from Dr. Katrinka   Recommended follow up: Return in about 1 year (around 06/24/2025) for physical or sooner if needed.Schedule b4 you leave.

## 2024-06-24 NOTE — Progress Notes (Signed)
 Phone: [phone]   Subjective:  Patient presents today for their annual physical. Chief complaint-noted.   See problem oriented charting- ROS- full  review of systems was completed and negative  except for topics noted under acute/chronic concerns  The following were reviewed and entered/updated in epic: Past Medical History:  Diagnosis Date   Coronary artery disease    a. ETT 9/13: + ischemic ECG changes;  b. ETT-MV 9/13: EF 62%;  + ischemic EKG changes and + inf ischemia;  c. LHC 9/13: pLAD 30%, mLAD 70-80%, oDx 40-50% ==> FFR hemodynamically significant stenosis in mLAD ==> PCI: Xience Xpedition DES to mLAD; Cath 06/01/12   single vessel CAD w/ patent mid LAD stent and normal LV systolic function   Fibromyalgia    muscle aches in past in both forearms, before statin even   GERD (gastroesophageal reflux disease)    Hyperlipidemia LDL goal < 70    Nephrolithiasis    Testosterone deficiency 04/28/2008   History treatment around 2009. No current treatment.      Patient Active Problem List   Diagnosis Date Noted   History of prostate cancer 08/18/2014    Priority: High   Coronary atherosclerosis of native coronary artery 04/12/2012    Priority: High   Vitamin D  deficiency 04/04/2022    Priority: Medium    B12 deficiency 10/31/2017    Priority: Medium    GERD (gastroesophageal reflux disease) 03/17/2015    Priority: Medium    Hyperlipidemia 04/25/2012    Priority: Medium    Insomnia 04/22/2010    Priority: Low   NEPHROLITHIASIS, HX OF 03/26/2008    Priority: Low   Past Surgical History:  Procedure Laterality Date   CARDIAC CATHETERIZATION  04/06/2012; 06/01/2012   CATARACT EXTRACTION W/ INTRAOCULAR LENS IMPLANT  08/2011   right .Left 12/2014.    CORONARY ANGIOPLASTY WITH STENT PLACEMENT  04/11/2012   DES LAD   CYSTOSCOPY W/ STONE MANIPULATION  2009   INGUINAL HERNIA REPAIR  1990's   right   KNEE ARTHROSCOPY  1990's   right   LEFT HEART CATHETERIZATION WITH CORONARY  ANGIOGRAM N/A 06/01/2012   Procedure: LEFT HEART CATHETERIZATION WITH CORONARY ANGIOGRAM;  Surgeon: Lonni JONETTA Cash, MD;  Location: [facility] CATH LAB;  Service: Cardiovascular;  Laterality: N/A;   LYMPHADENECTOMY Bilateral 08/18/2014   Procedure: LYMPHADENECTOMY;  Surgeon: Gretel Ferrara, MD;  Location: [facility] ORS;  Service: Urology;  Laterality: Bilateral;   PERCUTANEOUS CORONARY STENT INTERVENTION (PCI-S) N/A 04/11/2012   Procedure: PERCUTANEOUS CORONARY STENT INTERVENTION (PCI-S);  Surgeon: Lonni JONETTA Cash, MD;  Location[facility] CATH LAB;  Service: Cardiovascular;  Laterality: N/A;   ROBOT ASSISTED LAPAROSCOPIC RADICAL PROSTATECTOMY N/A 08/18/2014   Procedure: ROBOTIC ASSISTED LAPAROSCOPIC RADICAL PROSTATECTOMY LEVEL 2;  Surgeon: Gretel Ferrara, MD;  Location: [facility];  Service: Urology;  Laterality: N/A;    Family History  Problem Relation Age of Onset   Diabetes Mother    Heart attack Mother 57   Diabetes Father    Heart attack Father 52   Diabetes Sister    Arthritis Brother     Medications- reviewed and updated Current Outpatient Medications  Medication Sig Dispense Refill   atorvastatin  (LIPITOR) 40 MG tablet TAKE 1 TABLET BY MOUTH DAILY 100 tablet 2   chlorpheniramine (RA CHLORPHENIRAMINE MALEATE) 4 MG tablet      cholecalciferol (VITAMIN D3) 25 MCG (1000 UNIT) tablet Take 4,000 Units by mouth daily.     clopidogrel  (PLAVIX ) 75 MG tablet TAKE 1 TABLET BY MOUTH DAILY 100 tablet  0   cyanocobalamin  1000 MCG tablet Take 1,000 mcg by mouth daily.     EPINEPHrine  0.3 mg/0.3 mL IJ SOAJ injection Inject 0.3 mg into the muscle as needed for anaphylaxis. 2 each 1   nitroGLYCERIN  (NITROSTAT ) 0.4 MG SL tablet PLACE 1 TABLET UNDER THE TONGUE EVERY 5 MINUTES AS NEEDED FOR CHEST PAIN. 25 tablet 5   pantoprazole  (PROTONIX ) 40 MG tablet TAKE 1 TABLET BY MOUTH DAILY 100 tablet 2   polyvinyl alcohol  (LIQUIFILM TEARS) 1.4 % ophthalmic solution Place 1 drop into both eyes daily as needed for dry eyes.      sodium chloride  (OCEAN) 0.65 % SOLN nasal spray Place 1 spray into both nostrils daily as needed for congestion.     tirzepatide  (ZEPBOUND ) 2.5 MG/0.5ML Pen Inject 2.5 mg into the skin once a week. Please submit the prior authorization team- see mychart 05/13/24 with details 2 mL 5   No current facility-administered medications for this visit.    Allergies-reviewed and updated Allergies[1]  Social History   Social History Narrative   Married (brother patient here, wife patient elsewhere). 2 children and 2 grandkids 4 and 2 in 2022 (daughter  in [place] with 2 kids, son in [place]-working with [employer]- administrator, sports).       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 Oropharynx normal Neck: no thyromegaly CV: RRR no murmurs rubs or gallops Lungs: CTAB no crackles, wheeze, rhonchi Abdomen: soft/nontender/nondistended/normal bowel sounds. No rebound or guarding.  Ext: no edema Skin: warm, dry Neuro: grossly normal, moves all extremities, PERRLA   Assessment and Plan  78 y.o. male presenting for annual physical.  Health Maintenance counseling: 1. Anticipatory guidance: Patient counseled regarding regular dental exams -q6 months, eye exams - yearly,  avoiding smoking and second hand smoke , limiting alcohol  to 2 beverages per day - doesn't drink, no illicit drugs .   2. Risk factor reduction:  Advised patient of need for regular exercise and diet rich and fruits and vegetables to reduce risk of heart attack and stroke.  Exercise- 5 days a week 40 minutes on  treadmill - since November 20th doing 7 days a week twice a day 2 miles at  a time.  Diet/weight management-aggressively working on weight loss to see if that would help with sleep issues- upped exercise and improved diet and down to 185 at home.  Wt Readings from Last 3 Encounters:  06/24/24 188 lb 6.4 oz (85.5 kg)  05/30/24 199 lb (90.3 kg)  04/29/24 199 lb 3.2 oz (90.4 kg)  3. Immunizations/screenings/ancillary studies- declines COVID  Immunization History  Administered Date(s) Administered   Fluad Quad(high Dose 65+) 04/04/2019, 04/15/2020, 04/22/2021   INFLUENZA, HIGH DOSE SEASONAL PF 04/26/2013, 04/15/2014, 04/28/2016, 03/27/2018, 04/03/2023, 04/29/2024   Influenza Split 05/30/2011, 05/15/2012   Influenza Whole 03/26/2008, 04/22/2010   Influenza, Quadrivalent, Recombinant, Inj, Pf 04/13/2017   Influenza,inj,Quad PF,6+ Mos 03/17/2015   Influenza-Unspecified 04/13/2017   Moderna Sars-Covid-2 Vaccination 07/31/2019, 08/28/2019, 06/26/2020   Pneumococcal Conjugate-13  04/15/2014   Pneumococcal Polysaccharide-23 04/22/2010   Td 07/12/1995, 03/26/2008   Tdap 10/04/2016   Zoster Recombinant(Shingrix) 03/13/2021, 06/08/2021   Zoster, Live 03/26/2008, 03/13/2021  4. Prostate cancer screening- check levels today  Lab Results  Component Value Date   PSA 0.00 (L) 04/07/2023   PSA 0.00 (L) 04/04/2022   PSA 0.00 Repeated and verified X2. (L) 03/01/2021   5. Colon cancer screening - last 2019 and no blood in stool or melena 6. Skin cancer screening- Dr. Trudy dermatology. advised regular sunscreen use. Denies worrisome, changing, or new skin lesions.  7. Smoking associated screening (lung cancer screening, AAA screen 65-75, UA)- never smoker 8. STD screening - only active with wife  Status of chronic or acute concerns    #PROSTATECTOMY 2016 FOR CANCER- PSA 0 September 2024 and checking today  # Sleep apnea - We initially set him up for CPAP but not tolerating mask well and sleep not  improving. Tried nasal CPAP but mouth breather - Referred to pulmonology - Zepbound  rejected by insurance with BMI too low. He's cutting down on caloric intake and exercising  -request for surgical correction sent through Dr. Tobie - he has been consistently using the mask on a nightly basis for 22 consecutive days average of at least 7 hours. Despite this having significant difficulty tolerating the mask.  -still getting significant # of AHI events despite wearing mask  #Alpha gal positive - prior diarrhea and RUQ pain for 10 months before discovery  -tested by gastroenterology in [place]  -no respiratory issues   or lip or tongue swelling -stopped beef/pork/red meat/gelatin derivatives   -had been treated for H. Pylori previously - appointment October 2nd with Dr. Donneta chirop[place]-95% cure rate- no issues since then   #CAD #Hyperlipidemia-LDL goal under 70 S: Medications: Atorvastatin  40 mg daily- Also compliant with Plavix -not on aspirin .  -has never had to use nitroglycerin .  Follows with Dr. Verlin  -no chest pain or shortness of breath  Lab Results  Component Value Date   CHOL 132 04/07/2023   HDL 48.60 04/07/2023   LDLCALC 56 04/07/2023   LDLDIRECT 86.0 10/16/2015   TRIG 140.0 04/07/2023   CHOLHDL 3 04/07/2023   A/P:  hopefully stable- update lipid panel today. Continue current meds for now . Cardiology upcoming in [place]  #GERD S: Medication: Protonix  40 mg daily- breakthrough on 20mg .  Have discussed Pepcid  in the past but does not like twice daily dosing. A/P: if weight loss persists through next visit consider trying 20 mg again   # B12 deficiency S: Current treatment/medication (oral vs. IM): Oral over-the-counter  Lab Results  Component Value Date   VITAMINB12 894 04/07/2023  A/P: hopefully stable- update b12 today. Continue current meds for now    #Vitamin D  deficiency S: Medication: 1000 units daily Last vitamin D  Lab Results   Component Value Date   VD25OH 39.83 04/07/2023   A/P: hopefully stable- update vitamin D   today. Continue current meds for now     #Polymyalgia rheumatica history-  remained off prednisone  and methotrexate for now 2 years-followed with Dr. Leni previously- available if needed. No recurrence     Recommended follow up: Return in about 1 year (around 06/24/2025) for physical or sooner if needed.Schedule b4 you leave. Future Appointments  Date Time Provider Department Center  07/01/2024  2:20 PM [facility] [place] VISIT 1 [name]  07/25/2024  8:30 AM Neda Jennet LABOR, MD [facility] [id number]  09/25/2024  3:40 PM Verlin Lonni BIRCH, MD [facility]   Lab/Order associations: fasting   ICD-10-CM   1. Preventative health care  Z00.00     2. Screening for prostate cancer  Z12.5     3. Vitamin B12 deficiency  E53.8     4. Vitamin D  deficiency  E55.9     5. Hyperlipidemia, unspecified hyperlipidemia type  E78.5     6. B12 deficiency  E53.8       No orders of the defined types were placed in this encounter.   Return precautions advised.  Garnette Lukes, MD      [1]  Allergies Allergen Reactions   Trazodone  And Nefazodone     Severe constipation and severe dry eyes   Sulfa Antibiotics Rash    rash

## 2024-07-01 ENCOUNTER — Ambulatory Visit: Payer: Medicare Other

## 2024-07-01 VITALS — BP 117/73 | Ht 72.0 in | Wt 188.0 lb

## 2024-07-01 DIAGNOSIS — Z0000 Encounter for general adult medical examination without abnormal findings: Secondary | ICD-10-CM | POA: Diagnosis not present

## 2024-07-01 NOTE — Patient Instructions (Signed)
 [name],  Thank you for taking the time for your Medicare Wellness Visit. I appreciate your continued commitment to your health goals. Please review the care plan we discussed, and feel free to reach out if I can assist you further.  Please note that Annual Wellness Visits do not include a physical exam. Some assessments may be limited, especially if the visit was conducted virtually. If needed, we may recommend an in-person follow-up with your provider.  Ongoing Care Seeing your primary care provider every 3 to 6 months helps us  monitor your health and provide consistent, personalized care.   Referrals If a referral was made during today's visit and you haven't received any updates within two weeks, please contact the referred provider directly to check on the status.  Recommended Screenings:  Health Maintenance  Topic Date Due   Medicare Annual Wellness Visit  06/19/2024   COVID-19 Vaccine (4 - 2025-26 season) 07/10/2024*   DTaP/Tdap/Td vaccine (4 - Td or Tdap) 10/05/2026   Pneumococcal Vaccine for age over 42  Completed   Flu Shot  Completed   Zoster (Shingles) Vaccine  Completed   Meningitis B Vaccine  Aged Out   Colon Cancer Screening  Discontinued  *Topic was postponed. The date shown is not the original due date.       06/20/2023    3:07 PM  Advanced Directives  Does Patient Have a Medical Advance Directive? Yes  Type of Estate Agent of [place];Living will  Does patient want to make changes to medical advance directive? No - Patient declined  Copy of Healthcare Power of Attorney in Chart? Yes - validated most recent copy scanned in chart (See row information)    Vision: Annual vision screenings are recommended for early detection of glaucoma, cataracts, and diabetic retinopathy. These exams can also reveal signs of chronic conditions such as diabetes and high blood pressure.  Dental: Annual dental screenings help detect early signs of oral  cancer, gum disease, and other conditions linked to overall health, including heart disease and diabetes.  Please see the attached documents for additional preventive care recommendations.

## 2024-07-01 NOTE — Progress Notes (Signed)
 "  Chief Complaint  Patient presents with   Medicare Wellness     Subjective:   Richard Mahoney is a 78 y.o. male who presents for a Medicare Annual Wellness Visit.  Visit info / Clinical Intake: Medicare Wellness Visit Type:: Subsequent Annual Wellness Visit Persons participating in visit and providing information:: patient Medicare Wellness Visit Mode:: Telephone If telephone:: video declined Since this visit was completed virtually, some vitals may be partially provided or unavailable. Missing vitals are due to the limitations of the virtual format.: Documented vitals are patient reported If Telephone or Video please confirm:: I connected with patient using audio/video enable telemedicine. I verified patient identity with two identifiers, discussed telehealth limitations, and patient agreed to proceed. Patient Location:: home Provider Location:: home office Interpreter Needed?: No Pre-visit prep was completed: yes AWV questionnaire completed by patient prior to visit?: yes Date:: 06/30/24 Living arrangements:: (Patient-Rptd) lives with spouse/significant other Patient's Overall Health Status Rating: (Patient-Rptd) very good Typical amount of pain: (Patient-Rptd) none Does pain affect daily life?: (Patient-Rptd) no Are you currently prescribed opioids?: no  Dietary Habits and Nutritional Risks How many meals a day?: (Patient-Rptd) 2 Eats fruit and vegetables daily?: (Patient-Rptd) yes Most meals are obtained by: (Patient-Rptd) preparing own meals In the last 2 weeks, have you had any of the following?: none Diabetic:: no  Functional Status Activities of Daily Living (to include ambulation/medication): (Patient-Rptd) Independent Ambulation: Independent with device- listed below Home Assistive Devices/Equipment: CPAP; Eyeglasses Medication Administration: (Patient-Rptd) Independent Home Management (perform basic housework or laundry): (Patient-Rptd) Independent Manage your  own finances?: (Patient-Rptd) yes Primary transportation is: (Patient-Rptd) driving Concerns about vision?: no *vision screening is required for WTM* Concerns about hearing?: no  Fall Screening Falls in the past year?: (Patient-Rptd) 0 Number of falls in past year: 0 Was there an injury with Fall?: 0 Fall Risk Category Calculator: 0 Patient Fall Risk Level: Low Fall Risk  Fall Risk Patient at Risk for Falls Due to: No Fall Risks Fall risk Follow up: Falls evaluation completed  Home and Transportation Safety: All rugs have non-skid backing?: (Patient-Rptd) yes All stairs or steps have railings?: (Patient-Rptd) yes Grab bars in the bathtub or shower?: (Patient-Rptd) yes Have non-skid surface in bathtub or shower?: (Patient-Rptd) yes Good home lighting?: (Patient-Rptd) yes Regular seat belt use?: (Patient-Rptd) yes Hospital stays in the last year:: (Patient-Rptd) no  Cognitive Assessment Difficulty concentrating, remembering, or making decisions? : (Patient-Rptd) no Will 6CIT or Mini Cog be Completed: yes What year is it?: 0 points What month is it?: 0 points Give patient an address phrase to remember (5 components): [address] [place]  About what time is it?: 0 points Count backwards from 20 to 1: 0 points Say the months of the year in reverse: 0 points Repeat the address phrase from earlier: 0 points 6 CIT Score: 0 points  Advance Directives (For Healthcare) Does Patient Have a Medical Advance Directive?: Yes Type of Advance Directive: Healthcare Power of Attorney Copy of Healthcare Power of Attorney in Chart?: No - copy requested (need an updated copy last 2014)  Reviewed/Updated  Reviewed/Updated: Reviewed All (Medical, Surgical, Family, Medications, Allergies, Care Teams, Patient Goals)    Allergies (verified) Trazodone  and nefazodone and Sulfa antibiotics   Current Medications (verified) Outpatient Encounter Medications as of 07/01/2024  Medication Sig    atorvastatin  (LIPITOR) 40 MG tablet TAKE 1 TABLET BY MOUTH DAILY   chlorpheniramine (RA CHLORPHENIRAMINE MALEATE) 4 MG tablet    cholecalciferol (VITAMIN D3) 25 MCG (1000 UNIT)  tablet Take 4,000 Units by mouth daily.   clopidogrel  (PLAVIX ) 75 MG tablet TAKE 1 TABLET BY MOUTH DAILY   cyanocobalamin  1000 MCG tablet Take 1,000 mcg by mouth daily.   EPINEPHrine  0.3 mg/0.3 mL IJ SOAJ injection Inject 0.3 mg into the muscle as needed for anaphylaxis.   nitroGLYCERIN  (NITROSTAT ) 0.4 MG SL tablet PLACE 1 TABLET UNDER THE TONGUE EVERY 5 MINUTES AS NEEDED FOR CHEST PAIN.   pantoprazole  (PROTONIX ) 40 MG tablet TAKE 1 TABLET BY MOUTH DAILY   polyvinyl alcohol  (LIQUIFILM TEARS) 1.4 % ophthalmic solution Place 1 drop into both eyes daily as needed for dry eyes.   sodium chloride  (OCEAN) 0.65 % SOLN nasal spray Place 1 spray into both nostrils daily as needed for congestion.   [DISCONTINUED] tirzepatide  (ZEPBOUND ) 2.5 MG/0.5ML Pen Inject 2.5 mg into the skin once a week. Please submit the prior authorization team- see mychart 05/13/24 with details   No facility-administered encounter medications on file as of 07/01/2024.    History: Past Medical History:  Diagnosis Date   Allergy 1995   Cancer [facility]) Prostate cancer   removal surgery 08/11/2014   Cataract 2010 & 2013   Coronary artery disease    a. ETT 9/13: + ischemic ECG changes;  b. ETT-MV 9/13: EF 62%;  + ischemic EKG changes and + inf ischemia;  c. LHC 9/13: pLAD 30%, mLAD 70-80%, oDx 40-50% ==> FFR hemodynamically significant stenosis in mLAD ==> PCI: Xience Xpedition DES to mLAD; Cath 06/01/12   single vessel CAD w/ patent mid LAD stent and normal LV systolic function   Fibromyalgia    muscle aches in past in both forearms, before statin even   GERD (gastroesophageal reflux disease)    Hyperlipidemia LDL goal < 70    Nephrolithiasis    Testosterone deficiency 04/28/2008   History treatment around 2009. No current treatment.      Past Surgical  History:  Procedure Laterality Date   CARDIAC CATHETERIZATION  04/06/2012; 06/01/2012   CATARACT EXTRACTION W/ INTRAOCULAR LENS IMPLANT  08/12/2011   right .Left 12/2014.    CORONARY ANGIOPLASTY WITH STENT PLACEMENT  04/11/2012   DES LAD   CYSTOSCOPY W/ STONE MANIPULATION  07/12/2007   EYE SURGERY  Cataract surgery   2010 & 2013   INGUINAL HERNIA REPAIR  03/11/1989   right   KNEE ARTHROSCOPY  03/11/1989   right   LEFT HEART CATHETERIZATION WITH CORONARY ANGIOGRAM N/A 06/01/2012   Procedure: LEFT HEART CATHETERIZATION WITH CORONARY ANGIOGRAM;  Surgeon: Lonni JONETTA Cash, MD;  Location: [facility] CATH LAB;  Service: Cardiovascular;  Laterality: N/A;   LYMPHADENECTOMY Bilateral 08/18/2014   Procedure: LYMPHADENECTOMY;  Surgeon: Gretel Ferrara, MD;  Location: [facility] ORS;  Service: Urology;  Laterality: Bilateral;   PERCUTANEOUS CORONARY STENT INTERVENTION (PCI-S) N/A 04/11/2012   Procedure: PERCUTANEOUS CORONARY STENT INTERVENTION (PCI-S);  Surgeon: Lonni JONETTA Cash, MD;  Location[facility] CATH LAB;  Service: Cardiovascular;  Laterality: N/A;   ROBOT ASSISTED LAPAROSCOPIC RADICAL PROSTATECTOMY N/A 08/18/2014   Procedure: ROBOTIC ASSISTED LAPAROSCOPIC RADICAL PROSTATECTOMY LEVEL 2;  Surgeon: Gretel Ferrara, MD;  Location: [facility] ORS;  Service: Urology;  Laterality: N/A;   Family History  Problem Relation Age of Onset   Diabetes Mother    Heart attack Mother 54   Heart disease Mother    Varicose Veins Mother    Diabetes Father    Heart attack Father 54   Heart disease Father    Diabetes Sister    Arthritis Brother    Cancer Brother  Arthritis Brother    Social History   Occupational History   Occupation: Retired  Tobacco Use   Smoking status: Never   Smokeless tobacco: Never  Substance and Sexual Activity   Alcohol  use: Never   Drug use: Never   Sexual activity: Not Currently    Comment: Complete removal of prostate in 2016.   Tobacco Counseling Counseling given: Not  Answered  SDOH Screenings   Food Insecurity: No Food Insecurity (07/01/2024)  Housing: Unknown (07/01/2024)  Transportation Needs: No Transportation Needs (07/01/2024)  Utilities: Not At Risk (07/01/2024)  Depression (PHQ2-9): Low Risk (07/01/2024)  Recent Concern: Depression (PHQ2-9) - Medium Risk (06/24/2024)  Financial Resource Strain: Low Risk (04/27/2024)  Physical Activity: Sufficiently Active (07/01/2024)  Social Connections: Socially Integrated (07/01/2024)  Stress: No Stress Concern Present (07/01/2024)  Tobacco Use: Low Risk (07/01/2024)  Health Literacy: Adequate Health Literacy (07/01/2024)   See flowsheets for full screening details  Depression Screen PHQ 2 & 9 Depression Scale- Over the past 2 weeks, how often have you been bothered by any of the following problems? Little interest or pleasure in doing things: 0 Feeling down, depressed, or hopeless (PHQ Adolescent also includes...irritable): 0 PHQ-2 Total Score: 0 Trouble falling or staying asleep, or sleeping too much: 0 Feeling tired or having little energy: 0 Poor appetite or overeating (PHQ Adolescent also includes...weight loss): 0 Feeling bad about yourself - or that you are a failure or have let yourself or your family down: 0 Trouble concentrating on things, such as reading the newspaper or watching television (PHQ Adolescent also includes...like school work): 0 Moving or speaking so slowly that other people could have noticed. Or the opposite - being so fidgety or restless that you have been moving around a lot more than usual: 0 Thoughts that you would be better off dead, or of hurting yourself in some way: 0 PHQ-9 Total Score: 0 If you checked off any problems, how difficult have these problems made it for you to do your work, take care of things at home, or get along with other people?: Not difficult at all     Goals Addressed               This Visit's Progress     get qualified for inspire  (pt-stated)        Get qualified for inspire              Objective:    Today's Vitals   07/01/24 1409  BP: 117/73  Weight: 188 lb (85.3 kg)  Height: 6' (1.829 m)   Body mass index is 25.5 kg/m.  Hearing/Vision screen Hearing Screening - Comments:: Pt denies any hearing issues  Vision Screening - Comments:: Wears rx glasses - up to date with routine eye exams with Dr Arloa  Immunizations and Health Maintenance Health Maintenance  Topic Date Due   COVID-19 Vaccine (4 - 2025-26 season) 07/10/2024 (Originally 03/11/2024)   Medicare Annual Wellness (AWV)  07/01/2025   DTaP/Tdap/Td (4 - Td or Tdap) 10/05/2026   Pneumococcal Vaccine: 50+ Years  Completed   Influenza Vaccine  Completed   Zoster Vaccines- Shingrix  Completed   Meningococcal B Vaccine  Aged Out   Colonoscopy  Discontinued        Assessment/Plan:  This is a routine wellness examination for Yannis.  Patient Care Team: Katrinka Garnette KIDD, MD as PCP - General (Family Medicine) Verlin Lonni BIRCH, MD as PCP - Cardiology (Cardiology) Renda Glance, MD as Consulting Physician (Urology) Leni,  Marjory MATSU, MD as Consulting Physician (Rheumatology)  I have personally reviewed and noted the following in the patients chart:   Medical and social history Use of alcohol , tobacco or illicit drugs  Current medications and supplements including opioid prescriptions. Functional ability and status Nutritional status Physical activity Advanced directives List of other physicians Hospitalizations, surgeries, and ER visits in previous 12 months Vitals Screenings to include cognitive, depression, and falls Referrals and appointments  No orders of the defined types were placed in this encounter.  In addition, I have reviewed and discussed with patient certain preventive protocols, quality metrics, and best practice recommendations. A written personalized care plan for preventive services as well as general preventive  health recommendations were provided to patient.   Ellouise VEAR Haws, LPN   [id number]/[id number]   Return in about 53 weeks (around 07/07/2025).  After Visit Summary: (MyChart) Due to this being a telephonic visit, the after visit summary with patients personalized plan was offered to patient via MyChart   Nurse Notes: No voiced or noted concerns at this time  "

## 2024-07-10 ENCOUNTER — Other Ambulatory Visit: Payer: Self-pay

## 2024-07-10 ENCOUNTER — Encounter (HOSPITAL_BASED_OUTPATIENT_CLINIC_OR_DEPARTMENT_OTHER): Payer: Self-pay

## 2024-07-10 NOTE — Progress Notes (Signed)
" °   07/10/24 1358  PAT Phone Screen  Is the patient taking a GLP-1 receptor agonist? No  Do You Have Diabetes? No  Do You Have Hypertension? No  Have You Ever Been to the ER for Asthma? No  Have You Taken Oral Steroids in the Past 3 Months? No  Do you Take Phenteramine or any Other Diet Drugs? No  Recent  Lab Work, EKG, CXR? (S)  Yes (EKG 08/2023 annual cards check up)  Where was this test performed? (S)  [facility] (CMP)  Do you have a history of heart problems? Yes  Cardiologist Name MARYLN)  Verlin Lonni BIRCH, MD  Have you ever had tests on your heart? (S)  Yes  What cardiac tests were performed? Echo;EKG  What date/year were cardiac tests completed? (S)  Echo 2024 EF 60-65%, EKG 08/2023  Results viewable: [facility] Media Tab  Any Recent Hospitalizations? No  Height 6' (1.829 m)  Weight 85.2 kg  Pat Appointment Scheduled No  Reason for No Appointment Not Needed   Patient had cardiac cath w/stent placement in 2013. Has been on Plavix  ever since and has maintained a healthy cardiac lifestyle.  "

## 2024-07-12 ENCOUNTER — Encounter: Payer: Self-pay | Admitting: Family Medicine

## 2024-07-17 ENCOUNTER — Encounter (HOSPITAL_BASED_OUTPATIENT_CLINIC_OR_DEPARTMENT_OTHER): Payer: Self-pay

## 2024-07-17 ENCOUNTER — Encounter (HOSPITAL_BASED_OUTPATIENT_CLINIC_OR_DEPARTMENT_OTHER): Admitting: Anesthesiology

## 2024-07-17 ENCOUNTER — Ambulatory Visit (HOSPITAL_BASED_OUTPATIENT_CLINIC_OR_DEPARTMENT_OTHER): Admitting: Anesthesiology

## 2024-07-17 ENCOUNTER — Encounter (HOSPITAL_BASED_OUTPATIENT_CLINIC_OR_DEPARTMENT_OTHER): Admission: RE | Disposition: A | Payer: Self-pay | Source: Home / Self Care | Attending: Otolaryngology

## 2024-07-17 ENCOUNTER — Telehealth (INDEPENDENT_AMBULATORY_CARE_PROVIDER_SITE_OTHER): Payer: Self-pay | Admitting: Otolaryngology

## 2024-07-17 ENCOUNTER — Ambulatory Visit (HOSPITAL_BASED_OUTPATIENT_CLINIC_OR_DEPARTMENT_OTHER)
Admission: RE | Admit: 2024-07-17 | Discharge: 2024-07-17 | Disposition: A | Discharge status: Home / Self Care | Attending: Otolaryngology | Admitting: Otolaryngology

## 2024-07-17 ENCOUNTER — Other Ambulatory Visit: Payer: Self-pay

## 2024-07-17 DIAGNOSIS — I2510 Atherosclerotic heart disease of native coronary artery without angina pectoris: Secondary | ICD-10-CM | POA: Diagnosis not present

## 2024-07-17 DIAGNOSIS — G4733 Obstructive sleep apnea (adult) (pediatric): Secondary | ICD-10-CM

## 2024-07-17 DIAGNOSIS — Z7902 Long term (current) use of antithrombotics/antiplatelets: Secondary | ICD-10-CM | POA: Insufficient documentation

## 2024-07-17 DIAGNOSIS — Z789 Other specified health status: Secondary | ICD-10-CM

## 2024-07-17 DIAGNOSIS — M797 Fibromyalgia: Secondary | ICD-10-CM | POA: Diagnosis not present

## 2024-07-17 DIAGNOSIS — E785 Hyperlipidemia, unspecified: Secondary | ICD-10-CM | POA: Diagnosis not present

## 2024-07-17 DIAGNOSIS — Z955 Presence of coronary angioplasty implant and graft: Secondary | ICD-10-CM | POA: Insufficient documentation

## 2024-07-17 DIAGNOSIS — K219 Gastro-esophageal reflux disease without esophagitis: Secondary | ICD-10-CM | POA: Insufficient documentation

## 2024-07-17 HISTORY — PX: DRUG INDUCED ENDOSCOPY: SHX6808

## 2024-07-17 MED ORDER — ACETAMINOPHEN 500 MG PO TABS
1000.0000 mg | ORAL_TABLET | Freq: Once | ORAL | Status: AC
  Administered 2024-07-17: 1000 mg via ORAL

## 2024-07-17 MED ORDER — ONDANSETRON HCL 4 MG/2ML IJ SOLN: 4.0000 mg | Freq: Once | INTRAMUSCULAR | Status: DC | PRN

## 2024-07-17 MED ORDER — ACETAMINOPHEN 500 MG PO TABS
ORAL_TABLET | ORAL | Status: AC
  Filled 2024-07-17: qty 2

## 2024-07-17 MED ORDER — PROPOFOL 500 MG/50ML IV EMUL
INTRAVENOUS | Status: DC | PRN
  Administered 2024-07-17: 100 ug/kg/min via INTRAVENOUS

## 2024-07-17 MED ORDER — LACTATED RINGERS IV SOLN: INTRAVENOUS | Status: DC

## 2024-07-17 MED ORDER — AMISULPRIDE (ANTIEMETIC) 5 MG/2ML IV SOLN: 10.0000 mg | Freq: Once | INTRAVENOUS | Status: DC | PRN

## 2024-07-17 MED ORDER — PROPOFOL 10 MG/ML IV BOLUS
INTRAVENOUS | Status: DC | PRN
  Administered 2024-07-17 (×2): 20 mg via INTRAVENOUS

## 2024-07-17 MED ORDER — FENTANYL CITRATE (PF) 100 MCG/2ML IJ SOLN: 25.0000 ug | INTRAMUSCULAR | Status: DC | PRN

## 2024-07-17 MED ORDER — OXYMETAZOLINE HCL 0.05 % NA SOLN
NASAL | Status: DC | PRN
  Administered 2024-07-17: 1 via TOPICAL

## 2024-07-17 NOTE — Telephone Encounter (Signed)
 Passed DISE; posted for inspire

## 2024-07-17 NOTE — Anesthesia Postprocedure Evaluation (Signed)
"   Anesthesia Post Note  Patient: Richard Mahoney  Procedure(s) Performed: DRUG INDUCED SLEEP ENDOSCOPY (Throat)     Patient location during evaluation: PACU Anesthesia Type: MAC Level of consciousness: awake Pain management: pain level controlled Vital Signs Assessment: post-procedure vital signs reviewed and stable Respiratory status: spontaneous breathing, nonlabored ventilation and respiratory function stable Cardiovascular status: blood pressure returned to baseline and stable Postop Assessment: no apparent nausea or vomiting Anesthetic complications: no   No notable events documented.  Last Vitals:  Vitals:   07/17/24 1301 07/17/24 1328  BP: 128/81 (!) 155/78  Pulse: 74 68  Resp:  16  Temp: 36.7 C 36.9 C  SpO2: 97% 98%    Last Pain:  Vitals:   07/17/24 1328  TempSrc: Temporal  PainSc: 0-No pain                 [name] P Laportia Carley      "

## 2024-07-17 NOTE — H&P (Signed)
 Pre-Operative H&P - Day Of Surgery Patient Name: Richard Mahoney Date:   07/17/2024  HPI: Richard Mahoney is a 79 y.o. male who presents today for operative treatment of obstructive sleep apnea, intolerance of continuous positive airway pressure ventilation. Patient denies recent significant changes to health or significant new medications or physiologic change in condition which would immediately impact plans. No new types of therapy has been initiated that would change the plan or the appropriateness of the plan.  Tried 3 different masks with CPAP for ~8 weeks without improvement -- poor mask fit, takes it off and is very uncomfortable  ROS:  A complete review of systems was obtained and is otherwise negative.   PMH:  Past Medical History:  Diagnosis Date   Allergy 1995   Cancer [facility]) Prostate cancer   removal surgery 08/11/2014   Cataract 2010 & 2013   Coronary artery disease    a. ETT 9/13: + ischemic ECG changes;  b. ETT-MV 9/13: EF 62%;  + ischemic EKG changes and + inf ischemia;  c. LHC 9/13: pLAD 30%, mLAD 70-80%, oDx 40-50% ==> FFR hemodynamically significant stenosis in mLAD ==> PCI: Xience Xpedition DES to mLAD; Cath 06/01/12   single vessel CAD w/ patent mid LAD stent and normal LV systolic function   Fibromyalgia    muscle aches in past in both forearms, before statin even   GERD (gastroesophageal reflux disease)    Hyperlipidemia LDL goal < 70    Nephrolithiasis    Testosterone deficiency 04/28/2008   History treatment around 2009. No current treatment.       PSH:  Past Surgical History:  Procedure Laterality Date   CARDIAC CATHETERIZATION  04/06/2012; 06/01/2012   CATARACT EXTRACTION W/ INTRAOCULAR LENS IMPLANT  08/12/2011   right .Left 12/2014.    CORONARY ANGIOPLASTY WITH STENT PLACEMENT  04/11/2012   DES LAD   CYSTOSCOPY W/ STONE MANIPULATION  07/12/2007   EYE SURGERY  Cataract surgery   2010 & 2013   INGUINAL HERNIA REPAIR  03/11/1989   right   KNEE ARTHROSCOPY   03/11/1989   right   LEFT HEART CATHETERIZATION WITH CORONARY ANGIOGRAM N/A 06/01/2012   Procedure: LEFT HEART CATHETERIZATION WITH CORONARY ANGIOGRAM;  Surgeon: Lonni JONETTA Cash, MD;  Location: [facility] CATH LAB;  Service: Cardiovascular;  Laterality: N/A;   LYMPHADENECTOMY Bilateral 08/18/2014   Procedure: LYMPHADENECTOMY;  Surgeon: Gretel Ferrara, MD;  Location: [facility] ORS;  Service: Urology;  Laterality: Bilateral;   PERCUTANEOUS CORONARY STENT INTERVENTION (PCI-S) N/A 04/11/2012   Procedure: PERCUTANEOUS CORONARY STENT INTERVENTION (PCI-S);  Surgeon: Lonni JONETTA Cash, MD;  Location: [facility] CATH LAB;  Service: Cardiovascular;  Laterality: N/A;   ROBOT ASSISTED LAPAROSCOPIC RADICAL PROSTATECTOMY N/A 08/18/2014   Procedure: ROBOTIC ASSISTED LAPAROSCOPIC RADICAL PROSTATECTOMY LEVEL 2;  Surgeon: Gretel Ferrara, MD;  Location: [facility] ORS;  Service: Urology;  Laterality: N/A;    MEDS:  Current Medications[1]  ALLERGIES: Trazodone  and nefazodone and Sulfa antibiotics  EXAM: Vitals: BP 132/84   Pulse 73   Temp 98.3 F (36.8 C) (Temporal)   Resp 16   Ht 6' (1.829 m)   Wt 89.4 kg   SpO2 97%   BMI 26.73 kg/m   General Awake, at baseline alertness.   HEENT No scleral icterus or conjunctival hemorrhage. Globe position appears normal. External ears  normal. Nose patent without rhinorrhea. No lymphadenopathy. No thyromegaly  Cardiovascular No cyanosis.  Pulmonary No audible stridor. Breathing easily with no labor.  Neuro Symmetric facial movement.   Psychiatry Appropriate affect and  mood.  Skin No scars or lesions on face or neck.  Extermities Moves all extremities with normal range of motion.   Other Findings None.   Assessment & Plan: Richard Mahoney has diagnoses of obstructive sleep apnea, intolerance of continuous positive airway pressure ventilation and will go to the OR today for drug induced sleep endoscopy. Informed consent was obtained and available in EMR today. All questions have been  answered, and risks/benefits/alternatives of procedure as noted in the consent were discussed in a quiet area. Questions were invited and answered. The patient expressed understanding, provided consent and wished to proceed despite risks.  Richard Mahoney 07/17/2024 12:28 PM     [1]  Current Facility-Administered Medications:    lactated ringers  infusion, , Intravenous, Continuous, Maryclare Cornet, MD

## 2024-07-17 NOTE — Transfer of Care (Signed)
 Immediate Anesthesia Transfer of Care Note  Patient: Richard Mahoney  Procedure(s) Performed: DRUG INDUCED SLEEP ENDOSCOPY (Throat)  Patient Location: PACU  Anesthesia Type:MAC  Level of Consciousness: awake, alert , and oriented  Airway & Oxygen Therapy: Patient Spontanous Breathing  Post-op Assessment: Report given to RN and Post -op Vital signs reviewed and stable  Post vital signs: Reviewed and stable  Last Vitals:  Vitals Value Taken Time  BP    Temp    Pulse    Resp    SpO2      Last Pain:  Vitals:   07/17/24 1117  TempSrc: Temporal  PainSc: 0-No pain      Patients Stated Pain Goal: 3 (07/17/24 1117)  Complications: No notable events documented.

## 2024-07-17 NOTE — Op Note (Signed)
 Otolaryngology Operative note  Richard Mahoney Date/Time of Admission: 07/17/2024 10:56 AM  CSN: [id number];MRN:[id number]  DOB: 08-27-1945 Age: 79 y.o. Location: [facility]    Pre-Op Diagnosis: SEVERE OBSTRUCTIVE SLEEP APNEA INTOLERANCE OF CONTINUOUS POSITIVE AIRWAY PRESSURE VENTILATION   Post-Op Diagnosis: Same   Procedure: Procedure(s): DRUG INDUCED SLEEP ENDOSCOPY USING ENID BREWSTER- CPT [id number]  Surgeon: Eldora Blanch, MD  Anesthesia type:  MAC  Anesthesiologist: Anesthesiologist: Patrisha Bernardino SQUIBB, MD CRNA: Julieanne Fairy BROCKS, CRNA   Staff: Circulator: Jorja Arland HERO, RN Scrub Person: Alto Charmaine PARAS  Implants: * No implants in log *  Specimens: * No specimens in log *  EBL: minimal  Drains: None  Post-op disposition and condition: PACU, hemodynamically stable  Findings: There was no evidence of complete concentric palatal obstruction and patient is a candidate anatomically for hypoglossal nerve stimulation therapy. Does have fairly long uvula    Complications: None apparent  Indications and consent:  [name] is a 79 y.o. male with severe obstructive sleep apnea with intolerance of continuous positive airway pressure. As such, with a BMI of 27, patient's options were discussed including Hypoglossal nerve stimulator placement. Risks/benefits/alternatives for each option were discussed. Patient expressed understanding, and despite these risks, consented and decided to proceed with drug induced sleep endoscopy to determine stimulator placement candidacy. Informed consent was signed before proceeding.  Of note, he has trialed CPAP for 2 months with multiple mask trials but is not able to tolerate it  Procedure: The patient was brought to the endoscopy room and was anesthetized via the standard drug-induced sleep endoscopy protocol using propofol  pump. The room lights were dimmed. The propofol  infusion rate was started at 50 mcg  and gradually increased at which point, conditions that mimic sleep were gradually observed.   With the patient not responsive to verbal commands, but still with spontaneous respiration, sleep disordered breathing events were clearly observed including snoring   Under these conditions, the flexible endoscope was inserted to examine both sides of the nose as well as the pharynx and larynx.   Palatal collapse under these conditions was predominantly AP, with trace lateral collapse. With simulated jaw thrust, the hypopharyngeal obstruction and secondarily the palatal collapse improved.   In summary, there was no current evidence of complete concentric palatal obstruction and patient is a candidate anatomically for hypoglossal nerve stimulation therapy.   The anesthesia was then weaned and care transferred to anesthesia who transported patient to PACU in stable condition.   I was present for and performed the entire procedure.

## 2024-07-17 NOTE — Anesthesia Preprocedure Evaluation (Addendum)
"                                    Anesthesia Evaluation  Patient identified by MRN, date of birth, ID band Patient awake    Reviewed: Allergy & Precautions, NPO status , Patient's Chart, lab work & pertinent test results  Airway Mallampati: III  TM Distance: >3 FB Neck ROM: Full    Dental no notable dental hx.    Pulmonary sleep apnea    Pulmonary exam normal        Cardiovascular + CAD and + Cardiac Stents (x 1)  Normal cardiovascular exam     Neuro/Psych  Neuromuscular disease    GI/Hepatic Neg liver ROS,GERD  Medicated and Controlled,,  Endo/Other  negative endocrine ROS    Renal/GU Renal disease     Musculoskeletal negative musculoskeletal ROS (+)    Abdominal   Peds  Hematology  (+) Blood dyscrasia (Plavix )   Anesthesia Other Findings OSA obstructive sleep apnea  Reproductive/Obstetrics                              Anesthesia Physical Anesthesia Plan  ASA: 3  Anesthesia Plan: MAC   Post-op Pain Management:    Induction:   PONV Risk Score and Plan: 1 and Propofol  infusion and Treatment may vary due to age or medical condition  Airway Management Planned:   Additional Equipment:   Intra-op Plan:   Post-operative Plan:   Informed Consent: I have reviewed the patients History and Physical, chart, labs and discussed the procedure including the risks, benefits and alternatives for the proposed anesthesia with the patient or authorized representative who has indicated his/her understanding and acceptance.     Dental advisory given  Plan Discussed with: CRNA  Anesthesia Plan Comments:          Anesthesia Quick Evaluation  "

## 2024-07-17 NOTE — Discharge Instructions (Addendum)
 No tylenol  before 5:30p.   Post Anesthesia Home Care Instructions  Activity: Get plenty of rest for the remainder of the day. A responsible individual must stay with you for 24 hours following the procedure.  For the next 24 hours, DO NOT: -Drive a car -Advertising copywriter -Drink alcoholic beverages -Take any medication unless instructed by your physician -Make any legal decisions or sign important papers.  Meals: Start with liquid foods such as gelatin or soup. Progress to regular foods as tolerated. Avoid greasy, spicy, heavy foods. If nausea and/or vomiting occur, drink only clear liquids until the nausea and/or vomiting subsides. Call your physician if vomiting continues.  Special Instructions/Symptoms: Your throat may feel dry or sore from the anesthesia or the breathing tube placed in your throat during surgery. If this causes discomfort, gargle with warm salt water . The discomfort should disappear within 24 hours.  If you had a scopolamine patch placed behind your ear for the management of post- operative nausea and/or vomiting:  1. The medication in the patch is effective for 72 hours, after which it should be removed.  Wrap patch in a tissue and discard in the trash. Wash hands thoroughly with soap and water . 2. You may remove the patch earlier than 72 hours if you experience unpleasant side effects which may include dry mouth, dizziness or visual disturbances. 3. Avoid touching the patch. Wash your hands with soap and water  after contact with the patch.

## 2024-07-18 ENCOUNTER — Encounter (HOSPITAL_BASED_OUTPATIENT_CLINIC_OR_DEPARTMENT_OTHER): Payer: Self-pay | Admitting: Otolaryngology

## 2024-07-19 ENCOUNTER — Telehealth (INDEPENDENT_AMBULATORY_CARE_PROVIDER_SITE_OTHER): Payer: Self-pay

## 2024-07-19 ENCOUNTER — Encounter: Payer: Self-pay | Admitting: Family Medicine

## 2024-07-19 ENCOUNTER — Telehealth: Payer: Self-pay

## 2024-07-19 ENCOUNTER — Telehealth (HOSPITAL_BASED_OUTPATIENT_CLINIC_OR_DEPARTMENT_OTHER): Payer: Self-pay | Admitting: *Deleted

## 2024-07-19 NOTE — Telephone Encounter (Signed)
 Called patient to schedule an televisit appointment for a pre-op clearance on 07/24/24 @ 1:20. Meds, Rec, and Consent done.        Patient Consent for Virtual Visit        Richard Mahoney has provided verbal consent on 07/19/2024 for a virtual visit (video or telephone).   CONSENT FOR VIRTUAL VISIT FOR:  Richard Mahoney  By participating in this virtual visit I agree to the following:  I hereby voluntarily request, consent and authorize [facility] and its employed or contracted physicians, physician assistants, nurse practitioners or other licensed health care professionals (the Practitioner), to provide me with telemedicine health care services (the Services) as deemed necessary by the treating Practitioner. I acknowledge and consent to receive the Services by the Practitioner via telemedicine. I understand that the telemedicine visit will involve communicating with the Practitioner through live audiovisual communication technology and the disclosure of certain medical information by electronic transmission. I acknowledge that I have been given the opportunity to request an in-person assessment or other available alternative prior to the telemedicine visit and am voluntarily participating in the telemedicine visit.  I understand that I have the right to withhold or withdraw my consent to the use of telemedicine in the course of my care at any time, without affecting my right to future care or treatment, and that the Practitioner or I may terminate the telemedicine visit at any time. I understand that I have the right to inspect all information obtained and/or recorded in the course of the telemedicine visit and may receive copies of available information for a reasonable fee.  I understand that some of the potential risks of receiving the Services via telemedicine include:  Delay or interruption in medical evaluation due to technological equipment failure or disruption; Information  transmitted may not be sufficient (e.g. poor resolution of images) to allow for appropriate medical decision making by the Practitioner; and/or  In rare instances, security protocols could fail, causing a breach of personal health information.  Furthermore, I acknowledge that it is my responsibility to provide information about my medical history, conditions and care that is complete and accurate to the best of my ability. I acknowledge that Practitioner's advice, recommendations, and/or decision may be based on factors not within their control, such as incomplete or inaccurate data provided by me or distortions of diagnostic images or specimens that may result from electronic transmissions. I understand that the practice of medicine is not an exact science and that Practitioner makes no warranties or guarantees regarding treatment outcomes. I acknowledge that a copy of this consent can be made available to me via my patient portal [facility] MyChart), or I can request a printed copy by calling the office of [facility].    I understand that my insurance will be billed for this visit.   I have read or had this consent read to me. I understand the contents of this consent, which adequately explains the benefits and risks of the Services being provided via telemedicine.  I have been provided ample opportunity to ask questions regarding this consent and the Services and have had my questions answered to my satisfaction. I give my informed consent for the services to be provided through the use of telemedicine in my medical care

## 2024-07-19 NOTE — Telephone Encounter (Signed)
 Called patient to schedule an televisit appointment for a pre-op clearance on 07/24/24 @ 1:20. Meds, Rec, and Consent done.

## 2024-07-19 NOTE — Telephone Encounter (Signed)
 I called the office to confirm pt as the spelling of last name on the form was different.      Pre-operative Risk Assessment    Patient Name: Richard Mahoney  DOB: May 12, 1946 MRN: [id number]   Date of last office visit: 08/29/23 DR. MCALHANY Date of next office visit: 09/25/24 DR. MCALHANY   Request for Surgical Clearance    Procedure:  RIGHT HYPOGLOSSAL NERVE STIMULATOR PLACEMENT  Date of Surgery:  Clearance TBD                                Surgeon:  DR. PATEL Surgeon's Group or Practice Name:  [facility] Phone number:  [phone] Fax number:  [phone]   Type of Clearance Requested:   - Medical  - Pharmacy:  Hold Clopidogrel  (Plavix ) x 5 DAYS PRIOR AND x 5 DAYS POST OP   Type of Anesthesia:  General    Additional requests/questions:    Bonney Niels Jest   07/19/2024, 9:29 AM

## 2024-07-19 NOTE — Telephone Encounter (Signed)
" ° °  Name: Richard Mahoney  DOB: 09/18/1945  MRN: [id number]  Primary Cardiologist: Lonni Cash, MD   Preoperative team, please contact this patient and set up a phone call appointment for further preoperative risk assessment. Please obtain consent and complete medication review. Thank you for your help.  I confirm that guidance regarding antiplatelet and oral anticoagulation therapy has been completed and, if necessary, noted below.  Patient was previously cleared to hold Plavix  in May 2025 in the previous virtual visit  I also confirmed the patient resides in the state of [place] . As per [facility] Medical Board telemedicine laws, the patient must reside in the state in which the provider is licensed.   However if the surgery is not urgent, patient has upcoming visit with Dr. Cash in March and can have cardiac clearance at that time.  Jatia Musa, [place] 07/19/2024, 10:12 AM [facility]    "

## 2024-07-19 NOTE — Telephone Encounter (Signed)
 Surgical Clearance faxed .

## 2024-07-19 NOTE — Telephone Encounter (Signed)
-----   Message from Eldora Blanch, MD sent at 07/17/2024  1:02 PM EST ----- Regarding: inspire 5 posted Hi all, Jsut posted this pt for inspire 5.  Probably needs to be done [facility]. He has heart history. Nef can you contact Dr. Verlin ([facility] cardiologist at [facility]) -- needs to hold plavix  5 days before surgery, resume 5 days after

## 2024-07-20 ENCOUNTER — Other Ambulatory Visit: Payer: Self-pay | Admitting: Family Medicine

## 2024-07-24 ENCOUNTER — Ambulatory Visit

## 2024-07-24 ENCOUNTER — Encounter: Payer: Self-pay | Admitting: Cardiovascular Disease

## 2024-07-24 NOTE — Progress Notes (Deleted)
 "   Virtual Visit via Telephone Note   Because of [name] co-morbid illnesses, he is at least at moderate risk for complications without adequate follow up.  This format is felt to be most appropriate for this patient at this time.  Due to technical limitations with video connection (technology), today's appointment will be conducted as an audio only telehealth visit, and [name] verbally agreed to proceed in this manner.   All issues noted in this document were discussed and addressed.  No physical exam could be performed with this format.  Evaluation Performed:  Preoperative cardiovascular risk assessment _____________   Date:  07/24/2024   Patient ID:  Richard Mahoney, DOB 1946-05-28, MRN [id number] Patient Location:  Home Provider location:   Office  Primary Care Provider:  Katrinka Garnette KIDD, MD Primary Cardiologist:  Lonni Cash, MD  Chief Complaint / Patient Profile   79 y.o. y/o male with a h/o CAD s/p DES to LAD 03/2012, GERD, kidney stones who is pending right hypoglossal nerve stimulator placement date TBD (Dr. Tobie) ***  and presents today for telephonic preoperative cardiovascular risk assessment.  History of Present Illness    Richard Mahoney is a 79 y.o. male who presents via audio/video conferencing for a telehealth visit today.  Pt was last seen in cardiology clinic on 08/29/2023 by Dr. Cash.  At that time [name] was doing well.  The patient is now pending procedure as outlined above. Since his last visit, he *** Denies chest pain, shortness of breath, orthopnea, PND, lower extremity edema, palpitations, lightheadedness, dizziness, syncope. ***   Past Medical History    Past Medical History:  Diagnosis Date   Allergy 1995   Cancer [facility]) Prostate cancer   removal surgery 08/11/2014   Cataract 2010 & 2013   Coronary artery disease    a. ETT 9/13: + ischemic ECG changes;  b. ETT-MV 9/13: EF 62%;  + ischemic EKG changes and + inf  ischemia;  c. LHC 9/13: pLAD 30%, mLAD 70-80%, oDx 40-50% ==> FFR hemodynamically significant stenosis in mLAD ==> PCI: Xience Xpedition DES to mLAD; Cath 06/01/12   single vessel CAD w/ patent mid LAD stent and normal LV systolic function   Fibromyalgia    muscle aches in past in both forearms, before statin even   GERD (gastroesophageal reflux disease)    Hyperlipidemia LDL goal < 70    Nephrolithiasis    Testosterone deficiency 04/28/2008   History treatment around 2009. No current treatment.      Past Surgical History:  Procedure Laterality Date   CARDIAC CATHETERIZATION  04/06/2012; 06/01/2012   CATARACT EXTRACTION W/ INTRAOCULAR LENS IMPLANT  08/12/2011   right .Left 12/2014.    CORONARY ANGIOPLASTY WITH STENT PLACEMENT  04/11/2012   DES LAD   CYSTOSCOPY W/ STONE MANIPULATION  07/12/2007   DRUG INDUCED ENDOSCOPY N/A 07/17/2024   Procedure: DRUG INDUCED SLEEP ENDOSCOPY;  Surgeon: Tobie Eldora NOVAK, MD;  Location: [facility];  Service: ENT;  Laterality: N/A;  DISE   EYE SURGERY  Cataract surgery   2010 & 2013   INGUINAL HERNIA REPAIR  03/11/1989   right   KNEE ARTHROSCOPY  03/11/1989   right   LEFT HEART CATHETERIZATION WITH CORONARY ANGIOGRAM N/A 06/01/2012   Procedure: LEFT HEART CATHETERIZATION WITH CORONARY ANGIOGRAM;  Surgeon: Lonni JONETTA Cash, MD;  Location: [facility] CATH LAB;  Service: Cardiovascular;  Laterality: N/A;   LYMPHADENECTOMY Bilateral 08/18/2014   Procedure: LYMPHADENECTOMY;  Surgeon:  Gretel Ferrara, MD;  Location: [facility];  Service: Urology;  Laterality: Bilateral;   PERCUTANEOUS CORONARY STENT INTERVENTION (PCI-S) N/A 04/11/2012   Procedure: PERCUTANEOUS CORONARY STENT INTERVENTION (PCI-S);  Surgeon: Lonni JONETTA Cash, MD;  Location[facility] C[facility];  Service: Cardiovascular;  Laterality: N/A;   ROBOT ASSISTED LAPAROSCOPIC RADICAL PROSTATECTOMY N/A 08/18/2014   Procedure: ROBOTIC ASSISTED LAPAROSCOPIC RADICAL PROSTATECTOMY LEVEL 2;  Surgeon: Gretel Ferrara, MD;  Location: [facility];  Service: Urology;  Laterality: N/A;    Allergies  Allergies[1]  Home Medications    Prior to Admission medications  Medication Sig Start Date End Date Taking? Authorizing Provider  atorvastatin  (LIPITOR) 40 MG tablet TAKE 1 TABLET BY MOUTH DAILY 07/22/24   Katrinka Garnette KIDD, MD  chlorpheniramine (RA CHLORPHENIRAMINE MALEATE) 4 MG tablet     [provider]  cholecalciferol (VITAMIN D3) 25 MCG (1000 UNIT) tablet Take 4,000 Units by mouth daily.    [provider]  clopidogrel  (PLAVIX ) 75 MG tablet TAKE 1 TABLET BY MOUTH DAILY 05/21/24   Cash Lonni JONETTA, MD  cyanocobalamin  1000 MCG tablet Take 1,000 mcg by mouth daily.    [provider]  EPINEPHrine  0.3 mg/0.3 mL IJ SOAJ injection Inject 0.3 mg into the muscle as needed for anaphylaxis. 01/31/23   Katrinka Garnette KIDD, MD  nitroGLYCERIN  (NITROSTAT ) 0.4 MG SL tablet PLACE 1 TABLET UNDER THE TONGUE EVERY 5 MINUTES AS NEEDED FOR CHEST PAIN. 02/17/22   Cash Lonni JONETTA, MD  pantoprazole  (PROTONIX ) 40 MG tablet TAKE 1 TABLET BY MOUTH DAILY 05/21/24   Katrinka Garnette KIDD, MD  polyvinyl alcohol  (LIQUIFILM TEARS) 1.4 % ophthalmic solution Place 1 drop into both eyes daily as needed for dry eyes.    [provider]  sodium chloride  (OCEAN) 0.65 % SOLN nasal spray Place 1 spray into both nostrils daily as needed for congestion.    [provider]    Physical Exam    Vital Signs:  LANNIS LICHTENWALNER does not have vital signs available for review today.  Given telephonic nature of communication, physical exam is limited. AAOx3. NAD. Normal affect.  Speech and respirations are unlabored.  Accessory Clinical Findings    None  Assessment & Plan    1.  Preoperative Cardiovascular Risk Assessment: According to the Revised Cardiac Risk Index (RCRI), his    His   according to the [facility] Activity Status Index (DASI).  The patient was advised that if he develops new  symptoms prior to surgery to contact our office to arrange for a follow-up visit, and he verbalized understanding.  Per office protocol, patient may hold Plavix  for 5 days prior to procedure (patient was previously cleared by MD to hold Plavix ). Please resume Plavix  as soon as possible postprocedure, the discretion of the surgeon.   A copy of this note will be routed to requesting surgeon.  Time:   Today, I have spent *** minutes with the patient with telehealth technology discussing medical history, symptoms, and management plan.     Saddie GORMAN Cleaves, NP  07/24/2024, 8:57 AM     [1]  Allergies Allergen Reactions   Trazodone  And Nefazodone     Severe constipation and severe dry eyes   Sulfa Antibiotics Rash    rash   "

## 2024-07-24 NOTE — Telephone Encounter (Signed)
 Addressed in other encounter.

## 2024-07-25 ENCOUNTER — Encounter: Payer: Self-pay | Admitting: Pulmonary Disease

## 2024-07-25 ENCOUNTER — Telehealth: Payer: Self-pay | Admitting: Pulmonary Disease

## 2024-07-25 ENCOUNTER — Ambulatory Visit: Admitting: Pulmonary Disease

## 2024-07-25 VITALS — BP 132/82 | HR 75 | Temp 97.3°F | Ht 72.5 in | Wt 196.8 lb

## 2024-07-25 DIAGNOSIS — G4700 Insomnia, unspecified: Secondary | ICD-10-CM

## 2024-07-25 DIAGNOSIS — G4733 Obstructive sleep apnea (adult) (pediatric): Secondary | ICD-10-CM

## 2024-07-25 NOTE — Patient Instructions (Signed)
 We will schedule a follow-up in about 2 to 3 months  This will depend on when you get the inspire device placed and we want to see you about 4 weeks after that to talk about it and possibly activate the device  For now continue using your CPAP  We will try and get a download from the machine to see whether we can make any changes  Continue measures that may help facilitate good restorative sleep, make sure you are getting at least 7 hours of sleep  Call with significant concerns

## 2024-07-25 NOTE — Telephone Encounter (Signed)
 Can you sign this patient's DME order, please

## 2024-07-25 NOTE — Progress Notes (Addendum)
 "              Richard Mahoney    [id number]    1945/10/05  Primary Care Physician:Hunter, Garnette KIDD, MD  Referring Physician: Katrinka Garnette KIDD, MD [address] [place],  [place] [address]  Chief complaint:   Evaluation for sleep apnea  Discussed the use of AI scribe software for clinical note transcription with the patient, who gave verbal consent to proceed.  History of Present Illness Richard Mahoney is an 79 year old male with sleep apnea who presents for evaluation of treatment options. He was referred by Dr. Katrinka for evaluation of sleep apnea management options.  He has a history of sleep apnea, initially identified through a sleep study due to significant sleep disturbances, experiencing 30 to 40 apneic episodes per night. CPAP therapy has improved his sleep by reducing awakenings and nocturnal urination, but he struggles with air pressure, backflow pressure, and mask discomfort.  He was evaluated for an extended uvula. He has completed the necessary tests and is awaiting insurance approval for the [place] device.  He has a history of insomnia, which he attributes to sleep apnea. Before using CPAP, he would remain awake until 3 or 4 AM, but now falls asleep within 30 minutes. He has tried various sleep aids, including trazodone , which he discontinued due to side effects, and currently uses Primal Sleep.  He maintains an active lifestyle, exercises regularly, and drinks one cup of coffee daily. No significant issues with daytime sleepiness or driving. He has a history of owning a DME company and is familiar with sleep apnea treatments.  He is in communication with his cardiologist for preoperative clearance related to stopping blood thinners for the potential Inspire device implantation.  Continues to try to use CPAP Not tolerating it well Although, has noted more continuous sleep, able to fall asleep easily with CPAP on.  Difficulties include sensation of pressure, mask  issues  Evaluated for an inspire device Had a DISE procedure  He usually goes to bed about 1030 falls asleep in about 30 minutes 4-5 awakenings Final wake up time between 7 and 730 weight has remained stable  He is on auto CPAP 5-20 Usually very healthy with no difficulty tolerating activities   Outpatient Encounter Medications as of 07/25/2024  Medication Sig   atorvastatin  (LIPITOR) 40 MG tablet TAKE 1 TABLET BY MOUTH DAILY   chlorpheniramine (RA CHLORPHENIRAMINE MALEATE) 4 MG tablet    cholecalciferol (VITAMIN D3) 25 MCG (1000 UNIT) tablet Take 4,000 Units by mouth daily.   clopidogrel  (PLAVIX ) 75 MG tablet TAKE 1 TABLET BY MOUTH DAILY   cyanocobalamin  1000 MCG tablet Take 1,000 mcg by mouth daily.   EPINEPHrine  0.3 mg/0.3 mL IJ SOAJ injection Inject 0.3 mg into the muscle as needed for anaphylaxis.   nitroGLYCERIN  (NITROSTAT ) 0.4 MG SL tablet PLACE 1 TABLET UNDER THE TONGUE EVERY 5 MINUTES AS NEEDED FOR CHEST PAIN.   pantoprazole  (PROTONIX ) 40 MG tablet TAKE 1 TABLET BY MOUTH DAILY   polyvinyl alcohol  (LIQUIFILM TEARS) 1.4 % ophthalmic solution Place 1 drop into both eyes daily as needed for dry eyes.   sodium chloride  (OCEAN) 0.65 % SOLN nasal spray Place 1 spray into both nostrils daily as needed for congestion.   No facility-administered encounter medications on file as of 07/25/2024.    Allergies as of 07/25/2024 - Review Complete 07/25/2024  Allergen Reaction Noted   Trazodone  and nefazodone  03/12/2024   Sulfa antibiotics Rash 07/29/2013  Past Medical History:  Diagnosis Date   Allergy 1995   Cancer [facility]) Prostate cancer   removal surgery 08/11/2014   Cataract 2010 & 2013   Coronary artery disease    a. ETT 9/13: + ischemic ECG changes;  b. ETT-MV 9/13: EF 62%;  + ischemic EKG changes and + inf ischemia;  c. LHC 9/13: pLAD 30%, mLAD 70-80%, oDx 40-50% ==> FFR hemodynamically significant stenosis in mLAD ==> PCI: Xience Xpedition DES to mLAD; Cath 06/01/12   single  vessel CAD w/ patent mid LAD stent and normal LV systolic function   Fibromyalgia    muscle aches in past in both forearms, before statin even   GERD (gastroesophageal reflux disease)    Hyperlipidemia LDL goal < 70    Nephrolithiasis    Testosterone deficiency 04/28/2008   History treatment around 2009. No current treatment.       Past Surgical History:  Procedure Laterality Date   CARDIAC CATHETERIZATION  04/06/2012; 06/01/2012   CATARACT EXTRACTION W/ INTRAOCULAR LENS IMPLANT  08/12/2011   right .Left 12/2014.    CORONARY ANGIOPLASTY WITH STENT PLACEMENT  04/11/2012   DES LAD   CYSTOSCOPY W/ STONE MANIPULATION  07/12/2007   DRUG INDUCED ENDOSCOPY N/A 07/17/2024   Procedure: DRUG INDUCED SLEEP ENDOSCOPY;  Surgeon: Tobie Eldora NOVAK, MD;  Location: [facility];  Service: ENT;  Laterality: N/A;  DISE   EYE SURGERY  Cataract surgery   2010 & 2013   INGUINAL HERNIA REPAIR  03/11/1989   right   KNEE ARTHROSCOPY  03/11/1989   right   LEFT HEART CATHETERIZATION WITH CORONARY ANGIOGRAM N/A 06/01/2012   Procedure: LEFT HEART CATHETERIZATION WITH CORONARY ANGIOGRAM;  Surgeon: Lonni JONETTA Cash, MD;  [facility];  Service: Cardiovascular;  Laterality: N/A;   LYMPHADENECTOMY Bilateral 08/18/2014   Procedure: LYMPHADENECTOMY;  Surgeon: Gretel Ferrara, MD;  Location: [facility];  Service: Urology;  Laterality: Bilateral;   PERCUTANEOUS CORONARY STENT INTERVENTION (PCI-S) N/A 04/11/2012   Procedure: PERCUTANEOUS CORONARY STENT INTERVENTION (PCI-S);  Surgeon: Lonni JONETTA Cash, MD;  Location: [facility] CATH LAB;  Service: Cardiovascular;  Laterality: N/A;   ROBOT ASSISTED LAPAROSCOPIC RADICAL PROSTATECTOMY N/A 08/18/2014   Procedure: ROBOTIC ASSISTED LAPAROSCOPIC RADICAL PROSTATECTOMY LEVEL 2;  Surgeon: Gretel Ferrara, MD;  Location: [facility];  Service: Urology;  Laterality: N/A;    Family History  Problem Relation Age of Onset   Diabetes Mother    Heart attack Mother 25    Heart disease Mother    Varicose Veins Mother    Diabetes Father    Heart attack Father 69   Heart disease Father    Diabetes Sister    Arthritis Brother    Cancer Brother    Arthritis Brother     Social History   Socioeconomic History   Marital status: Married    Spouse name: Not on file   Number of children: 2   Years of education: Not on file   Highest education level: Master's degree (e.g., MA, MS, MEng, MEd, MSW, MBA)  Occupational History   Occupation: Retired  Tobacco Use   Smoking status: Never   Smokeless tobacco: Never  Substance and Sexual Activity   Alcohol  use: Never   Drug use: Never   Sexual activity: Not Currently    Comment: Complete removal of prostate in 2016.  Other Topics Concern   Not on file  Social History Narrative   Married (brother patient here, wife patient elsewhere). 2 children and 2 grandkids 4 and  2 in 2022 (daughter  in [place] with 2 kids, son in [place]-working with [employer]- administrator, sports).       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)   -wife retiring 2022      Hobbies: golf, work in yard, walking with wife   Social Drivers of Health   Tobacco Use: Low Risk (07/25/2024)   Patient History    Smoking Tobacco Use: Never    Smokeless Tobacco Use: Never    Passive Exposure: Not on file  Financial Resource Strain: Low Risk (04/27/2024)   Overall Financial Resource Strain (CARDIA)    Difficulty of Paying Living Expenses: Not hard at all  Food Insecurity: No Food Insecurity (07/01/2024)   Epic    Worried About Radiation Protection Practitioner of Food in the Last Year: Never true    Ran Out of Food in the Last Year: Never true  Transportation Needs: No Transportation Needs (07/01/2024)   Epic    Lack of Transportation (Medical): No    Lack of Transportation (Non-Medical): No  Physical Activity: Sufficiently Active (07/01/2024)   Exercise Vital Sign    Days of Exercise per Week: 5 days    Minutes  of Exercise per Session: 30 min  Stress: No Stress Concern Present (07/01/2024)   [employer] of Occupational Health - Occupational Stress Questionnaire    Feeling of Stress: Not at all  Social Connections: Socially Integrated (07/01/2024)   Social Connection and Isolation Panel    Frequency of Communication with Friends and Family: More than three times a week    Frequency of Social Gatherings with Friends and Family: More than three times a week    Attends Religious Services: More than 4 times per year    Active Member of Clubs or Organizations: Yes    Attends Banker Meetings: 1 to 4 times per year    Marital Status: Married  Catering Manager Violence: Not At Risk (07/01/2024)   Epic    Fear of Current or Ex-Partner: No    Emotionally Abused: No    Physically Abused: No    Sexually Abused: No  Depression (PHQ2-9): Low Risk (07/01/2024)   Depression (PHQ2-9)    PHQ-2 Score: 0  Recent Concern: Depression (PHQ2-9) - Medium Risk (06/24/2024)   Depression (PHQ2-9)    PHQ-2 Score: 6  Alcohol  Screen: Not on file  Housing: Unknown (07/01/2024)   Epic    Unable to Pay for Housing in the Last Year: No    Number of Times Moved in the Last Year: Not on file    Homeless in the Last Year: No  Utilities: Not At Risk (07/01/2024)   Epic    Threatened with loss of utilities: No  Health Literacy: Adequate Health Literacy (07/01/2024)   B1300 Health Literacy    Frequency of need for help with medical instructions: Never    Review of Systems  Constitutional:  Positive for fatigue.  Respiratory:  Positive for apnea.   Psychiatric/Behavioral:  Positive for sleep disturbance.     Vitals:   07/25/24 0826  BP: 132/82  Pulse: 75  Temp: (!) 97.3 F (36.3 C)  SpO2: 95%     Physical Exam Constitutional:      Appearance: Normal appearance.  HENT:     Head: Normocephalic.     Nose: Nose normal.     Mouth/Throat:     Mouth: Mucous membranes are moist.  Eyes:      General: No scleral  icterus. Cardiovascular:     Rate and Rhythm: Normal rate and regular rhythm.     Heart sounds: No murmur heard.    No friction rub.  Pulmonary:     Effort: No respiratory distress.     Breath sounds: No stridor. No wheezing or rhonchi.  Musculoskeletal:     Cervical back: No rigidity or tenderness.  Neurological:     Mental Status: He is alert.  Psychiatric:        Mood and Affect: Mood normal.   Epworth Sleepiness Scale of 9  Data Reviewed: Reviewed original sleep study which showed an AHI of 35.8-virtuox  Compliance download reviewed showing excellent compliance over the last 30 days of 97% average use of 7 hours 17 minutes AutoSet 11-2093 percentile pressure of 12.1    Assessment and Plan Assessment & Plan Obstructive sleep apnea O Bstructive sleep apnea with difficulty tolerating CPAP due to air pressure and mask issues. Passed Inspire device eligibility tests, including endoscopy and sleep study. Inspire device expected to reduce apneic events by approximately 50%, from 35.8 to around 15 events per night. Inspire device is a viable option for those intolerant to CPAP. Risks include that device may not be effective for everyone. Inspire device activation delayed for four weeks post-implantation to allow healing. BiPAP may be considered if CPAP remains intolerable, pending sleep study for titration. - Proceed with Inspire device implantation. - Continue CPAP use until Inspire is activated. - Will schedule follow-up in three months post-Inspire activation.  Insomnia Chronic insomnia likely related to obstructive sleep apnea. Difficulty falling asleep without CPAP, but improved sleep with CPAP use. Previous use of trazodone  resulted in constipation and dry eyes. Currently using a supplement with melatonin and lemon balm. Cognitive behavioral therapy for insomnia discussed as a non-pharmacological option. Inspire device may improve sleep quality by reducing  apneic events, but insomnia may persist and require additional management. - Continue current sleep supplement regimen. - Will consider cognitive behavioral therapy for insomnia if sleep issues persist post-Inspire activation. - Will evaluate need for additional sleep medications if insomnia persists.   Following reviewing the compliance report Will send DME order to tighten pressures to 8-13  Appointment already made for 2 to 3 months  Will try and get another download in about a month   Jennet Epley MD [facility] 07/25/2024, 9:37 AM  CC: Katrinka Garnette KIDD, MD   "

## 2024-07-25 NOTE — Addendum Note (Signed)
 Addended by: Tyreque Finken M on: 07/25/2024 11:31 AM   Modules accepted: Orders

## 2024-07-26 NOTE — Telephone Encounter (Signed)
 Pre-op covering team, can we please get this lady rescheduled?  Thank you! Richard Mahoney

## 2024-07-29 NOTE — Telephone Encounter (Signed)
 Need signature for patient's DME order, please

## 2024-07-31 ENCOUNTER — Ambulatory Visit: Attending: Cardiology | Admitting: Student

## 2024-07-31 DIAGNOSIS — Z01810 Encounter for preprocedural cardiovascular examination: Secondary | ICD-10-CM | POA: Diagnosis not present

## 2024-07-31 NOTE — Progress Notes (Signed)
 "   Virtual Visit via Telephone Note   Because of Richard Mahoney's co-morbid illnesses, he is at least at moderate risk for complications without adequate follow up.  This format is felt to be most appropriate for this patient at this time.  The patient did not have access to video technology/had technical difficulties with video requiring transitioning to audio format only (telephone).  All issues noted in this document were discussed and addressed.  No physical exam could be performed with this format.  Please refer to the patient's chart for his consent to telehealth for Richard Mahoney.  Evaluation Performed:  Preoperative cardiovascular risk assessment _____________   Date:  07/31/2024   Patient ID:  Richard Mahoney, DOB 13-Jun-1946, MRN [id number] Patient Location:  Home Provider location:   Office  Primary Care Provider:  Katrinka Garnette KIDD, MD  Primary Cardiologist:  [facility] Providers Cardiologist:  Lonni Cash, MD   Chief Complaint / Patient Profile   79 y.o. y/o male with a h/o CAD s/p PCI with DES to LAD September 2013, hyperlipidemia, GERD, prostate cancer s/p radical prostatectomy who is pending right hypoglossal nerve stimulator by Dr. Tobie and presents today for telephonic preoperative cardiovascular risk assessment.  History of Present Illness    Richard Mahoney is a 79 y.o. male who presents via audio/video conferencing for a telehealth visit today.  Pt was last seen in cardiology clinic on 08/29/2023 by Dr. Cash.  At that time Richard Mahoney was stable from a cardiac standpoint .  The patient is now pending procedure as outlined above. Since his last visit, he is doing well. Patient denies shortness of breath, dyspnea on exertion, lower extremity edema, orthopnea or PND. No chest pain, pressure, or tightness. No palpitations. He is not experiencing lightheadedness, dizziness, presyncope or syncope. He is very active walking on the  treadmill for 40 minutes 5-7 days a week.   Past Medical History    Past Medical History:  Diagnosis Date   Allergy 1995   Cancer [facility]) Prostate cancer   removal surgery 08/11/2014   Cataract 2010 & 2013   Coronary artery disease    a. ETT 9/13: + ischemic ECG changes;  b. ETT-MV 9/13: EF 62%;  + ischemic EKG changes and + inf ischemia;  c. LHC 9/13: pLAD 30%, mLAD 70-80%, oDx 40-50% ==> FFR hemodynamically significant stenosis in mLAD ==> PCI: Xience Xpedition DES to mLAD; Cath 06/01/12   single vessel CAD w/ patent mid LAD stent and normal LV systolic function   Fibromyalgia    muscle aches in past in both forearms, before statin even   GERD (gastroesophageal reflux disease)    Hyperlipidemia LDL goal < 70    Nephrolithiasis    Testosterone deficiency 04/28/2008   History treatment around 2009. No current treatment.      Past Surgical History:  Procedure Laterality Date   CARDIAC CATHETERIZATION  04/06/2012; 06/01/2012   CATARACT EXTRACTION W/ INTRAOCULAR LENS IMPLANT  08/12/2011   right .Left 12/2014.    CORONARY ANGIOPLASTY WITH STENT PLACEMENT  04/11/2012   DES LAD   CYSTOSCOPY W/ STONE MANIPULATION  07/12/2007   DRUG INDUCED ENDOSCOPY N/A 07/17/2024   Procedure: DRUG INDUCED SLEEP ENDOSCOPY;  Surgeon: Tobie Eldora NOVAK, MD;  Location: [facility];  Service: ENT;  Laterality: N/A;  DISE   EYE SURGERY  Cataract surgery   2010 & 2013   INGUINAL HERNIA REPAIR  03/11/1989   right   KNEE ARTHROSCOPY  03/11/1989   right   LEFT HEART CATHETERIZATION WITH CORONARY ANGIOGRAM N/A 06/01/2012   Procedure: LEFT HEART CATHETERIZATION WITH CORONARY ANGIOGRAM;  Surgeon: Lonni JONETTA Cash, MD;  Location: [facility] CATH LAB;  Service: Cardiovascular;  Laterality: N/A;   LYMPHADENECTOMY Bilateral 08/18/2014   Procedure: LYMPHADENECTOMY;  Surgeon: Gretel Ferrara, MD;  Location: [facility];  Service: Urology;  Laterality: Bilateral;   PERCUTANEOUS CORONARY STENT INTERVENTION (PCI-S) N/A  04/11/2012   Procedure: PERCUTANEOUS CORONARY STENT INTERVENTION (PCI-S);  Surgeon: Lonni JONETTA Cash, MD;  Location[facility] LAB;  Service: Cardiovascular;  Laterality: N/A;   ROBOT ASSISTED LAPAROSCOPIC RADICAL PROSTATECTOMY N/A 08/18/2014   Procedure: ROBOTIC ASSISTED LAPAROSCOPIC RADICAL PROSTATECTOMY LEVEL 2;  Surgeon: Gretel Ferrara, MD;  Location: [facility];  Service: Urology;  Laterality: N/A;    Allergies  Allergies[1]  Home Medications    Prior to Admission medications  Medication Sig Start Date End Date Taking? Authorizing Provider  atorvastatin  (LIPITOR) 40 MG tablet TAKE 1 TABLET BY MOUTH DAILY 07/22/24   Katrinka Garnette KIDD, MD  chlorpheniramine (RA CHLORPHENIRAMINE MALEATE) 4 MG tablet     [provider]  cholecalciferol (VITAMIN D3) 25 MCG (1000 UNIT) tablet Take 4,000 Units by mouth daily.    [provider]  clopidogrel  (PLAVIX ) 75 MG tablet TAKE 1 TABLET BY MOUTH DAILY 05/21/24   Cash Lonni JONETTA, MD  cyanocobalamin  1000 MCG tablet Take 1,000 mcg by mouth daily.    [provider]  EPINEPHrine  0.3 mg/0.3 mL IJ SOAJ injection Inject 0.3 mg into the muscle as needed for anaphylaxis. 01/31/23   Katrinka Garnette KIDD, MD  nitroGLYCERIN  (NITROSTAT ) 0.4 MG SL tablet PLACE 1 TABLET UNDER THE TONGUE EVERY 5 MINUTES AS NEEDED FOR CHEST PAIN. 02/17/22   Cash Lonni JONETTA, MD  pantoprazole  (PROTONIX ) 40 MG tablet TAKE 1 TABLET BY MOUTH DAILY 05/21/24   Katrinka Garnette KIDD, MD  polyvinyl alcohol  (LIQUIFILM TEARS) 1.4 % ophthalmic solution Place 1 drop into both eyes daily as needed for dry eyes.    [provider]  sodium chloride  (OCEAN) 0.65 % SOLN nasal spray Place 1 spray into both nostrils daily as needed for congestion.    [provider]    Physical Exam    Vital Signs:  Richard Mahoney does not have vital signs available for review today.  Given telephonic nature of communication, physical exam is limited. AAOx3. NAD.  Normal affect.  Speech and respirations are unlabored.   Assessment & Plan    Preoperative cardiovascular risk assessment. Right hypoglossal nerve stimulator placement Dr. Tobie   Chart reviewed as part of pre-operative protocol coverage. According to the RCRI, patient has a 0.9% risk of MACE. Patient reports activity equivalent to >4.0 METS (walks on treadmill 40 minutes 5-7 days a week).   Given past medical history and time since last visit, based on ACC/AHA guidelines, Richard Mahoney would be at acceptable risk for the planned procedure without further cardiovascular testing.   Patient was advised that if he develops new symptoms prior to surgery to contact our office to arrange a follow-up appointment.  he verbalized understanding.  Per office protocol, he may hold Plavix  for 5 days prior to procedure and should resume as soon as hemodynamically stable postoperatively.   I will route this recommendation to the requesting party via Epic fax function.  Please call with questions.  Time:   Today, I have spent 5 minutes with the patient with telehealth technology discussing medical history, symptoms, and management plan.  Barnie Hila, NP  07/31/2024, 10:26 AM     [1]  Allergies Allergen Reactions   Trazodone  And Nefazodone     Severe constipation and severe dry eyes   Sulfa Antibiotics Rash    rash   "

## 2024-08-02 ENCOUNTER — Telehealth (INDEPENDENT_AMBULATORY_CARE_PROVIDER_SITE_OTHER): Payer: Self-pay | Admitting: Otolaryngology

## 2024-08-02 NOTE — Telephone Encounter (Signed)
 [name] called on 08/02/2024 at 1:01 pm he said Dr. Tobie said he was qualified for the [facility] and he wanted to know if the procedure is approved  and where does he stand with the insurance coverage?

## 2024-08-07 ENCOUNTER — Telehealth (INDEPENDENT_AMBULATORY_CARE_PROVIDER_SITE_OTHER): Payer: Self-pay

## 2024-08-07 NOTE — Telephone Encounter (Signed)
 Patient called wanting to get scheduled for surgery. I spoke with Powell [employer]urdue about getting him scheduled and she explained that Dr. Anthony surgery schedule has not been opened yet. I let the patient know this and I let him know that Dr. Tobie is currently out of town. I told him that this should be fixed when Dr. Tobie returns. Patient understood. I let him know that someone will give him a call once the schedule is opened.

## 2024-08-14 ENCOUNTER — Telehealth (INDEPENDENT_AMBULATORY_CARE_PROVIDER_SITE_OTHER): Payer: Self-pay

## 2024-08-14 ENCOUNTER — Other Ambulatory Visit: Payer: Self-pay

## 2024-08-14 ENCOUNTER — Encounter (HOSPITAL_COMMUNITY): Payer: Self-pay

## 2024-08-14 ENCOUNTER — Inpatient Hospital Stay (HOSPITAL_COMMUNITY)
Admission: RE | Admit: 2024-08-14 | Discharge: 2024-08-14 | Disposition: A | Source: Ambulatory Visit | Attending: Otolaryngology

## 2024-08-14 VITALS — BP 141/83 | HR 78 | Temp 97.6°F | Resp 18 | Ht 72.5 in | Wt 200.5 lb

## 2024-08-14 DIAGNOSIS — I2510 Atherosclerotic heart disease of native coronary artery without angina pectoris: Secondary | ICD-10-CM

## 2024-08-14 DIAGNOSIS — Z01818 Encounter for other preprocedural examination: Secondary | ICD-10-CM

## 2024-08-14 HISTORY — DX: Sleep apnea, unspecified: G47.30

## 2024-08-14 LAB — CBC
HCT: 41.8 % (ref 39.0–52.0)
Hemoglobin: 14.2 g/dL (ref 13.0–17.0)
MCH: 31.2 pg (ref 26.0–34.0)
MCHC: 34 g/dL (ref 30.0–36.0)
MCV: 91.9 fL (ref 80.0–100.0)
Platelets: 226 10*3/uL (ref 150–400)
RBC: 4.55 MIL/uL (ref 4.22–5.81)
RDW: 11.9 % (ref 11.5–15.5)
WBC: 4.7 10*3/uL (ref 4.0–10.5)
nRBC: 0 % (ref 0.0–0.2)

## 2024-08-14 LAB — BASIC METABOLIC PANEL WITH GFR
Anion gap: 8 (ref 5–15)
BUN: 21 mg/dL (ref 8–23)
CO2: 29 mmol/L (ref 22–32)
Calcium: 8.9 mg/dL (ref 8.9–10.3)
Chloride: 104 mmol/L (ref 98–111)
Creatinine, Ser: 1.03 mg/dL (ref 0.61–1.24)
GFR, Estimated: >60 mL/min
Glucose, Bld: 109 mg/dL — ABNORMAL HIGH (ref 70–99)
Potassium: 4.6 mmol/L (ref 3.5–5.1)
Sodium: 140 mmol/L (ref 135–145)

## 2024-08-14 NOTE — Telephone Encounter (Signed)
 Spoke to patient and informed patient to hold Plavix  5 days prior to surgery. Patient verbalized understanding.

## 2024-08-14 NOTE — Progress Notes (Signed)
 PCP - Katrinka Garnette KIDD, MD  Cardiologist - Verlin Lonni BIRCH, MD   PPM/ICD - denies  Chest x-ray - NA EKG - 08/29/23 Stress Test - 04/02/12 ECHO - 09/28/22 Cardiac Cath - 2013  Sleep Study - uses CPAP- pressure settings- auto 5-13  Fasting Blood Sugar - NA  Last dose of GLP1 agonist-  denies   Blood Thinner Instructions: Plavix - 08/13/24 last dose Aspirin  Instructions: NA  ERAS Protcol - ERAS per protocol- no orders   COVID TEST- NA  Nitroglycerin - has never used  Anesthesia review: yes- hx CAD  Patient denies shortness of breath, fever, cough and chest pain at PAT appointment   All instructions explained to the patient, with a verbal understanding of the material. Patient agrees to go over the instructions while at home for a better understanding.  The opportunity to ask questions was provided.

## 2024-08-15 NOTE — Progress Notes (Signed)
 Anesthesia Chart Review:  Case: [id number] Date/Time: 08/19/24 1141   Procedure: INSERTION, HYPOGLOSSAL NERVE STIMULATOR - Inspire V   Anesthesia type: General   Diagnosis: Obstructive sleep apnea (adult) (pediatric) [G47.33]   Pre-op diagnosis: Obstructive sleep apnea   [facility]: [facility] ROOM 12 / [facility]   Surgeons: Tobie Eldora NOVAK, MD       DISCUSSION: Patient is an 79 year old male scheduled for the above procedure. S/p DISE on 07/17/2024.  History includes never smoker, CAD (DES mid LAD 04/11/2012), HLD, OSA (uses CPAP), GERD, fibromyalgia, prostate cancer (s/p robotic assisted laparoscopic radical prostatectomy 08/18/2014).   Preoperative cardiology input on 08/01/2023 by Loistine Sober, NP. He denied CV symptoms and was walking on his treadmill 40 minutes 5-7 days/week. She wrote, Preoperative cardiovascular risk assessment. Right hypoglossal nerve stimulator placement Dr. Tobie    Chart reviewed as part of pre-operative protocol coverage. According to the RCRI, patient has a 0.9% risk of MACE. Patient reports activity equivalent to >4.0 METS (walks on treadmill 40 minutes 5-7 days a week).    Given past medical history and time since last visit, based on ACC/AHA guidelines, Richard Mahoney would be at acceptable risk for the planned procedure without further cardiovascular testing....  Per office protocol, he may hold Plavix  for 5 days prior to procedure and should resume as soon as hemodynamically stable postoperatively.  Last Plavix  reported as 08/13/2024.  Anesthesia team to evaluate on the day of surgery.   VS: BP (!) 141/83   Pulse 78   Temp 36.4 C   Resp 18   Ht 6' 0.5 (1.842 m)   Wt 90.9 kg   SpO2 98%   BMI 26.82 kg/m   PROVIDERS: Katrinka Garnette KIDD, MD is PCP  Verlin Bruckner, MD is cardiologist Neda Hammond, MD is pulmonologist Tobie Eldora, MD is ENT  LABS: Labs reviewed: Acceptable for surgery. (all labs ordered are listed, but only abnormal results  are displayed)  Labs Reviewed  BASIC METABOLIC PANEL WITH GFR - Abnormal; Notable for the following components:      Result Value   Glucose, Bld 109 (*)    All other components within normal limits  CBC     IMAGES: Last CXR was > 1 year ago in setting of flu.   EKG: 08/29/2023: Sinus rhythm with Premature supraventricular complexes Confirmed by Verlin Bruckner [phone]) on 08/29/2023 3:26:58 PM  CV: Echo 09/28/2022: IMPRESSIONS   1. GLS -13.8. Left ventricular ejection fraction, by estimation, is 60 to  65%. The left ventricle has normal function. The left ventricle has no  regional wall motion abnormalities. Left ventricular diastolic parameters  are consistent with Grade I  diastolic dysfunction (impaired relaxation).   2. Right ventricular systolic function is normal. The right ventricular  size is normal. There is normal pulmonary artery systolic pressure.   3. The mitral valve is normal in structure. Trivial mitral valve  regurgitation. No evidence of mitral stenosis.   4. The aortic valve is calcified. There is mild calcification of the  aortic valve. There is mild thickening of the aortic valve. Aortic valve  regurgitation is not visualized. No aortic stenosis is present.   5. The inferior vena cava is normal in size with greater than 50%  respiratory variability, suggesting right atrial pressure of 3 mmHg.    Cardiac cath 06/01/2012: Angiographic Findings: Left main: No obstructive disease noted.  Left Anterior Descending Artery: Moderate caliber vessel that courses to the apex. The proximal vessel has a  30% stenosis. The mid vessel has a patent stented segment with no evidence of restenosis. The distal LAD has no obstructive disease. The diagonal branch is covered by the LAD stent and has a 10% ostial stenosis. There is excellent flow down the diagonal branch.  Intermediate Artery: Small caliber vessel with 30% stenosis.  Circumflex Artery: Large caliber,  co-dominant vessel with no obstructive disease noted. There are three small caliber obtuse marginal branches that have no obstructive disease noted.  Right Coronary Artery: Small caliber, co-dominant vessel with mild plaque in the proximal and mid segments.  Left Ventricular Angiogram: LVEF=60-65% Impression: 1. Single vessel CAD with patent stent mid LAD, excellent flow down diagonal branch 2. Normal LV systolic function 3. Most likely non-cardiac chest pain. This may be related to GERD or microvascular angina.  Recommendations: Will treat GERD by restarting PPI. Since I am restarting PPI, will d/c Plavix  and will start Effient  10 mg po Qdaily. Will load with Effient  60 mg po x 1 today. Continue ASA and statin. Will start Imdur  for possible small vessel disease. No beta blocker secondary to bradycardia. Will monitor overnight. D/C in am if stable.   Past Medical History:  Diagnosis Date   Allergy 1995   Cancer [facility]) Prostate cancer   removal surgery 08/11/2014   Cataract 2010 & 2013   Coronary artery disease    a. ETT 9/13: + ischemic ECG changes;  b. ETT-MV 9/13: EF 62%;  + ischemic EKG changes and + inf ischemia;  c. LHC 9/13: pLAD 30%, mLAD 70-80%, oDx 40-50% ==> FFR hemodynamically significant stenosis in mLAD ==> PCI: Xience Xpedition DES to mLAD; Cath 06/01/12   single vessel CAD w/ patent mid LAD stent and normal LV systolic function   Fibromyalgia    muscle aches in past in both forearms, before statin even   GERD (gastroesophageal reflux disease)    Hyperlipidemia LDL goal < 70    Nephrolithiasis    Sleep apnea    Testosterone deficiency 04/28/2008   History treatment around 2009. No current treatment.       Past Surgical History:  Procedure Laterality Date   CARDIAC CATHETERIZATION  04/06/2012; 06/01/2012   CATARACT EXTRACTION W/ INTRAOCULAR LENS IMPLANT  08/12/2011   right .Left 12/2014.    CORONARY ANGIOPLASTY WITH STENT PLACEMENT  04/11/2012   DES LAD   CYSTOSCOPY W/  STONE MANIPULATION  07/12/2007   DRUG INDUCED ENDOSCOPY N/A 07/17/2024   Procedure: DRUG INDUCED SLEEP ENDOSCOPY;  Surgeon: Tobie Eldora NOVAK, MD;  Location: [facility];  Service: ENT;  Laterality: N/A;  DISE   EYE SURGERY  Cataract surgery   2010 & 2013   INGUINAL HERNIA REPAIR  03/11/1989   right   KNEE ARTHROSCOPY  03/11/1989   right   LEFT HEART CATHETERIZATION WITH CORONARY ANGIOGRAM N/A 06/01/2012   Procedure: LEFT HEART CATHETERIZATION WITH CORONARY ANGIOGRAM;  Surgeon: Lonni JONETTA Cash, MD;  Location[facility] CATH LAB;  Service: Cardiovascular;  Laterality: N/A;   LYMPHADENECTOMY Bilateral 08/18/2014   Procedure: LYMPHADENECTOMY;  Surgeon: Gretel Ferrara, MD;  Location: [facility] ORS;  Service: Urology;  Laterality: Bilateral;   PERCUTANEOUS CORONARY STENT INTERVENTION (PCI-S) N/A 04/11/2012   Procedure: PERCUTANEOUS CORONARY STENT INTERVENTION (PCI-S);  Surgeon: Lonni JONETTA Cash, MD;  Location: [facility] CATH LAB;  Service: Cardiovascular;  Laterality: N/A;   ROBOT ASSISTED LAPAROSCOPIC RADICAL PROSTATECTOMY N/A 08/18/2014   Procedure: ROBOTIC ASSISTED LAPAROSCOPIC RADICAL PROSTATECTOMY LEVEL 2;  Surgeon: Gretel Ferrara, MD;  Location: [facility];  Service: Urology;  Laterality: N/A;    MEDICATIONS:  atorvastatin  (LIPITOR) 40 MG tablet   chlorpheniramine (CHLOR-TRIMETON) 4 MG tablet   Cholecalciferol (VITAMIN D ) 50 MCG (2000 UT) CAPS   clopidogrel  (PLAVIX ) 75 MG tablet   cyanocobalamin  (VITAMIN B12) 1000 MCG tablet   EPINEPHrine  0.3 mg/0.3 mL IJ SOAJ injection   melatonin 3 MG TABS tablet   nitroGLYCERIN  (NITROSTAT ) 0.4 MG SL tablet   pantoprazole  (PROTONIX ) 40 MG tablet   polyvinyl alcohol  (LIQUIFILM TEARS) 1.4 % ophthalmic solution   sodium chloride  (OCEAN) 0.65 % SOLN nasal spray   No current facility-administered medications for this encounter.    Isaiah Ruder, PA-C Surgical Short Stay/Anesthesiology [facility] Phone [phone] [facility] Phone [phone] 08/15/2024 1:09  PM

## 2024-08-15 NOTE — Anesthesia Preprocedure Evaluation (Signed)
"                                    Anesthesia Evaluation    Airway        Dental   Pulmonary           Cardiovascular      Neuro/Psych    GI/Hepatic   Endo/Other    Renal/GU      Musculoskeletal   Abdominal   Peds  Hematology   Anesthesia Other Findings   Reproductive/Obstetrics                              Anesthesia Physical Anesthesia Plan  ASA:   Anesthesia Plan:    Post-op Pain Management:    Induction:   PONV Risk Score and Plan:   Airway Management Planned:   Additional Equipment:   Intra-op Plan:   Post-operative Plan:   Informed Consent:   Plan Discussed with:   Anesthesia Plan Comments: (PAT note written 08/15/2024 by Branon Sabine, PA-C.  )        Anesthesia Quick Evaluation  "

## 2024-08-19 ENCOUNTER — Ambulatory Visit (HOSPITAL_COMMUNITY): Admission: RE | Admit: 2024-08-19 | Admitting: Otolaryngology

## 2024-08-19 ENCOUNTER — Encounter: Admission: RE | Payer: Self-pay

## 2024-08-19 ENCOUNTER — Encounter (HOSPITAL_COMMUNITY): Payer: Self-pay | Admitting: Vascular Surgery

## 2024-08-26 ENCOUNTER — Encounter (INDEPENDENT_AMBULATORY_CARE_PROVIDER_SITE_OTHER): Admitting: Otolaryngology

## 2024-09-25 ENCOUNTER — Ambulatory Visit: Admitting: Cardiovascular Disease

## 2024-10-31 ENCOUNTER — Ambulatory Visit: Admitting: Pulmonary Disease

## 2025-06-24 ENCOUNTER — Encounter: Admitting: Family Medicine
